# Patient Record
Sex: Female | Born: 1975 | Race: White | Hispanic: No | Marital: Single | Smoking: Current every day smoker
Health system: Southern US, Community
[De-identification: ages and names within clinical notes are randomized; demographics above are authoritative.]

## PROBLEM LIST (undated history)

## (undated) DIAGNOSIS — F419 Anxiety disorder, unspecified: Secondary | ICD-10-CM

## (undated) DIAGNOSIS — R768 Other specified abnormal immunological findings in serum: Secondary | ICD-10-CM

## (undated) DIAGNOSIS — B001 Herpesviral vesicular dermatitis: Secondary | ICD-10-CM

## (undated) DIAGNOSIS — F32A Depression, unspecified: Secondary | ICD-10-CM

## (undated) DIAGNOSIS — D649 Anemia, unspecified: Secondary | ICD-10-CM

## (undated) DIAGNOSIS — G43909 Migraine, unspecified, not intractable, without status migrainosus: Secondary | ICD-10-CM

## (undated) DIAGNOSIS — K219 Gastro-esophageal reflux disease without esophagitis: Secondary | ICD-10-CM

## (undated) DIAGNOSIS — N390 Urinary tract infection, site not specified: Secondary | ICD-10-CM

## (undated) DIAGNOSIS — F329 Major depressive disorder, single episode, unspecified: Secondary | ICD-10-CM

## (undated) HISTORY — DX: Migraine, unspecified, not intractable, without status migrainosus: G43.909

## (undated) HISTORY — DX: Urinary tract infection, site not specified: N39.0

## (undated) HISTORY — PX: COLONOSCOPY: SHX174

## (undated) HISTORY — PX: WRIST SURGERY: SHX841

## (undated) HISTORY — DX: Herpesviral vesicular dermatitis: B00.1

## (undated) HISTORY — PX: UPPER GASTROINTESTINAL ENDOSCOPY: SHX188

## (undated) HISTORY — DX: Depression, unspecified: F32.A

## (undated) HISTORY — DX: Other specified abnormal immunological findings in serum: R76.8

## (undated) HISTORY — DX: Major depressive disorder, single episode, unspecified: F32.9

---

## 2005-01-04 ENCOUNTER — Emergency Department (HOSPITAL_COMMUNITY): Admission: EM | Admit: 2005-01-04 | Discharge: 2005-01-04 | Payer: Self-pay | Admitting: Emergency Medicine

## 2013-07-15 ENCOUNTER — Ambulatory Visit: Payer: Self-pay | Admitting: Internal Medicine

## 2013-09-30 ENCOUNTER — Other Ambulatory Visit (INDEPENDENT_AMBULATORY_CARE_PROVIDER_SITE_OTHER): Payer: No Typology Code available for payment source

## 2013-09-30 ENCOUNTER — Encounter: Payer: Self-pay | Admitting: Internal Medicine

## 2013-09-30 ENCOUNTER — Ambulatory Visit (INDEPENDENT_AMBULATORY_CARE_PROVIDER_SITE_OTHER): Payer: No Typology Code available for payment source | Admitting: Internal Medicine

## 2013-09-30 VITALS — BP 120/80 | HR 75 | Temp 98.8°F | Ht 70.0 in | Wt 144.0 lb

## 2013-09-30 DIAGNOSIS — Z0000 Encounter for general adult medical examination without abnormal findings: Secondary | ICD-10-CM

## 2013-09-30 DIAGNOSIS — F329 Major depressive disorder, single episode, unspecified: Secondary | ICD-10-CM

## 2013-09-30 DIAGNOSIS — B001 Herpesviral vesicular dermatitis: Secondary | ICD-10-CM

## 2013-09-30 DIAGNOSIS — F32A Depression, unspecified: Secondary | ICD-10-CM

## 2013-09-30 DIAGNOSIS — B009 Herpesviral infection, unspecified: Secondary | ICD-10-CM

## 2013-09-30 DIAGNOSIS — F3289 Other specified depressive episodes: Secondary | ICD-10-CM

## 2013-09-30 DIAGNOSIS — G43909 Migraine, unspecified, not intractable, without status migrainosus: Secondary | ICD-10-CM | POA: Insufficient documentation

## 2013-09-30 DIAGNOSIS — R768 Other specified abnormal immunological findings in serum: Secondary | ICD-10-CM

## 2013-09-30 DIAGNOSIS — F419 Anxiety disorder, unspecified: Secondary | ICD-10-CM | POA: Insufficient documentation

## 2013-09-30 DIAGNOSIS — Z0001 Encounter for general adult medical examination with abnormal findings: Secondary | ICD-10-CM | POA: Insufficient documentation

## 2013-09-30 HISTORY — DX: Herpesviral vesicular dermatitis: B00.1

## 2013-09-30 HISTORY — DX: Other specified abnormal immunological findings in serum: R76.8

## 2013-09-30 LAB — CBC WITH DIFFERENTIAL/PLATELET
Basophils Absolute: 0.1 10*3/uL (ref 0.0–0.1)
Basophils Relative: 1.2 % (ref 0.0–3.0)
Eosinophils Absolute: 0.2 10*3/uL (ref 0.0–0.7)
Eosinophils Relative: 3.5 % (ref 0.0–5.0)
HCT: 39.5 % (ref 36.0–46.0)
Hemoglobin: 13.2 g/dL (ref 12.0–15.0)
Lymphocytes Relative: 27.5 % (ref 12.0–46.0)
Lymphs Abs: 1.5 10*3/uL (ref 0.7–4.0)
MCHC: 33.5 g/dL (ref 30.0–36.0)
MCV: 93.3 fl (ref 78.0–100.0)
Monocytes Absolute: 0.7 10*3/uL (ref 0.1–1.0)
Monocytes Relative: 12.5 % — ABNORMAL HIGH (ref 3.0–12.0)
Neutro Abs: 3 10*3/uL (ref 1.4–7.7)
Neutrophils Relative %: 55.3 % (ref 43.0–77.0)
Platelets: 205 10*3/uL (ref 150.0–400.0)
RBC: 4.23 Mil/uL (ref 3.87–5.11)
RDW: 14 % (ref 11.5–15.5)
WBC: 5.4 10*3/uL (ref 4.0–10.5)

## 2013-09-30 LAB — URINALYSIS, ROUTINE W REFLEX MICROSCOPIC
Bilirubin Urine: NEGATIVE
Hgb urine dipstick: NEGATIVE
Ketones, ur: NEGATIVE
Leukocytes, UA: NEGATIVE
Nitrite: NEGATIVE
Specific Gravity, Urine: 1.02 (ref 1.000–1.030)
Urine Glucose: NEGATIVE
Urobilinogen, UA: 1 (ref 0.0–1.0)
pH: 7 (ref 5.0–8.0)

## 2013-09-30 LAB — BASIC METABOLIC PANEL
BUN: 14 mg/dL (ref 6–23)
CO2: 28 mEq/L (ref 19–32)
Calcium: 8.9 mg/dL (ref 8.4–10.5)
Chloride: 104 mEq/L (ref 96–112)
Creatinine, Ser: 0.6 mg/dL (ref 0.4–1.2)
GFR: 117.06 mL/min (ref >60.00)
Glucose, Bld: 71 mg/dL (ref 70–99)
Potassium: 4.9 mEq/L (ref 3.5–5.1)
Sodium: 138 mEq/L (ref 135–145)

## 2013-09-30 LAB — TSH: TSH: 0.91 u[IU]/mL (ref 0.35–4.50)

## 2013-09-30 LAB — LIPID PANEL
Cholesterol: 205 mg/dL — ABNORMAL HIGH (ref 0–200)
HDL: 78.9 mg/dL (ref >39.00)
LDL Cholesterol: 101 mg/dL — ABNORMAL HIGH (ref 0–99)
Total CHOL/HDL Ratio: 3
Triglycerides: 124 mg/dL (ref 0.0–149.0)
VLDL: 24.8 mg/dL (ref 0.0–40.0)

## 2013-09-30 LAB — HEPATIC FUNCTION PANEL
ALT: 12 U/L (ref 0–35)
AST: 17 U/L (ref 0–37)
Albumin: 4.1 g/dL (ref 3.5–5.2)
Alkaline Phosphatase: 47 U/L (ref 39–117)
Bilirubin, Direct: 0.1 mg/dL (ref 0.0–0.3)
Total Bilirubin: 0.2 mg/dL (ref 0.2–1.2)
Total Protein: 6.9 g/dL (ref 6.0–8.3)

## 2013-09-30 MED ORDER — VALACYCLOVIR HCL 1 G PO TABS: 1000.0000 mg | ORAL_TABLET | Freq: Two times a day (BID) | ORAL | Status: DC

## 2013-09-30 NOTE — Patient Instructions (Addendum)
Please take all new medication as prescribed - the valtrex  Please continue all other medications as before, and refills have been done if requested.  Please have the pharmacy call with any other refills you may need.  Please continue your efforts at being more active, low cholesterol diet, and weight control.  You are otherwise up to date with prevention measures today.  You will be contacted regarding the referral for: Counseling  Please call if you feel you need medication for low mood, such as lexapro  Please go to the LAB in the Basement (turn left off the elevator) for the tests to be done today  You will be contacted by phone if any changes need to be made immediately.  Otherwise, you will receive a letter about your results with an explanation, but please check with MyChart first.  Please remember to sign up for MyChart if you have not done so, as this will be important to you in the future with finding out test results, communicating by private email, and scheduling acute appointments online when needed.  Please return in 1 year for your yearly visit, or sooner if needed, with Lab testing done 3-5 days before

## 2013-09-30 NOTE — Progress Notes (Signed)
Pre visit review using our clinic review tool, if applicable. No additional management support is needed unless otherwise documented below in the visit note. 

## 2013-09-30 NOTE — Progress Notes (Signed)
Subjective:    Patient ID: Amanda Murray, female    DOB: July 04, 1975, 38 y.o.   MRN: [id number]  HPI    Here for wellness and f/u;  Overall doing ok;  Pt denies CP, worsening SOB, DOE, wheezing, orthopnea, PND, worsening LE edema, palpitations, dizziness or syncope.  Pt denies neurological change such as new headache, facial or extremity weakness.  Pt denies polydipsia, polyuria, or low sugar symptoms. Pt states overall good compliance with treatment and medications, good tolerability, and has been trying to follow lower cholesterol diet.  Pt denies worsening depressive symptoms, suicidal ideation or panic. No fever, night sweats, wt loss, loss of appetite, or other constitutional symptoms.  Pt states good ability with ADL's, has low fall risk, home safety reviewed and adequate, no other significant changes in hearing or vision.  Has had mild worsening depressive symptoms, but no suicidal ideation, or panic; has ongoing anxiety, not increased recently. Asks for counseling tx, declines meds for now.  Was tx for gastritis H pylori 7 yrs ago Past Medical History  Diagnosis Date  . Depression   . Migraines   . UTI (lower urinary tract infection)    No past surgical history on file.  reports that she has been smoking.  She does not have any smokeless tobacco history on file. She reports that she drinks alcohol. She reports that she does not use illicit drugs. family history includes Alcohol abuse in her other; Diabetes in her other; Hypertension in her other; Mental illness in her other; Sudden death in her other. No Known Allergies No current outpatient prescriptions on file prior to visit.   No current facility-administered medications on file prior to visit.    Review of Systems Constitutional: Negative for increased diaphoresis, other activity, appetite or other siginficant weight change  HENT: Negative for worsening hearing loss, ear pain, facial swelling, mouth sores and neck stiffness.   Eyes:  Negative for other worsening pain, redness or visual disturbance.  Respiratory: Negative for shortness of breath and wheezing.   Cardiovascular: Negative for chest pain and palpitations.  Gastrointestinal: Negative for diarrhea, blood in stool, abdominal distention or other pain Genitourinary: Negative for hematuria, flank pain or change in urine volume.  Musculoskeletal: Negative for myalgias or other joint complaints.  Skin: Negative for color change and wound.  Neurological: Negative for syncope and numbness. other than noted Hematological: Negative for adenopathy. or other swelling Psychiatric/Behavioral: Negative for hallucinations, self-injury, decreased concentration or other worsening agitation.      Objective:   Physical Exam BP 120/80  Pulse 75  Temp(Src) 98.8 F (37.1 C) (Oral)  Ht 5\' 10"  (1.778 m)  Wt 144 lb (65.318 kg)  BMI 20.66 kg/m2  SpO2 97%  LMP 09/17/2013 VS noted,  Constitutional: Pt is oriented to person, place, and time. Appears well-developed and well-nourished.  Head: Normocephalic and atraumatic.  Right Ear: External ear normal.  Left Ear: External ear normal.  Nose: Nose normal.  Mouth/Throat: Oropharynx is clear and moist.  Eyes: Conjunctivae and EOM are normal. Pupils are equal, round, and reactive to light.  Neck: Normal range of motion. Neck supple. No JVD present. No tracheal deviation present.  Cardiovascular: Normal rate, regular rhythm, normal heart sounds and intact distal pulses.   Pulmonary/Chest: Effort normal and breath sounds without rales or wheezing  Abdominal: Soft. Bowel sounds are normal. NT. No HSM  Musculoskeletal: Normal range of motion. Exhibits no edema.  Lymphadenopathy:  Has no cervical adenopathy.  Neurological: Pt is alert and  oriented to person, place, and time. Pt has normal reflexes. No cranial nerve deficit. Motor grossly intact Skin: Skin is warm and dry. No rash noted.  Psychiatric:  Has mild dysphoric mood and affect.  Behavior is normal.          Assessment & Plan:

## 2013-09-30 NOTE — Assessment & Plan Note (Signed)
For valtrex prn 

## 2013-10-01 ENCOUNTER — Telehealth: Payer: Self-pay | Admitting: Internal Medicine

## 2013-10-01 NOTE — Telephone Encounter (Signed)
Relevant patient education mailed to patient.  

## 2014-02-25 ENCOUNTER — Other Ambulatory Visit: Payer: Self-pay

## 2014-03-21 ENCOUNTER — Encounter: Payer: Self-pay | Admitting: Family Medicine

## 2014-03-21 ENCOUNTER — Ambulatory Visit (INDEPENDENT_AMBULATORY_CARE_PROVIDER_SITE_OTHER): Payer: No Typology Code available for payment source | Admitting: Family Medicine

## 2014-03-21 VITALS — BP 100/58 | HR 71 | Temp 98.7°F | Ht 70.0 in | Wt 147.5 lb

## 2014-03-21 DIAGNOSIS — R300 Dysuria: Secondary | ICD-10-CM

## 2014-03-21 LAB — POCT URINALYSIS DIPSTICK
Bilirubin, UA: NEGATIVE
Blood, UA: NEGATIVE
Ketones, UA: NEGATIVE
Nitrite, UA: POSITIVE
Spec Grav, UA: 1.015
Urobilinogen, UA: 1
pH, UA: 7

## 2014-03-21 MED ORDER — CIPROFLOXACIN HCL 500 MG PO TABS: 500.0000 mg | ORAL_TABLET | Freq: Two times a day (BID) | ORAL | Status: DC

## 2014-03-21 NOTE — Progress Notes (Signed)
Pre visit review using our clinic review tool, if applicable. No additional management support is needed unless otherwise documented below in the visit note. 

## 2014-03-21 NOTE — Progress Notes (Signed)
  HPI:  Acute visit for:  1) Dysuria: -started yesterday -symptoms: urgency, frequency, dysuria -denies: flank pain, vomiting, fevers, chills, vaginal symptoms, hematuria -azo helped -also battling a cold  ROS: See pertinent positives and negatives per HPI.  Past Medical History  Diagnosis Date  . Depression   . Migraines   . UTI (lower urinary tract infection)   . Helicobacter pylori ab+ 09/30/2013    Treated approx 2008  . 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  Family History  Problem Relation Age of Onset  . Alcohol abuse Other   . Mental illness Other   . Sudden death Other   . Hypertension Other   . Diabetes Other     History   Social History  . Marital Status: Single    Spouse Name: N/A    Number of Children: N/A  . Years of Education: college   Occupational History  . Hair Stylist    Social History Main Topics  . Smoking status: Current Every Day Smoker  . Smokeless tobacco: None  . Alcohol Use: Yes  . Drug Use: No  . Sexual Activity: None   Other Topics Concern  . None   Social History Narrative    Current outpatient prescriptions: LYSINE PO, Take by mouth., Disp: , Rfl: ;  Multiple Vitamin (MULTIVITAMIN) capsule, Take 1 capsule by mouth daily., Disp: , Rfl: ;  valACYclovir (VALTREX) 1000 MG tablet, Take 1 tablet (1,000 mg total) by mouth 2 (two) times daily., Disp: 14 tablet, Rfl: 3;  ciprofloxacin (CIPRO) 500 MG tablet, Take 1 tablet (500 mg total) by mouth 2 (two) times daily., Disp: 6 tablet, Rfl: 0  EXAM:  Filed Vitals:   03/21/14 1426  BP: 100/58  Pulse: 71  Temp: 98.7 F (37.1 C)    Body mass index is 21.16 kg/(m^2).  GENERAL: vitals reviewed and listed above, alert, oriented, appears well hydrated and in no acute distress  HEENT: atraumatic, conjunttiva clear, no obvious abnormalities on inspection of external nose and ears  NECK: no obvious masses on inspection  LUNGS: clear to auscultation  bilaterally, no wheezes, rales or rhonchi, good air movement  CV: HRRR, no peripheral edema  ABD: no CVA TTP  MS: moves all extremities without noticeable abnormality  PSYCH: pleasant and cooperative, no obvious depression or anxiety  ASSESSMENT AND PLAN:  Discussed the following assessment and plan:  Dysuria - Plan: POC Urinalysis Dipstick, ciprofloxacin (CIPRO) 500 MG tablet  -udip with 3+ leuks and nit +, tx empirically with abx -follow up with PCP if persistent symptoms -Patient advised to return or notify a doctor immediately if symptoms worsen or persist or new concerns arise.  There are no Patient Instructions on file for this visit.   Colin Benton R.

## 2014-03-22 ENCOUNTER — Telehealth: Payer: Self-pay | Admitting: Family Medicine

## 2014-03-22 NOTE — Telephone Encounter (Signed)
emmi emailed °

## 2015-09-06 ENCOUNTER — Encounter: Payer: No Typology Code available for payment source | Admitting: Internal Medicine

## 2015-09-20 DIAGNOSIS — N920 Excessive and frequent menstruation with regular cycle: Secondary | ICD-10-CM | POA: Diagnosis not present

## 2015-09-26 ENCOUNTER — Encounter: Payer: No Typology Code available for payment source | Admitting: Internal Medicine

## 2015-10-10 ENCOUNTER — Other Ambulatory Visit (INDEPENDENT_AMBULATORY_CARE_PROVIDER_SITE_OTHER): Payer: BLUE CROSS/BLUE SHIELD

## 2015-10-10 ENCOUNTER — Other Ambulatory Visit: Payer: Self-pay | Admitting: Internal Medicine

## 2015-10-10 ENCOUNTER — Encounter: Payer: Self-pay | Admitting: Internal Medicine

## 2015-10-10 ENCOUNTER — Ambulatory Visit (INDEPENDENT_AMBULATORY_CARE_PROVIDER_SITE_OTHER): Payer: BLUE CROSS/BLUE SHIELD | Admitting: Internal Medicine

## 2015-10-10 VITALS — BP 122/76 | HR 75 | Temp 98.3°F | Resp 20 | Wt 144.0 lb

## 2015-10-10 DIAGNOSIS — Z720 Tobacco use: Secondary | ICD-10-CM

## 2015-10-10 DIAGNOSIS — Z0001 Encounter for general adult medical examination with abnormal findings: Secondary | ICD-10-CM | POA: Diagnosis not present

## 2015-10-10 DIAGNOSIS — F32A Depression, unspecified: Secondary | ICD-10-CM

## 2015-10-10 DIAGNOSIS — R6889 Other general symptoms and signs: Secondary | ICD-10-CM

## 2015-10-10 DIAGNOSIS — Z202 Contact with and (suspected) exposure to infections with a predominantly sexual mode of transmission: Secondary | ICD-10-CM | POA: Diagnosis not present

## 2015-10-10 DIAGNOSIS — F329 Major depressive disorder, single episode, unspecified: Secondary | ICD-10-CM

## 2015-10-10 DIAGNOSIS — B001 Herpesviral vesicular dermatitis: Secondary | ICD-10-CM | POA: Diagnosis not present

## 2015-10-10 DIAGNOSIS — L989 Disorder of the skin and subcutaneous tissue, unspecified: Secondary | ICD-10-CM

## 2015-10-10 DIAGNOSIS — F17200 Nicotine dependence, unspecified, uncomplicated: Secondary | ICD-10-CM

## 2015-10-10 LAB — CBC WITH DIFFERENTIAL/PLATELET
Basophils Absolute: 0 10*3/uL (ref 0.0–0.1)
Basophils Relative: 0.7 % (ref 0.0–3.0)
Eosinophils Absolute: 0.2 10*3/uL (ref 0.0–0.7)
Eosinophils Relative: 4.6 % (ref 0.0–5.0)
HCT: 40 % (ref 36.0–46.0)
Hemoglobin: 13.7 g/dL (ref 12.0–15.0)
Lymphocytes Relative: 31.3 % (ref 12.0–46.0)
Lymphs Abs: 1.2 10*3/uL (ref 0.7–4.0)
MCHC: 34.2 g/dL (ref 30.0–36.0)
MCV: 92.7 fl (ref 78.0–100.0)
Monocytes Absolute: 0.6 10*3/uL (ref 0.1–1.0)
Monocytes Relative: 13.8 % — ABNORMAL HIGH (ref 3.0–12.0)
Neutro Abs: 2 10*3/uL (ref 1.4–7.7)
Neutrophils Relative %: 49.6 % (ref 43.0–77.0)
Platelets: 224 10*3/uL (ref 150.0–400.0)
RBC: 4.31 Mil/uL (ref 3.87–5.11)
RDW: 14.8 % (ref 11.5–15.5)
WBC: 4 10*3/uL (ref 4.0–10.5)

## 2015-10-10 LAB — URINALYSIS, ROUTINE W REFLEX MICROSCOPIC
Bilirubin Urine: NEGATIVE
Hgb urine dipstick: NEGATIVE
Ketones, ur: 15 — AB
Leukocytes, UA: NEGATIVE
Nitrite: NEGATIVE
Specific Gravity, Urine: 1.02 (ref 1.000–1.030)
Total Protein, Urine: NEGATIVE
Urine Glucose: NEGATIVE
Urobilinogen, UA: 0.2 (ref 0.0–1.0)
pH: 6.5 (ref 5.0–8.0)

## 2015-10-10 LAB — HEPATIC FUNCTION PANEL
ALT: 17 U/L (ref 0–35)
AST: 24 U/L (ref 0–37)
Albumin: 4.5 g/dL (ref 3.5–5.2)
Alkaline Phosphatase: 42 U/L (ref 39–117)
Bilirubin, Direct: 0.1 mg/dL (ref 0.0–0.3)
Total Bilirubin: 0.4 mg/dL (ref 0.2–1.2)
Total Protein: 6.8 g/dL (ref 6.0–8.3)

## 2015-10-10 LAB — LIPID PANEL
Cholesterol: 181 mg/dL (ref 0–200)
HDL: 73.8 mg/dL (ref >39.00)
LDL Cholesterol: 88 mg/dL (ref 0–99)
NonHDL: 107.37
Total CHOL/HDL Ratio: 2
Triglycerides: 97 mg/dL (ref 0.0–149.0)
VLDL: 19.4 mg/dL (ref 0.0–40.0)

## 2015-10-10 LAB — BASIC METABOLIC PANEL
BUN: 11 mg/dL (ref 6–23)
CO2: 29 mEq/L (ref 19–32)
Calcium: 9.2 mg/dL (ref 8.4–10.5)
Chloride: 107 mEq/L (ref 96–112)
Creatinine, Ser: 0.58 mg/dL (ref 0.40–1.20)
GFR: 122.75 mL/min (ref >60.00)
Glucose, Bld: 76 mg/dL (ref 70–99)
Potassium: 4.2 mEq/L (ref 3.5–5.1)
Sodium: 141 mEq/L (ref 135–145)

## 2015-10-10 LAB — TSH: TSH: 1.18 u[IU]/mL (ref 0.35–4.50)

## 2015-10-10 LAB — HIV ANTIBODY (ROUTINE TESTING W REFLEX): HIV 1&2 Ab, 4th Generation: NONREACTIVE

## 2015-10-10 MED ORDER — VALACYCLOVIR HCL 1 G PO TABS: 1000.0000 mg | ORAL_TABLET | Freq: Two times a day (BID) | ORAL | Status: DC

## 2015-10-10 MED ORDER — BUPROPION HCL ER (XL) 150 MG PO TB24: 150.0000 mg | ORAL_TABLET | Freq: Every day | ORAL | Status: DC

## 2015-10-10 NOTE — Progress Notes (Signed)
Subjective:    Patient ID: Amanda Murray, female    DOB: 1976/04/20, 40 y.o.   MRN: [id number]  HPI    Here for wellness and f/u;  Overall doing ok;  Pt denies Chest pain, worsening SOB, DOE, wheezing, orthopnea, PND, worsening LE edema, palpitations, dizziness or syncope.  Pt denies neurological change such as new headache, facial or extremity weakness.  Pt denies polydipsia, polyuria, or low sugar symptoms. Pt states overall good compliance with treatment and medications, good tolerability, and has been trying to follow appropriate diet.  Pt denies worsening depressive symptoms, suicidal ideation or panic. No fever, night sweats, wt loss, loss of appetite, or other constitutional symptoms.  Pt states good ability with ADL's, has low fall risk, home safety reviewed and adequate, no other significant changes in hearing or vision, and only occasionally active with exercise.     Had an unusual for her sinus infection for a week a few months ago.  Has a cold sore in the past few days, asks for valtrex prn refill.  Trying to quit smoking, has tried accupuncture and hypnosis and several OTC.  Asks for STD testing as she is in a new relationship.  Also has a skin lesion of the right lower back increased size and slightly raised in the past yr.  Denies urinary symptoms such as dysuria, frequency, urgency, flank pain, hematuria or n/v, fever, chills.  Denies worsening reflux, abd pain, dysphagia, n/v, bowel change or blood.  Not pregnant, LMP start today  Denies worsening depressive symptoms, suicidal ideation, or panic Past Medical History  Diagnosis Date  . Depression   . Migraines   . UTI (lower urinary tract infection)   . Helicobacter pylori ab+ 09/30/2013    Treated approx 2008  . 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  reports that she has been smoking.  She does not have any smokeless tobacco history on file. She reports that she drinks alcohol. She reports that she does not  use illicit drugs. family history includes Alcohol abuse in her other; Diabetes in her other; Hypertension in her other; Mental illness in her other; Sudden death in her other. No Known Allergies Current Outpatient Prescriptions on File Prior to Visit  Medication Sig Dispense Refill  . LYSINE PO Take by mouth.    . Multiple Vitamin (MULTIVITAMIN) capsule Take 1 capsule by mouth daily.     No current facility-administered medications on file prior to visit.   Review of Systems Constitutional: Negative for increased diaphoresis, or other activity, appetite or siginficant weight change other than noted HENT: Negative for worsening hearing loss, ear pain, facial swelling, mouth sores and neck stiffness.   Eyes: Negative for other worsening pain, redness or visual disturbance.  Respiratory: Negative for choking or stridor Cardiovascular: Negative for other chest pain and palpitations.  Gastrointestinal: Negative for worsening diarrhea, blood in stool, or abdominal distention Genitourinary: Negative for hematuria, flank pain or change in urine volume.  Musculoskeletal: Negative for myalgias or other joint complaints.  Skin: Negative for other color change and wound or drainage.  Neurological: Negative for syncope and numbness. other than noted Hematological: Negative for adenopathy. or other swelling Psychiatric/Behavioral: Negative for hallucinations, SI, self-injury, decreased concentration or other worsening agitation. '    Objective:   Physical Exam BP 122/76 mmHg  Pulse 75  Temp(Src) 98.3 F (36.8 C) (Oral)  Resp 20  Wt 144 lb (65.318 kg)  SpO2 98% VS noted,  Constitutional: Pt  is oriented to person, place, and time. Appears well-developed and well-nourished, in no significant distress Head: Normocephalic and atraumatic  Eyes: Conjunctivae and EOM are normal. Pupils are equal, round, and reactive to light Right Ear: External ear normal.  Left Ear: External ear normal Nose: Nose  normal.  Mouth/Throat: Oropharynx is clear and moist  Neck: Normal range of motion. Neck supple. No JVD present. No tracheal deviation present or significant neck LA or mass Cardiovascular: Normal rate, regular rhythm, normal heart sounds and intact distal pulses.   Pulmonary/Chest: Effort normal and breath sounds without rales or wheezing  Abdominal: Soft. Bowel sounds are normal. NT. No HSM  Musculoskeletal: Normal range of motion. Exhibits no edema Lymphadenopathy: Has no cervical adenopathy.  Neurological: Pt is alert and oriented to person, place, and time. Pt has normal reflexes. No cranial nerve deficit. Motor grossly intact Skin: Skin is warm and dry. No rash noted or new ulcers, 10 mm slight raised right lower back lesion nonulcerated nonvesicular, small cod sore o right upper lip mild tender Psychiatric:  Has normal mood and affect. Behavior is normal. Not depressed affect    Assessment & Plan:

## 2015-10-10 NOTE — Patient Instructions (Addendum)
Please take all new medication as prescribed- the wellbutrin  Please continue all other medications as before, and refills have been done if requested - the valtrex  Please have the pharmacy call with any other refills you may need.  Please continue your efforts at being more active, low cholesterol diet, and weight control.  You are otherwise up to date with prevention measures today.  You will be contacted regarding the referral for: Dermatology  Please keep your appointments with your specialists as you may have planned  Please go to the LAB in the Basement (turn left off the elevator) for the tests to be done today  You will be contacted by phone if any changes need to be made immediately.  Otherwise, you will receive a letter about your results with an explanation, but please check with MyChart first.  Please remember to sign up for MyChart if you have not done so, as this will be important to you in the future with finding out test results, communicating by private email, and scheduling acute appointments online when needed.  Please return in 1 year for your yearly visit, or sooner if needed

## 2015-10-10 NOTE — Progress Notes (Signed)
Pre visit review using our clinic review tool, if applicable. No additional management support is needed unless otherwise documented below in the visit note. 

## 2015-10-11 LAB — HEPATITIS PANEL, ACUTE
HCV Ab: NEGATIVE
Hep A IgM: NONREACTIVE
Hep B C IgM: NONREACTIVE
Hepatitis B Surface Ag: NEGATIVE

## 2015-10-11 LAB — HSV 2 ANTIBODY, IGG: HSV 2 Glycoprotein G Ab, IgG: <0.9 Index (ref <0.90)

## 2015-10-11 LAB — GC/CHLAMYDIA PROBE AMP
CT Probe RNA: NOT DETECTED
GC Probe RNA: NOT DETECTED

## 2015-10-11 LAB — RPR

## 2015-10-13 ENCOUNTER — Encounter: Payer: No Typology Code available for payment source | Admitting: Internal Medicine

## 2015-10-15 NOTE — Assessment & Plan Note (Signed)
D/w pt options of wellbutrin and chantix, will try wellbutrin asd,  to f/u any worsening symptoms or concerns

## 2015-10-15 NOTE — Assessment & Plan Note (Addendum)
With recent enlarging, slight raised, ok for derm referral r/o malignant  In addition to the time spent performing CPE, I spent an additional 25 minutes face to face,in which greater than 50% of this time was spent in counseling and coordination of care for patient's acute illness as documented.

## 2015-10-15 NOTE — Assessment & Plan Note (Signed)
stable overall by history and exam, and pt to continue medical treatment as before,  to f/u any worsening symptoms or concerns 

## 2015-10-15 NOTE — Assessment & Plan Note (Signed)

## 2015-10-15 NOTE — Assessment & Plan Note (Signed)
Pt asympt, for testing as requested,  to f/u any worsening symptoms or concerns

## 2015-10-15 NOTE — Assessment & Plan Note (Signed)
Mild to mod, for antibx course - valtrex with refills, e to f/u any worsening symptoms or concerns

## 2015-11-16 DIAGNOSIS — D235 Other benign neoplasm of skin of trunk: Secondary | ICD-10-CM | POA: Diagnosis not present

## 2015-11-16 DIAGNOSIS — L814 Other melanin hyperpigmentation: Secondary | ICD-10-CM | POA: Diagnosis not present

## 2016-02-22 ENCOUNTER — Encounter: Payer: Self-pay | Admitting: Internal Medicine

## 2016-02-22 MED ORDER — VALACYCLOVIR HCL 1 G PO TABS: 1000.0000 mg | ORAL_TABLET | Freq: Two times a day (BID) | ORAL | 3 refills | Status: DC

## 2016-06-16 ENCOUNTER — Encounter: Payer: Self-pay | Admitting: Internal Medicine

## 2016-06-17 ENCOUNTER — Ambulatory Visit (INDEPENDENT_AMBULATORY_CARE_PROVIDER_SITE_OTHER): Payer: BLUE CROSS/BLUE SHIELD | Admitting: Sports Medicine

## 2016-06-17 ENCOUNTER — Ambulatory Visit (INDEPENDENT_AMBULATORY_CARE_PROVIDER_SITE_OTHER): Payer: BLUE CROSS/BLUE SHIELD

## 2016-06-17 ENCOUNTER — Encounter: Payer: Self-pay | Admitting: Sports Medicine

## 2016-06-17 ENCOUNTER — Ambulatory Visit: Payer: Self-pay

## 2016-06-17 VITALS — BP 124/84 | HR 88 | Ht 70.75 in | Wt 144.2 lb

## 2016-06-17 DIAGNOSIS — M25062 Hemarthrosis, left knee: Secondary | ICD-10-CM | POA: Diagnosis not present

## 2016-06-17 DIAGNOSIS — M25562 Pain in left knee: Secondary | ICD-10-CM

## 2016-06-17 DIAGNOSIS — M25462 Effusion, left knee: Secondary | ICD-10-CM | POA: Diagnosis not present

## 2016-06-17 MED ORDER — IBUPROFEN 600 MG PO TABS: 600.0000 mg | ORAL_TABLET | Freq: Three times a day (TID) | ORAL | 0 refills | Status: DC | PRN

## 2016-06-17 MED ORDER — TRAMADOL HCL 50 MG PO TABS: 50.0000 mg | ORAL_TABLET | Freq: Four times a day (QID) | ORAL | 0 refills | Status: DC | PRN

## 2016-06-17 NOTE — Patient Instructions (Signed)
Please keep your weight off of your knee until we see the MRI results
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
## 2016-06-17 NOTE — Assessment & Plan Note (Signed)
Marked pain with hemarthrosis concerning for potential nondisplaced tibial plateau fracture versus possible anterior cruciate ligament rupture.  Given the possibility of occult fracture nonweightbearing status crutches prescribed for the patient today.  MRI ordered and will be scheduled for tomorrow.  Tramadol and ibuprofen prescribed.  Frequent icing recommended.  Consider prescription for knee immobilizer but will defer until further diagnostic imaging obtained.  4 inch Ace wrap applied today.

## 2016-06-18 ENCOUNTER — Ambulatory Visit
Admission: RE | Admit: 2016-06-18 | Discharge: 2016-06-18 | Disposition: A | Discharge status: Home / Self Care | Payer: BLUE CROSS/BLUE SHIELD | Source: Ambulatory Visit | Attending: Sports Medicine | Admitting: Sports Medicine

## 2016-06-18 DIAGNOSIS — M25562 Pain in left knee: Secondary | ICD-10-CM | POA: Diagnosis not present

## 2016-06-18 DIAGNOSIS — M25062 Hemarthrosis, left knee: Secondary | ICD-10-CM

## 2016-06-21 ENCOUNTER — Ambulatory Visit: Payer: BLUE CROSS/BLUE SHIELD | Admitting: Sports Medicine

## 2016-06-24 ENCOUNTER — Encounter: Payer: Self-pay | Admitting: Sports Medicine

## 2016-06-24 ENCOUNTER — Ambulatory Visit: Payer: BLUE CROSS/BLUE SHIELD | Admitting: Sports Medicine

## 2016-06-24 ENCOUNTER — Ambulatory Visit (INDEPENDENT_AMBULATORY_CARE_PROVIDER_SITE_OTHER): Payer: BLUE CROSS/BLUE SHIELD | Admitting: Sports Medicine

## 2016-06-24 VITALS — BP 112/76 | HR 102 | Ht 70.75 in | Wt 143.2 lb

## 2016-06-24 DIAGNOSIS — S83512D Sprain of anterior cruciate ligament of left knee, subsequent encounter: Secondary | ICD-10-CM | POA: Diagnosis not present

## 2016-06-24 DIAGNOSIS — S83512A Sprain of anterior cruciate ligament of left knee, initial encounter: Secondary | ICD-10-CM | POA: Insufficient documentation

## 2016-06-24 MED ORDER — NAPROXEN 500 MG PO TABS: 500.0000 mg | ORAL_TABLET | Freq: Two times a day (BID) | ORAL | 0 refills | Status: DC

## 2016-06-24 NOTE — Assessment & Plan Note (Addendum)
We'll begin with appropriate therapeutic exercises and discussed possibility of operative versus nonoperative intervention.  Cautioned on mechanical symptoms and avoiding recurrent injury.  Patient is able to perform straight leg raise without significant difficulty.  Amanda Murray still is a small effusion and I like for her to continue wearing compression.  Body helix compression sleeve recommended.  HEP: Reviewed and a detail with athletic training staff focusing on VMO strengthening as well as hamstring strengthening.  Range of motion encouraged.  Will try to attempt to return to work in one week.  Amanda Murray may call if Amanda Murray is having any ongoing issues and would like more time away from work.

## 2016-06-24 NOTE — Patient Instructions (Signed)
Please perform the exercise program that Jeneen Rinks has prepared for you and gone over in detail on a daily basis.  In addition to the handout you were provided you can access your program through: [email or web address]   Your unique program code is: [id number]
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
## 2016-07-15 ENCOUNTER — Ambulatory Visit (INDEPENDENT_AMBULATORY_CARE_PROVIDER_SITE_OTHER): Payer: BLUE CROSS/BLUE SHIELD | Admitting: Sports Medicine

## 2016-07-15 ENCOUNTER — Encounter: Payer: Self-pay | Admitting: Sports Medicine

## 2016-07-15 VITALS — BP 128/82 | HR 90 | Ht 70.5 in | Wt 145.4 lb

## 2016-07-15 DIAGNOSIS — S83512D Sprain of anterior cruciate ligament of left knee, subsequent encounter: Secondary | ICD-10-CM | POA: Diagnosis not present

## 2016-07-15 DIAGNOSIS — M25562 Pain in left knee: Secondary | ICD-10-CM

## 2016-07-15 NOTE — Progress Notes (Signed)
Amanda Murray - 41 y.o. female MRN [id number]  Date of birth: 05-Feb-1976  Office Visit Note: Visit Date: 07/15/2016 PCP: Cathlean Cower, MD Referred by: Biagio Borg, MD  Subjective: Chief Complaint  Patient presents with  . Knee Injury    left   HPI: Patient is here for follow-up of left ACL injury.  She is essentially 4-1/2 weeks out from date of injury and reports overall good week to week improvement.  She denies any worsening mechanical symptoms but does have occasional buckling of her knee without associated falls.  She has been able to return to work but has some discomfort towards the end of the day.  Occasional pain with twisting mechanism but this is minimal and is happening less frequently.  She is performing therapeutic exercises on a regular basis.  She is using an over-the-counter Mueller brace and reports this feels as though it is giving her adequate stability. ROS: Otherwise per HPI.  Objective:  VS:  HT:5' 10.5" (179.1 cm)   WT:145 lb 6.4 oz (66 kg)  BMI:20.6    BP:128/82  HR:90bpm  TEMP: ( )  RESP:97 % Physical Exam: GENERAL:  WDWN, NAD, Non-toxic appearing PSYCH:  Alert & appropriately interactive  Not depressed or anxious appearing LOWER EXTREMITIES:  No significant rashes/lesions/ulcerations overlying the legs.  No significant pretibial edema.  No clubbing or cyanosis.  DP & PT pulses 2+/4.  Sensation intact to light touch. LEFT KNEE:  Overall joint is well aligned, no significant deformity.    No significant effusion.  Small amount of generalized synovitis.  ROM: 0 to 120.    Extensor mechanism intact  No significant medial or lateral joint line tenderness.    Stable to varus/valgus strain.  Hypermobile with Lachman's and anterior drawer testing.  Stable posterior drawer..    Negative McMurray's and Thessaly.    She has overall good improvement in her knee tracking.   Imaging & Procedures: No results found.   Assessment &  Plan: Problem List Items Addressed This Visit    Left anterior cruciate ligament tear - Primary    She is making good progress.  Her mechanical symptoms have significantly improved over the past several weeks and she continues to feel that she is making good progress.  She should continue with therapeutic exercises as well as with avoidance of dynamic motions.  We discussed the indications for operative intervention versus nonoperative and she wishes to pursue with nonoperative management.  We will plan to follow-up with her in 6 weeks to ensure that she is continuing to improve and if any lack of improvement at that time she will call to discuss.  Regarding the underlying degenerative changes that she has which are minimal we encouraged her to continue working on strengthening therapeutic exercises and remaining active.  >50% of this 25 minute visit spent in direct patient counseling and/or coordination of care.  Discussion was focused on education regarding the in discussing the pathoetiology and anticipated clinical course of the above condition.       Other Visit Diagnoses    Acute pain of left knee          Follow-up: Return in about 6 weeks (around 08/26/2016).   Past Medical/Family/Surgical/Social History: Medications & Allergies reviewed per EMR Patient Active Problem List   Diagnosis Date Noted  . Left anterior cruciate ligament tear 06/24/2016  . Hemarthrosis of left knee 06/17/2016  . Possible exposure to STD 10/10/2015  . Smoker 10/10/2015  . Skin lesion  of back 10/10/2015  . Encounter for preventative adult health care exam with abnormal findings 09/30/2013  . Helicobacter pylori ab+ 09/30/2013  . 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 Depression   . Migraines    Past Medical History:  Diagnosis Date  . Depression   . Helicobacter pylori ab+ 09/30/2013   Treated approx 2008  . Migraines   . 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 UTI (lower urinary tract infection)    Family  History  Problem Relation Age of Onset  . Alcohol abuse Other   . Mental illness Other   . Sudden death Other   . Hypertension Other   . Diabetes Other    No past surgical history on file. Social History   Occupational History  . Hair Stylist    Social History Main Topics  . Smoking status: Current Every Day Smoker  . Smokeless tobacco: Never Used  . Alcohol use Yes  . Drug use: No  . Sexual activity: Not on file

## 2016-07-16 NOTE — Assessment & Plan Note (Addendum)
She is making good progress.  Her mechanical symptoms have significantly improved over the past several weeks and she continues to feel that she is making good progress.  She should continue with therapeutic exercises as well as with avoidance of dynamic motions.  We discussed the indications for operative intervention versus nonoperative and she wishes to pursue with nonoperative management.  We will plan to follow-up with her in 6 weeks to ensure that she is continuing to improve and if any lack of improvement at that time she will call to discuss.  Regarding the underlying degenerative changes that she has which are minimal we encouraged her to continue working on strengthening therapeutic exercises and remaining active.  >50% of this 25 minute visit spent in direct patient counseling and/or coordination of care.  Discussion was focused on education regarding the in discussing the pathoetiology and anticipated clinical course of the above condition.

## 2016-08-09 ENCOUNTER — Encounter: Payer: Self-pay | Admitting: Internal Medicine

## 2016-08-12 MED ORDER — VALACYCLOVIR HCL 1 G PO TABS: 1000.0000 mg | ORAL_TABLET | Freq: Two times a day (BID) | ORAL | 0 refills | Status: DC

## 2016-08-16 ENCOUNTER — Telehealth: Payer: Self-pay | Admitting: Sports Medicine

## 2016-08-16 NOTE — Telephone Encounter (Signed)
Patient is scheduled for a 6 week f/u on the 16th, but is concerned there is something wrong with her knee that needs to be seen sooner. Advised to speak with you first and see how you wanted to handle it and go from there.

## 2016-08-16 NOTE — Telephone Encounter (Signed)
Pt has r/s to 08/22/16.

## 2016-08-16 NOTE — Telephone Encounter (Signed)
Spoke with patient. She says that this week was her first full week (4 days in a row) back to work. She is concerned because she barely made it through 4 days of work. She has been doing PT and exercises but she thinks that her type of job makes it a struggle for her knee. She says that she needs to be able to work because this is her prime season. She wants to know if she could get another injection or what her options are. She says that by the end of the day her knee is really hurting her. She ices her knee at the end of the day but she does a lot of up and down with her job so its really tough on her knees. Yesterday she felt a burning sensation from her knee up into the thigh.

## 2016-08-22 ENCOUNTER — Ambulatory Visit: Payer: BLUE CROSS/BLUE SHIELD | Admitting: Sports Medicine

## 2016-08-26 ENCOUNTER — Ambulatory Visit: Payer: BLUE CROSS/BLUE SHIELD | Admitting: Sports Medicine

## 2016-08-27 ENCOUNTER — Ambulatory Visit (INDEPENDENT_AMBULATORY_CARE_PROVIDER_SITE_OTHER): Payer: BLUE CROSS/BLUE SHIELD | Admitting: Sports Medicine

## 2016-08-27 ENCOUNTER — Encounter: Payer: Self-pay | Admitting: Sports Medicine

## 2016-08-27 VITALS — BP 120/76 | HR 76 | Ht 70.5 in | Wt 148.7 lb

## 2016-08-27 DIAGNOSIS — S83512D Sprain of anterior cruciate ligament of left knee, subsequent encounter: Secondary | ICD-10-CM

## 2016-08-27 NOTE — Patient Instructions (Signed)
We will reach out to our [employer] [facility] about getting an ACL brace for you.  Please keep doing her exercises and we will follow-up at next visit.
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
## 2016-08-28 NOTE — Assessment & Plan Note (Signed)
Subjective Report:  Frustrated with her progress but denies any giving way, locking or mechanical symptoms on a daily basis.  Pain is her major complaint  Single episode several weeks ago of searing pain into the anterior thigh after stepping in an awkward way while at work at the end of the day.  Pain is worse with fatigue     Assessment & Plan & Follow up Issues:  Chronic, improving condition  ->50% of this 25 minute visit spent in direct patient counseling and/or coordination of care.  Discussion was focused on education regarding the in discussing the pathoetiology and anticipated clinical course of the above condition.  Ultimately she does continue to show improvement as expected along the timeframe of ACL recovery.  She is not having any routine giving way but is having irritation throughout the day.   1. Therapeutic exercises emphasized to be performed on a daily basis.  Formal PT declined. 2. 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  Referral placed to [facility]

## 2016-09-04 DIAGNOSIS — S83512D Sprain of anterior cruciate ligament of left knee, subsequent encounter: Secondary | ICD-10-CM | POA: Diagnosis not present

## 2016-09-10 DIAGNOSIS — Z6820 Body mass index (BMI) 20.0-20.9, adult: Secondary | ICD-10-CM | POA: Diagnosis not present

## 2016-09-10 DIAGNOSIS — Z113 Encounter for screening for infections with a predominantly sexual mode of transmission: Secondary | ICD-10-CM | POA: Diagnosis not present

## 2016-09-10 DIAGNOSIS — Z01419 Encounter for gynecological examination (general) (routine) without abnormal findings: Secondary | ICD-10-CM | POA: Diagnosis not present

## 2016-09-10 DIAGNOSIS — Z1159 Encounter for screening for other viral diseases: Secondary | ICD-10-CM | POA: Diagnosis not present

## 2016-09-10 DIAGNOSIS — Z1231 Encounter for screening mammogram for malignant neoplasm of breast: Secondary | ICD-10-CM | POA: Diagnosis not present

## 2016-09-10 DIAGNOSIS — Z1151 Encounter for screening for human papillomavirus (HPV): Secondary | ICD-10-CM | POA: Diagnosis not present

## 2016-09-10 DIAGNOSIS — Z114 Encounter for screening for human immunodeficiency virus [HIV]: Secondary | ICD-10-CM | POA: Diagnosis not present

## 2016-09-11 ENCOUNTER — Encounter: Payer: Self-pay | Admitting: Internal Medicine

## 2016-09-11 MED ORDER — VALACYCLOVIR HCL 1 G PO TABS: 1000.0000 mg | ORAL_TABLET | Freq: Two times a day (BID) | ORAL | 1 refills | Status: DC

## 2016-09-11 NOTE — Telephone Encounter (Signed)
Ok for valtrex asd,   Due for ROV soon, thanks

## 2016-10-08 ENCOUNTER — Ambulatory Visit: Payer: BLUE CROSS/BLUE SHIELD | Admitting: Sports Medicine

## 2016-10-08 ENCOUNTER — Encounter: Payer: Self-pay | Admitting: Sports Medicine

## 2016-11-11 ENCOUNTER — Ambulatory Visit: Payer: BLUE CROSS/BLUE SHIELD | Admitting: Psychology

## 2016-12-11 ENCOUNTER — Encounter: Payer: BLUE CROSS/BLUE SHIELD | Admitting: Internal Medicine

## 2016-12-16 ENCOUNTER — Encounter: Payer: Self-pay | Admitting: Sports Medicine

## 2016-12-16 ENCOUNTER — Ambulatory Visit (INDEPENDENT_AMBULATORY_CARE_PROVIDER_SITE_OTHER): Payer: BLUE CROSS/BLUE SHIELD | Admitting: Sports Medicine

## 2016-12-16 VITALS — BP 140/80 | HR 100 | Ht 70.5 in | Wt 141.0 lb

## 2016-12-16 DIAGNOSIS — S83512D Sprain of anterior cruciate ligament of left knee, subsequent encounter: Secondary | ICD-10-CM

## 2016-12-16 MED ORDER — NAPROXEN-ESOMEPRAZOLE 500-20 MG PO TBEC: 1.0000 | DELAYED_RELEASE_TABLET | Freq: Two times a day (BID) | ORAL | 1 refills | Status: DC

## 2016-12-16 NOTE — Progress Notes (Signed)
OFFICE VISIT NOTE Amanda Murray. Amanda Murray, [facility]  Amanda Murray - 41 y.o. female MRN [id number]  Date of birth: 1976/04/18  Visit Date: 12/16/2016  PCP: Biagio Borg, MD   Referred by: Biagio Borg, MD  Jari Sportsman, cma acting as scribe for Dr. Paulla Fore.  SUBJECTIVE:   Chief Complaint  Patient presents with  . Follow-up ACL Ruputure Left Knee   HPI: As below and per problem based documentation when appropriate.   Lasheena reports improvement in her Left knee pain since last OV. The persistent pain she has is in the posterior aspect of knee. Associated sx is swelling. She is wearing Body Helix for compression, using ice and elevating with some relief. Reports a new fall on 12/14/2016, tripped over her dog where she tried to catch herself and twisted her knee. She is not using Naproxen but is using Ibuprofen. Movement triggers her sx and after sitting for long periods of time. MRI done on 06/18/2016.    ROS  Otherwise per HPI.  HISTORY & PERTINENT PRIOR DATA:  No specialty comments available. She reports that she has been smoking.  She has never used smokeless tobacco. No results for input(s): HGBA1C, LABURIC in the last 8760 hours. Medications & Allergies reviewed per EMR Patient Active Problem List   Diagnosis Date Noted  . Panic attacks 12/18/2016  . Left anterior cruciate ligament tear 06/24/2016  . Hemarthrosis of left knee 06/17/2016  . Possible exposure to STD 10/10/2015  . Smoker 10/10/2015  . Skin lesion of back 10/10/2015  . Encounter for preventative adult health care exam with abnormal findings 09/30/2013  . Helicobacter pylori ab+ 09/30/2013  . 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 Anxiety and depression   . Migraines    Past Medical History:  Diagnosis Date  . Depression   . Helicobacter pylori ab+ 09/30/2013   Treated approx 2008  . Migraines   . 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 UTI (lower urinary  tract infection)    Family History  Problem Relation Age of Onset  . Alcohol abuse Other   . Mental illness Other   . Sudden death Other   . Hypertension Other   . Diabetes Other    History reviewed. No pertinent surgical history. Social History   Occupational History  . Hair Stylist    Social History Main Topics  . Smoking status: Current Every Day Smoker  . Smokeless tobacco: Never Used  . Alcohol use Yes  . Drug use: No  . Sexual activity: Not on file    OBJECTIVE:  VS:  HT:5' 10.5" (179.1 cm)   WT:141 lb (64 kg)  BMI:20    BP:140/80  HR:100bpm  TEMP: ( )  RESP:98 % EXAM: Findings:  Left knee is overall well aligned.  She has a small effusion today.  She is stable to varus and valgus strain.  No significant pain with McMurray's.  Range of motion is from 0 degrees to 115 degrees.  Extensor mechanism intact.  Negative McMurray's.  No mechanical symptoms with with Thessaly.     No results found. ASSESSMENT & PLAN:     ICD-10-CM   1. Rupture of anterior cruciate ligament of left knee, subsequent encounter S83.512D   ================================================================= Left anterior cruciate ligament tear She reports overall fairly stable symptoms that are actually lessening in severity over time.  She is having only very intermittent episodes of mechanical type symptoms with no true  giving way or pivot shift mechanism in general.  However she did have a small misstep on some steps 2 days ago and has a small effusion today but she reports prior to this she was doing quite well.  She has been having some issues with the way that her brace fits and [employer] is evaluating this for her today.  Overall she is content with how she has continued to improve and notices that if she slacks off on her exercises her symptoms worsen.  We will plan to follow-up with her in 3 months and reevaluate how she is doing.  She does have an upcoming cross-country trip and I cautioned  her on being diligent to wear her brace.  She reports only minimal effusion type symptoms normally ================================================================= There are no Patient Instructions on file for this visit.================================================================= No future appointments.  Follow-up: Return in about 3 months (around 03/18/2017).   CMA/ATC served as Education administrator during this visit. History, Physical, and Plan performed by medical provider. Documentation and orders reviewed and attested to.      Teresa Coombs, [facility] Physician

## 2016-12-18 ENCOUNTER — Encounter: Payer: Self-pay | Admitting: Internal Medicine

## 2016-12-18 ENCOUNTER — Ambulatory Visit (INDEPENDENT_AMBULATORY_CARE_PROVIDER_SITE_OTHER): Payer: BLUE CROSS/BLUE SHIELD | Admitting: Internal Medicine

## 2016-12-18 VITALS — BP 112/78 | HR 87 | Ht 70.5 in | Wt 140.0 lb

## 2016-12-18 DIAGNOSIS — Z202 Contact with and (suspected) exposure to infections with a predominantly sexual mode of transmission: Secondary | ICD-10-CM | POA: Diagnosis not present

## 2016-12-18 DIAGNOSIS — F17200 Nicotine dependence, unspecified, uncomplicated: Secondary | ICD-10-CM | POA: Diagnosis not present

## 2016-12-18 DIAGNOSIS — F419 Anxiety disorder, unspecified: Secondary | ICD-10-CM

## 2016-12-18 DIAGNOSIS — F410 Panic disorder [episodic paroxysmal anxiety] without agoraphobia: Secondary | ICD-10-CM | POA: Diagnosis not present

## 2016-12-18 DIAGNOSIS — F32A Depression, unspecified: Secondary | ICD-10-CM

## 2016-12-18 DIAGNOSIS — F329 Major depressive disorder, single episode, unspecified: Secondary | ICD-10-CM

## 2016-12-18 DIAGNOSIS — B001 Herpesviral vesicular dermatitis: Secondary | ICD-10-CM

## 2016-12-18 DIAGNOSIS — Z0001 Encounter for general adult medical examination with abnormal findings: Secondary | ICD-10-CM | POA: Diagnosis not present

## 2016-12-18 NOTE — Assessment & Plan Note (Signed)
Fortunately only very occasional and also flying later this wk with travel anxiety - for xanax low dose limited rx, to f/u any worsening symptoms or concerns

## 2016-12-18 NOTE — Assessment & Plan Note (Signed)

## 2016-12-18 NOTE — Patient Instructions (Signed)
Please take all new medication as prescribed - the lexapro 10 mg per day, and xanax as needed  Please continue all other medications as before, and refills have been done if requested.  Please have the pharmacy call with any other refills you may need.  Please continue your efforts at being more active, low cholesterol diet, and weight control.  You are otherwise up to date with prevention measures today.  Please keep your appointments with your specialists as you may have planned  Please go to the LAB in the Basement (turn left off the elevator) for the tests to be done today  You will be contacted by phone if any changes need to be made immediately.  Otherwise, you will receive a letter about your results with an explanation, but please check with MyChart first.  Please remember to sign up for MyChart if you have not done so, as this will be important to you in the future with finding out test results, communicating by private email, and scheduling acute appointments online when needed.  Please return in 1 year for your yearly visit, or sooner if needed

## 2016-12-18 NOTE — Progress Notes (Signed)
Subjective:    Patient ID: Amanda Murray, female    DOB: [birth year], 41 y.o.   MRN: [id number]  HPI  Here for wellness and f/u;  Overall doing ok;  Pt denies Chest pain, worsening SOB, DOE, wheezing, orthopnea, PND, worsening LE edema, palpitations, dizziness or syncope.  Pt denies neurological change such as new headache, facial or extremity weakness.  Pt denies polydipsia, polyuria, or low sugar symptoms. Pt states overall good compliance with treatment and medications, good tolerability, and has been trying to follow appropriate diet.  No fever, night sweats, loss of appetite, or other constitutional symptoms.  Pt states good ability with ADL's, has low fall risk, home safety reviewed and adequate, no other significant changes in hearing or vision, and not currently active with exercise due to recent left ACL tear.  Still smoking `1 ppd, ETOH at 1/2 bottle about 3 times per wk, and no illicit drug use. Pt main complaint is persistent and mild worsening moderate depression symptoms and tearful mood swings anxiety and occasional panic.  Has lost wt about 8 lbs for unclear reasons. 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  Feels quite insecure and not sure if anything will be better in future  Was referred last yr for counseling but she deferred at the time, though she did try to reach out in may 2018 during a particularly stressful time.  She states did not get a call back and did not go.  Declines further counseling referral now.  Does need valtrex for recurring oral cold sores.  States not recently sexually active, but asks for STD labs in addition to routine labs. Denies pelvic pain, vaginal d/c or rash Past Medical History:  Diagnosis Date  . Depression   . Helicobacter pylori ab+ 09/30/2013   Treated approx 2008  . Migraines   . 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 UTI (lower urinary  tract infection)    No past surgical history on file.  reports that she has been smoking.  She has never used smokeless tobacco. She reports that she drinks alcohol. She reports that she does not use drugs. family history includes Alcohol abuse in her other; Diabetes in her other; Hypertension in her other; Mental illness in her other; Sudden death in her other. No Known Allergies Current Outpatient Prescriptions on File Prior to Visit  Medication Sig Dispense Refill  . JUNEL FE 1/20 1-20 MG-MCG tablet Take 1 tablet by mouth daily.  0  . Naproxen-Esomeprazole (VIMOVO) 500-20 MG TBEC Take 1 tablet by mouth 2 (two) times daily. 180 tablet 1  . valACYclovir (VALTREX) 1000 MG tablet Take 1 tablet (1,000 mg total) by mouth 2 (two) times daily. Yearly physical due in may must see MD for future refills 14 tablet 1   No current facility-administered medications on file prior to visit.    Review of Systems Constitutional: Negative for other unusual diaphoresis, sweats, appetite or weight changes HENT: Negative for other worsening hearing loss, ear pain, facial swelling, mouth sores or neck stiffness.   Eyes: Negative for other worsening pain, redness or other visual disturbance.  Respiratory: Negative for other stridor or swelling Cardiovascular: Negative for other palpitations or other chest pain  Gastrointestinal: Negative for worsening diarrhea or loose stools, blood in stool, distention or other pain Genitourinary: Negative for hematuria, flank pain or other change in urine volume.  Musculoskeletal: Negative for myalgias or other joint swelling.  Skin: Negative for other color change, or other wound or worsening drainage.  Neurological: Negative for other syncope or numbness. Hematological: Negative for other adenopathy or swelling Psychiatric/Behavioral: Negative for hallucinations, other worsening agitation, SI, self-injury, or new decreased concentration All other system neg per pt      Objective:   Physical Exam BP 112/78   Pulse 87   Ht 5' 10.5" (1.791 m)   Wt 140 lb (63.5 kg)   LMP 11/28/2016 (Approximate)   SpO2 99%   BMI 19.80 kg/m  VS noted,  Constitutional: Pt is oriented to person, place, and time. Appears well-developed and well-nourished, in no significant distress and comfortable Head: Normocephalic and atraumatic  Eyes: Conjunctivae and EOM are normal. Pupils are equal, round, and reactive to light Right Ear: External ear normal without discharge Left Ear: External ear normal without discharge Nose: Nose without discharge or deformity Mouth/Throat: Oropharynx is without other ulcerations and moist  Neck: Normal range of motion. Neck supple. No JVD present. No tracheal deviation present or significant neck LA or mass Cardiovascular: Normal rate, regular rhythm, normal heart sounds and intact distal pulses.   Pulmonary/Chest: WOB normal and breath sounds without rales or wheezing  Abdominal: Soft. Bowel sounds are normal. NT. No HSM  Musculoskeletal: Normal range of motion. Exhibits no edema except for left knee in soft neoprene brace Lymphadenopathy: Has no other cervical adenopathy.  Neurological: Pt is alert and oriented to person, place, and time. Pt has normal reflexes. No cranial nerve deficit. Motor grossly intact, Gait intact Skin: Skin is warm and dry. No rash noted or new ulcerations, no current cold sores Psychiatric:  Has nervous depressed mood and affect. Behavior is normal without agitation No other exam findings Lab Results  Component Value Date   WBC 4.0 10/10/2015   HGB 13.7 10/10/2015   HCT 40.0 10/10/2015   PLT 224.0 10/10/2015   GLUCOSE 76 10/10/2015   CHOL 181 10/10/2015   TRIG 97.0 10/10/2015   HDL 73.80 10/10/2015   LDLCALC 88 10/10/2015   ALT 17 10/10/2015   AST 24 10/10/2015   NA 141 10/10/2015   K 4.2 10/10/2015   CL 107 10/10/2015   CREATININE 0.58 10/10/2015   BUN 11 10/10/2015   CO2 29 10/10/2015   TSH 1.18  10/10/2015       Assessment & Plan:

## 2016-12-18 NOTE — Assessment & Plan Note (Signed)
Ok for std lab evaluation as requested, asymptomatic

## 2016-12-18 NOTE — Assessment & Plan Note (Signed)
Not ready to quit, has tried wellbutrin without success  - just made her more irritable; pt to call if would want to try chantix

## 2016-12-18 NOTE — Assessment & Plan Note (Addendum)
Ok for lexapro 10 qd, declines counseling referral for now  In addition to the time spent performing CPE, I spent an additional 25 minutes face to face,in which greater than 50% of this time was spent in counseling and coordination of care for patient's acute illness as documented, including the differential dx, further eval and tx and other management of anxiety, depression, panic, STD risks, tobacco and ETOH use, and recurrent cold sores.

## 2016-12-18 NOTE — Assessment & Plan Note (Signed)
None at this time, for valtrex prn

## 2016-12-19 ENCOUNTER — Other Ambulatory Visit (INDEPENDENT_AMBULATORY_CARE_PROVIDER_SITE_OTHER): Payer: BLUE CROSS/BLUE SHIELD

## 2016-12-19 DIAGNOSIS — Z0001 Encounter for general adult medical examination with abnormal findings: Secondary | ICD-10-CM | POA: Diagnosis not present

## 2016-12-19 LAB — URINALYSIS, ROUTINE W REFLEX MICROSCOPIC
Bilirubin Urine: NEGATIVE
Hgb urine dipstick: NEGATIVE
Ketones, ur: NEGATIVE
Leukocytes, UA: NEGATIVE
Nitrite: NEGATIVE
Specific Gravity, Urine: 1.025 (ref 1.000–1.030)
Total Protein, Urine: NEGATIVE
Urine Glucose: NEGATIVE
Urobilinogen, UA: 0.2 (ref 0.0–1.0)
pH: 6 (ref 5.0–8.0)

## 2016-12-19 LAB — LIPID PANEL
Cholesterol: 192 mg/dL (ref 0–200)
HDL: 73.7 mg/dL (ref >39.00)
LDL Cholesterol: 94 mg/dL (ref 0–99)
NonHDL: 118.6
Total CHOL/HDL Ratio: 3
Triglycerides: 125 mg/dL (ref 0.0–149.0)
VLDL: 25 mg/dL (ref 0.0–40.0)

## 2016-12-19 LAB — HEPATIC FUNCTION PANEL
ALT: 12 U/L (ref 0–35)
AST: 17 U/L (ref 0–37)
Albumin: 4.5 g/dL (ref 3.5–5.2)
Alkaline Phosphatase: 39 U/L (ref 39–117)
Bilirubin, Direct: 0 mg/dL (ref 0.0–0.3)
Total Bilirubin: 0.2 mg/dL (ref 0.2–1.2)
Total Protein: 7.1 g/dL (ref 6.0–8.3)

## 2016-12-19 LAB — CBC WITH DIFFERENTIAL/PLATELET
Basophils Absolute: 0.1 10*3/uL (ref 0.0–0.1)
Basophils Relative: 2 % (ref 0.0–3.0)
Eosinophils Absolute: 0.1 10*3/uL (ref 0.0–0.7)
Eosinophils Relative: 3 % (ref 0.0–5.0)
HCT: 39.5 % (ref 36.0–46.0)
Hemoglobin: 13 g/dL (ref 12.0–15.0)
Lymphocytes Relative: 25.5 % (ref 12.0–46.0)
Lymphs Abs: 1.3 10*3/uL (ref 0.7–4.0)
MCHC: 32.9 g/dL (ref 30.0–36.0)
MCV: 93.6 fl (ref 78.0–100.0)
Monocytes Absolute: 0.6 10*3/uL (ref 0.1–1.0)
Monocytes Relative: 12.9 % — ABNORMAL HIGH (ref 3.0–12.0)
Neutro Abs: 2.8 10*3/uL (ref 1.4–7.7)
Neutrophils Relative %: 56.6 % (ref 43.0–77.0)
Platelets: 218 10*3/uL (ref 150.0–400.0)
RBC: 4.22 Mil/uL (ref 3.87–5.11)
RDW: 14.7 % (ref 11.5–15.5)
WBC: 4.9 10*3/uL (ref 4.0–10.5)

## 2016-12-19 LAB — BASIC METABOLIC PANEL
BUN: 11 mg/dL (ref 6–23)
CO2: 30 mEq/L (ref 19–32)
Calcium: 9.3 mg/dL (ref 8.4–10.5)
Chloride: 105 mEq/L (ref 96–112)
Creatinine, Ser: 0.75 mg/dL (ref 0.40–1.20)
GFR: 90.69 mL/min (ref >60.00)
Glucose, Bld: 110 mg/dL — ABNORMAL HIGH (ref 70–99)
Potassium: 4.6 mEq/L (ref 3.5–5.1)
Sodium: 139 mEq/L (ref 135–145)

## 2016-12-19 LAB — TSH: TSH: 0.97 u[IU]/mL (ref 0.35–4.50)

## 2016-12-19 NOTE — Addendum Note (Signed)
Addended by: Juliet Rude on: 12/19/2016 02:39 PM   Modules accepted: Orders

## 2016-12-19 NOTE — Addendum Note (Signed)
Addended by: Juliet Rude on: 12/19/2016 02:47 PM   Modules accepted: Orders

## 2016-12-19 NOTE — Addendum Note (Signed)
Addended by: Juliet Rude on: 12/19/2016 02:50 PM   Modules accepted: Orders

## 2016-12-20 LAB — RPR

## 2016-12-20 LAB — HEPATITIS B CORE ANTIBODY, IGM: Hep B C IgM: NONREACTIVE

## 2016-12-20 LAB — HIV ANTIBODY (ROUTINE TESTING W REFLEX): HIV 1&2 Ab, 4th Generation: NONREACTIVE

## 2017-01-04 NOTE — Assessment & Plan Note (Signed)
She reports overall fairly stable symptoms that are actually lessening in severity over time.  She is having only very intermittent episodes of mechanical type symptoms with no true giving way or pivot shift mechanism in general.  However she did have a small misstep on some steps 2 days ago and has a small effusion today but she reports prior to this she was doing quite well.  She has been having some issues with the way that her brace fits and [employer] is evaluating this for her today.  Overall she is content with how she has continued to improve and notices that if she slacks off on her exercises her symptoms worsen.  We will plan to follow-up with her in 3 months and reevaluate how she is doing.  She does have an upcoming cross-country trip and I cautioned her on being diligent to wear her brace.  She reports only minimal effusion type symptoms normally

## 2017-03-29 DIAGNOSIS — M545 Low back pain: Secondary | ICD-10-CM | POA: Diagnosis not present

## 2017-03-29 DIAGNOSIS — Z3202 Encounter for pregnancy test, result negative: Secondary | ICD-10-CM | POA: Diagnosis not present

## 2017-04-28 ENCOUNTER — Encounter: Payer: Self-pay | Admitting: Internal Medicine

## 2017-04-29 MED ORDER — ESCITALOPRAM OXALATE 10 MG PO TABS: 10.0000 mg | ORAL_TABLET | Freq: Every day | ORAL | 3 refills | Status: DC

## 2017-04-29 MED ORDER — ALPRAZOLAM 0.25 MG PO TABS: 0.2500 mg | ORAL_TABLET | Freq: Two times a day (BID) | ORAL | 1 refills | Status: DC | PRN

## 2017-04-29 NOTE — Telephone Encounter (Signed)
I am confused as to the request for lexapro and xanax as I see where I did this at the last visit note, but no record of these meds appears with the EMR med list  I added lexapro to med list  Xanax  - Done erx

## 2017-05-12 DIAGNOSIS — T1490XA Injury, unspecified, initial encounter: Secondary | ICD-10-CM | POA: Diagnosis not present

## 2017-05-22 ENCOUNTER — Ambulatory Visit: Payer: Self-pay | Admitting: Internal Medicine

## 2017-05-31 ENCOUNTER — Emergency Department (HOSPITAL_COMMUNITY)
Admission: EM | Admit: 2017-05-31 | Discharge: 2017-06-01 | Disposition: A | Discharge status: Other Acute Inpatient Hospital | Payer: BLUE CROSS/BLUE SHIELD | Attending: Emergency Medicine | Admitting: Emergency Medicine

## 2017-05-31 ENCOUNTER — Encounter (HOSPITAL_COMMUNITY): Payer: Self-pay

## 2017-05-31 ENCOUNTER — Other Ambulatory Visit: Payer: Self-pay

## 2017-05-31 DIAGNOSIS — F29 Unspecified psychosis not due to a substance or known physiological condition: Secondary | ICD-10-CM | POA: Diagnosis not present

## 2017-05-31 DIAGNOSIS — T1491XA Suicide attempt, initial encounter: Secondary | ICD-10-CM | POA: Diagnosis not present

## 2017-05-31 DIAGNOSIS — Z79899 Other long term (current) drug therapy: Secondary | ICD-10-CM | POA: Insufficient documentation

## 2017-05-31 DIAGNOSIS — F322 Major depressive disorder, single episode, severe without psychotic features: Secondary | ICD-10-CM | POA: Diagnosis not present

## 2017-05-31 DIAGNOSIS — F332 Major depressive disorder, recurrent severe without psychotic features: Secondary | ICD-10-CM

## 2017-05-31 DIAGNOSIS — F17200 Nicotine dependence, unspecified, uncomplicated: Secondary | ICD-10-CM | POA: Insufficient documentation

## 2017-05-31 DIAGNOSIS — R45851 Suicidal ideations: Secondary | ICD-10-CM | POA: Diagnosis not present

## 2017-05-31 DIAGNOSIS — T424X2A Poisoning by benzodiazepines, intentional self-harm, initial encounter: Secondary | ICD-10-CM | POA: Diagnosis not present

## 2017-05-31 DIAGNOSIS — T50902A Poisoning by unspecified drugs, medicaments and biological substances, intentional self-harm, initial encounter: Secondary | ICD-10-CM

## 2017-05-31 LAB — CBC
HCT: 43.3 % (ref 36.0–46.0)
Hemoglobin: 14.8 g/dL (ref 12.0–15.0)
MCH: 31.9 pg (ref 26.0–34.0)
MCHC: 34.2 g/dL (ref 30.0–36.0)
MCV: 93.3 fL (ref 78.0–100.0)
Platelets: 258 10*3/uL (ref 150–400)
RBC: 4.64 MIL/uL (ref 3.87–5.11)
RDW: 13.8 % (ref 11.5–15.5)
WBC: 7.3 10*3/uL (ref 4.0–10.5)

## 2017-05-31 LAB — COMPREHENSIVE METABOLIC PANEL
ALT: 17 U/L (ref 14–54)
AST: 28 U/L (ref 15–41)
Albumin: 4.3 g/dL (ref 3.5–5.0)
Alkaline Phosphatase: 47 U/L (ref 38–126)
Anion gap: 10 (ref 5–15)
BUN: 14 mg/dL (ref 6–20)
CO2: 20 mmol/L — ABNORMAL LOW (ref 22–32)
Calcium: 8.6 mg/dL — ABNORMAL LOW (ref 8.9–10.3)
Chloride: 108 mmol/L (ref 101–111)
Creatinine, Ser: 0.61 mg/dL (ref 0.44–1.00)
GFR calc Af Amer: >60 mL/min (ref >60)
GFR calc non Af Amer: >60 mL/min (ref >60)
Glucose, Bld: 64 mg/dL — ABNORMAL LOW (ref 65–99)
Potassium: 4.4 mmol/L (ref 3.5–5.1)
Sodium: 138 mmol/L (ref 135–145)
Total Bilirubin: 0.3 mg/dL (ref 0.3–1.2)
Total Protein: 7.7 g/dL (ref 6.5–8.1)

## 2017-05-31 LAB — I-STAT BETA HCG BLOOD, ED (MC, WL, AP ONLY): I-stat hCG, quantitative: <5 m[IU]/mL (ref <5)

## 2017-05-31 LAB — CBG MONITORING, ED: Glucose-Capillary: 75 mg/dL (ref 65–99)

## 2017-05-31 NOTE — ED Triage Notes (Addendum)
Pt BIB EMS from home, third party called to report pt have made SI threats and attempts of suicidal. Pt stated she took about 30 .25 mg xanax around 7pm this evening. Hx of anxiety, depression. Cooperative for EMS. Pt states "I'm at my whits end" Pt has no support system and feels an impending doom every day. Pt states she took 4 xanax last night to "try and go to sleep" and was disappointed that she woke up today.

## 2017-05-31 NOTE — ED Notes (Signed)
Pt attempted to leave the ED. MD notified and pt was IVC. Pt pulled out IV in the process of trying to leave. Pt now in gown and placed on monitor.

## 2017-05-31 NOTE — ED Notes (Signed)
Poison control called, spoke to [facility]

## 2017-05-31 NOTE — ED Provider Notes (Signed)
Medical screening examination/treatment/procedure(s) were conducted as a shared visit with non-physician practitioner(s) and myself.  I personally evaluated the patient during the encounter.   EKG Interpretation None     Patient made suicidal threats over the phone to her significant other.  She reported taking about 30.25 mg Xanax tablets.  Patient is alert and nontoxic.  She is ambulatory without difficulty.  Respirations are calm.  Mental status is clear.  Patient did attempt to elope from the emergency department by walking out.  I did advise the patient that she would have to stay in the emergency department under IVC conditions until it was clear that she had had a full psychiatric evaluation for suicidal threats and attempt.  Patient is now advising that she was trying to get her boyfriend's attention but is denying being actively suicidal.  Patient will require IVC until cleared by psychiatry.  Patient is medically stable.   Charlesetta Shanks, MD 06/05/17 1359

## 2017-05-31 NOTE — ED Notes (Signed)
Bed: HD89 Expected date:  Expected time:  Means of arrival:  Comments: 42 yo F/SI and OD

## 2017-05-31 NOTE — ED Provider Notes (Signed)
[place] DEPT Provider Note   CSN: [id number] Arrival date & time: 05/31/17  2110     History   Chief Complaint Chief Complaint  Patient presents with  . Drug Overdose    HPI Amanda Murray is a 42 y.o. female with history of depression, migraine, and recurrent cold sores presents today for evaluation of overdose on Xanax.  She states that she is been feeling progressively worsening feelings of depression and suicidal ideation for the past year.  She states earlier today at around 3 PM she took 4 tablets of 0.25 mg Xanax and went to sleep.  She states she was disappointed when she awoke.  She then took a handful of Xanax, approximately 30 tablets of the 0.25 mg dosage at around 8 PM.  When asked if this was a suicide attempt she said "ideally I would like to die in my sleep, so yes ". She states she feels as though there is no point to living and expresses feelings of ongoing and worsening loneliness.  She does endorse feelings of homicidal ideation additionally, stating that she feels like strangling people at times out of frustration.  She denies any medical complaints at this time.  No shortness of breath.  She states "I am feeling pretty relaxed right now ".  States that she drank wine last night but none today.  Denies any other alcohol or drug use. RN Claiborne Billings spoke with poison control who recommended obtaining EKG to assess for QT prolongation.  They also recommend obtaining salicylate and Tylenol levels.  They recommend observation for 4-6 hours for stability.  The history is provided by the patient.    Past Medical History:  Diagnosis Date  . Depression   . Helicobacter pylori ab+ 09/30/2013   Treated approx 2008  . Migraines   . 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 UTI (lower urinary tract infection)     Patient Active Problem List   Diagnosis Date Noted  . Panic attacks 12/18/2016  . Left anterior cruciate ligament tear 06/24/2016  . Hemarthrosis  of left knee 06/17/2016  . Possible exposure to STD 10/10/2015  . Smoker 10/10/2015  . Skin lesion of back 10/10/2015  . Encounter for preventative adult health care exam with abnormal findings 09/30/2013  . Helicobacter pylori ab+ 09/30/2013  . 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 Anxiety and depression   . Migraines     History reviewed. No pertinent surgical history.  OB History    No data available       Home Medications    Prior to Admission medications   Medication Sig Start Date End Date Taking? Authorizing Provider  ALPRAZolam (XANAX) 0.25 MG tablet Take 1 tablet (0.25 mg total) by mouth 2 (two) times daily as needed for anxiety. 04/29/17  Yes Biagio Borg, MD  escitalopram (LEXAPRO) 10 MG tablet Take 1 tablet (10 mg total) by mouth daily. 04/29/17  Yes Biagio Borg, MD  valACYclovir (VALTREX) 1000 MG tablet Take 1 tablet (1,000 mg total) by mouth 2 (two) times daily. Yearly physical due in may must see MD for future refills Patient taking differently: Take 1,000 mg by mouth 2 (two) times daily as needed (outbreak). Yearly physical due in may must see MD for future refills 09/11/16  Yes Biagio Borg, MD  Naproxen-Esomeprazole (VIMOVO) 500-20 MG TBEC Take 1 tablet by mouth 2 (two) times daily. Patient not taking: Reported on 05/31/2017 12/16/16   Gerda Diss, DO  Family History Family History  Problem Relation Age of Onset  . Alcohol abuse Other   . Mental illness Other   . Sudden death Other   . Hypertension Other   . Diabetes Other     Social History Social History   Tobacco Use  . Smoking status: Current Every Day Smoker  . Smokeless tobacco: Never Used  Substance Use Topics  . Alcohol use: Yes  . Drug use: No     Allergies   Patient has no known allergies.   Review of Systems Review of Systems  Constitutional: Negative for chills and fever.  Respiratory: Negative for shortness of breath.   Cardiovascular: Negative for chest pain.    Gastrointestinal: Negative for abdominal pain.  Psychiatric/Behavioral: Positive for dysphoric mood, self-injury and suicidal ideas. Negative for hallucinations.  All other systems reviewed and are negative.    Physical Exam Updated Vital Signs BP 114/75   Pulse 71   Temp 98.3 F (36.8 C) (Oral)   Resp 20   Ht 5\' 10"  (1.778 m)   Wt 63.5 kg (140 lb)   LMP 04/13/2017   SpO2 96%   BMI 20.09 kg/m   Physical Exam  Constitutional: She is oriented to person, place, and time. She appears well-developed and well-nourished. No distress.  HENT:  Head: Normocephalic and atraumatic.  Eyes: Conjunctivae and EOM are normal. Pupils are equal, round, and reactive to light. Right eye exhibits no discharge. Left eye exhibits no discharge.  Pupils somewhat dilated, but equally so  Neck: Normal range of motion. Neck supple. No JVD present. No tracheal deviation present.  Cardiovascular: Regular rhythm, normal heart sounds and intact distal pulses.  Tachycardic, 2+ radial and DP/PT pulses bl  Pulmonary/Chest: Effort normal and breath sounds normal. No stridor. She has no wheezes. She has no rales. She exhibits no tenderness.  Abdominal: Soft. Bowel sounds are normal. She exhibits no distension. There is no tenderness.  Musculoskeletal: She exhibits no edema.  Neurological: She is alert and oriented to person, place, and time. No cranial nerve deficit or sensory deficit. She exhibits normal muscle tone.  Skin: Skin is warm and dry. No erythema.  Psychiatric: Her speech is normal. She is withdrawn. She is not actively hallucinating. She expresses impulsivity. She exhibits a depressed mood. She expresses homicidal and suicidal ideation. She expresses suicidal plans and homicidal plans.  Occasionally tearful, does not appear to be responding to internal stimuli.   Nursing note and vitals reviewed.    ED Treatments / Results  Labs (all labs ordered are listed, but only abnormal results are  displayed) Labs Reviewed  COMPREHENSIVE METABOLIC PANEL - Abnormal; Notable for the following components:      Result Value   CO2 20 (*)    Glucose, Bld 64 (*)    Calcium 8.6 (*)    All other components within normal limits  CBG MONITORING, ED - Abnormal; Notable for the following components:   Glucose-Capillary 119 (*)    All other components within normal limits  CBC  ETHANOL  SALICYLATE LEVEL  ACETAMINOPHEN LEVEL  RAPID URINE DRUG SCREEN, HOSP PERFORMED  CBG MONITORING, ED  I-STAT BETA HCG BLOOD, ED ([facility], [facility], [facility] ONLY)    EKG  EKG Interpretation  Date/Time:  Saturday May 31 2017 21:19:53 EST Ventricular Rate:  108 PR Interval:    QRS Duration: 96 QT Interval:  338 QTC Calculation: 453 R Axis:   89 Text Interpretation:  Sinus tachycardia Probable left atrial enlargement agree. otherwise normal.  no old comparison Confirmed by Charlesetta Shanks [phone]) on 06/01/2017 12:52:50 AM       Radiology No results found.  Procedures Procedures (including critical care time)  Medications Ordered in ED Medications  ondansetron (ZOFRAN) injection 4 mg (4 mg Intravenous Given 06/01/17 0045)  dextrose 50 % solution 25 mL (25 mLs Intravenous Given 06/01/17 0045)     Initial Impression / Assessment and Plan / ED Course  I have reviewed the triage vital signs and the nursing notes.  Pertinent labs & imaging results that were available during my care of the patient were reviewed by me and considered in my medical decision making (see chart for details).     Patient presents to the ED after intentional overdose of Xanax.  Afebrile, initially tachycardic with improvement while in the ED.  Nontoxic in appearance.  She is alert, oriented, and following commands without difficulty.  No focal neurologic deficits on examination.  Poison control recommended observation for 6 hours, obtain EKG to evaluate for QT prolongation, and obtaining acetaminophen and salicylate level. No QT  prolongation on EKG.  Patient somewhat hypoglycemic in the ED with improvement after administration of half amp of D50.  She did have one episode of emesis prior to this and was given Zofran.  CBG improved to 119 on reevaluation.  She did require IVC and has exhibited that she is a clear danger to herself at this time.  1:31 AM Signed out to [place]. Awaiting acetaminophen, salicylate, and ethanol levels.  Patient otherwise medically cleared for TTS evaluation.  Resting comfortably in no apparent distress.  Tolerating p.o. food and fluids without difficulty.  She is currently under IVC for suicidal ideation and suicide attempt. Awaiting TTS evaluation.   Final Clinical Impressions(s) / ED Diagnoses   Final diagnoses:  Intentional drug overdose, initial encounter [facility])    ED Discharge Orders    None       Renita Papa, PA-C 06/01/17 [facility]    Charlesetta Shanks, MD 06/05/17 1356

## 2017-06-01 ENCOUNTER — Inpatient Hospital Stay (HOSPITAL_COMMUNITY)
Admission: AD | Admit: 2017-06-01 | Discharge: 2017-06-02 | DRG: 885 | Disposition: A | Discharge status: Home / Self Care | Payer: BLUE CROSS/BLUE SHIELD | Source: Intra-hospital | Attending: Psychiatry | Admitting: Psychiatry

## 2017-06-01 ENCOUNTER — Encounter (HOSPITAL_COMMUNITY): Payer: Self-pay | Admitting: Psychiatry

## 2017-06-01 ENCOUNTER — Other Ambulatory Visit: Payer: Self-pay

## 2017-06-01 DIAGNOSIS — Z915 Personal history of self-harm: Secondary | ICD-10-CM

## 2017-06-01 DIAGNOSIS — Z599 Problem related to housing and economic circumstances, unspecified: Secondary | ICD-10-CM

## 2017-06-01 DIAGNOSIS — F332 Major depressive disorder, recurrent severe without psychotic features: Secondary | ICD-10-CM | POA: Diagnosis not present

## 2017-06-01 DIAGNOSIS — T424X2A Poisoning by benzodiazepines, intentional self-harm, initial encounter: Secondary | ICD-10-CM | POA: Diagnosis not present

## 2017-06-01 DIAGNOSIS — Z811 Family history of alcohol abuse and dependence: Secondary | ICD-10-CM

## 2017-06-01 DIAGNOSIS — Z818 Family history of other mental and behavioral disorders: Secondary | ICD-10-CM | POA: Diagnosis not present

## 2017-06-01 DIAGNOSIS — F4310 Post-traumatic stress disorder, unspecified: Secondary | ICD-10-CM | POA: Diagnosis not present

## 2017-06-01 DIAGNOSIS — R7989 Other specified abnormal findings of blood chemistry: Secondary | ICD-10-CM | POA: Diagnosis present

## 2017-06-01 DIAGNOSIS — T1491XA Suicide attempt, initial encounter: Secondary | ICD-10-CM | POA: Diagnosis not present

## 2017-06-01 DIAGNOSIS — Z79899 Other long term (current) drug therapy: Secondary | ICD-10-CM | POA: Diagnosis not present

## 2017-06-01 DIAGNOSIS — Z630 Problems in relationship with spouse or partner: Secondary | ICD-10-CM

## 2017-06-01 DIAGNOSIS — B001 Herpesviral vesicular dermatitis: Secondary | ICD-10-CM | POA: Diagnosis not present

## 2017-06-01 DIAGNOSIS — F17200 Nicotine dependence, unspecified, uncomplicated: Secondary | ICD-10-CM | POA: Diagnosis not present

## 2017-06-01 DIAGNOSIS — Z8249 Family history of ischemic heart disease and other diseases of the circulatory system: Secondary | ICD-10-CM

## 2017-06-01 DIAGNOSIS — R45851 Suicidal ideations: Secondary | ICD-10-CM | POA: Diagnosis not present

## 2017-06-01 DIAGNOSIS — Z833 Family history of diabetes mellitus: Secondary | ICD-10-CM

## 2017-06-01 DIAGNOSIS — Z634 Disappearance and death of family member: Secondary | ICD-10-CM

## 2017-06-01 DIAGNOSIS — F17210 Nicotine dependence, cigarettes, uncomplicated: Secondary | ICD-10-CM | POA: Diagnosis not present

## 2017-06-01 DIAGNOSIS — F410 Panic disorder [episodic paroxysmal anxiety] without agoraphobia: Secondary | ICD-10-CM | POA: Diagnosis not present

## 2017-06-01 DIAGNOSIS — F322 Major depressive disorder, single episode, severe without psychotic features: Secondary | ICD-10-CM | POA: Diagnosis not present

## 2017-06-01 DIAGNOSIS — F1290 Cannabis use, unspecified, uncomplicated: Secondary | ICD-10-CM | POA: Diagnosis not present

## 2017-06-01 HISTORY — DX: Anxiety disorder, unspecified: F41.9

## 2017-06-01 LAB — ETHANOL: Alcohol, Ethyl (B): 58 mg/dL — ABNORMAL HIGH (ref <10)

## 2017-06-01 LAB — SALICYLATE LEVEL: Salicylate Lvl: <7 mg/dL (ref 2.8–30.0)

## 2017-06-01 LAB — RAPID URINE DRUG SCREEN, HOSP PERFORMED
Amphetamines: NOT DETECTED
Barbiturates: NOT DETECTED
Benzodiazepines: POSITIVE — AB
Cocaine: NOT DETECTED
Opiates: NOT DETECTED
Tetrahydrocannabinol: POSITIVE — AB

## 2017-06-01 LAB — CBG MONITORING, ED: Glucose-Capillary: 119 mg/dL — ABNORMAL HIGH (ref 65–99)

## 2017-06-01 LAB — ACETAMINOPHEN LEVEL: Acetaminophen (Tylenol), Serum: <10 ug/mL — ABNORMAL LOW (ref 10–30)

## 2017-06-01 MED ORDER — ESCITALOPRAM OXALATE 20 MG PO TABS
20.0000 mg | ORAL_TABLET | Freq: Every day | ORAL | Status: DC
  Administered 2017-06-01 – 2017-06-02 (×2): 20 mg via ORAL
  Filled 2017-06-01 (×4): qty 1

## 2017-06-01 MED ORDER — DEXTROSE 50 % IV SOLN
0.5000 | Freq: Once | INTRAVENOUS | Status: AC
  Administered 2017-06-01: 25 mL via INTRAVENOUS
  Filled 2017-06-01: qty 50

## 2017-06-01 MED ORDER — ALUM & MAG HYDROXIDE-SIMETH 200-200-20 MG/5ML PO SUSP: 30.0000 mL | ORAL | Status: DC | PRN

## 2017-06-01 MED ORDER — CHLORDIAZEPOXIDE HCL 25 MG PO CAPS: 25.0000 mg | ORAL_CAPSULE | Freq: Three times a day (TID) | ORAL | Status: DC | PRN

## 2017-06-01 MED ORDER — NICOTINE 21 MG/24HR TD PT24
21.0000 mg | MEDICATED_PATCH | Freq: Every day | TRANSDERMAL | Status: DC
Start: 2017-06-01 — End: 2017-06-02
  Filled 2017-06-01 (×4): qty 1

## 2017-06-01 MED ORDER — ENSURE ENLIVE PO LIQD: 237.0000 mL | Freq: Two times a day (BID) | ORAL | Status: DC

## 2017-06-01 MED ORDER — ONDANSETRON HCL 4 MG/2ML IJ SOLN
4.0000 mg | Freq: Once | INTRAMUSCULAR | Status: AC
  Administered 2017-06-01: 4 mg via INTRAVENOUS
  Filled 2017-06-01: qty 2

## 2017-06-01 MED ORDER — ACETAMINOPHEN 325 MG PO TABS: 650.0000 mg | ORAL_TABLET | Freq: Four times a day (QID) | ORAL | Status: DC | PRN

## 2017-06-01 MED ORDER — HYDROXYZINE HCL 25 MG PO TABS
25.0000 mg | ORAL_TABLET | Freq: Four times a day (QID) | ORAL | Status: DC | PRN
  Administered 2017-06-01: 25 mg via ORAL
  Filled 2017-06-01: qty 1

## 2017-06-01 MED ORDER — MAGNESIUM HYDROXIDE 400 MG/5ML PO SUSP: 30.0000 mL | Freq: Every day | ORAL | Status: DC | PRN

## 2017-06-01 MED ORDER — TRAZODONE HCL 50 MG PO TABS
50.0000 mg | ORAL_TABLET | Freq: Every day | ORAL | Status: DC
  Administered 2017-06-01: 50 mg via ORAL
  Filled 2017-06-01 (×4): qty 1

## 2017-06-01 MED ORDER — NICOTINE 21 MG/24HR TD PT24: 21.0000 mg | MEDICATED_PATCH | Freq: Every day | TRANSDERMAL | Status: DC

## 2017-06-01 NOTE — Progress Notes (Signed)
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

## 2017-06-01 NOTE — ED Notes (Signed)
Hourly rounding reveals patient sleeping in room. No complaints, stable, in no acute distress. Q15 minute rounds and monitoring via Security Cameras to continue. 
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
## 2017-06-01 NOTE — Tx Team (Signed)
Initial Treatment Plan 06/01/2017 3:07 PM Tenna Lacko KZS:[id number]    PATIENT STRESSORS: Educational concerns Financial difficulties Marital or family conflict Occupational concerns Substance abuse   PATIENT STRENGTHS: Ability for insight Active sense of humor Average or above average intelligence Capable of independent living Communication skills General fund of knowledge Motivation for treatment/growth Supportive family/friends Work skills   PATIENT IDENTIFIED PROBLEMS: "anxiety"  "depression"  "coping skills"  "substance abuse"  "suicidal thoughts"             DISCHARGE CRITERIA:  Ability to meet basic life and health needs Adequate post-discharge living arrangements Improved stabilization in mood, thinking, and/or behavior Medical problems require only outpatient monitoring Motivation to continue treatment in a less acute level of care Need for constant or close observation no longer present Reduction of life-threatening or endangering symptoms to within safe limits Safe-care adequate arrangements made Verbal commitment to aftercare and medication compliance Withdrawal symptoms are absent or subacute and managed without 24-hour nursing intervention  PRELIMINARY DISCHARGE PLAN: Attend aftercare/continuing care group Attend PHP/IOP Attend 12-step recovery group Outpatient therapy Return to previous living arrangement Return to previous work or school arrangements  PATIENT/FAMILY INVOLVEMENT: This treatment plan has been presented to and reviewed with the patient, Amanda Murray..  The patient and family have been given the opportunity to ask questions and make suggestions.  Grayland Ormond [place], RN 06/01/2017, 3:07 PM
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
## 2017-06-01 NOTE — ED Provider Notes (Signed)
Care assumed from [facility], [place].  Please see her full H&P.  In short,  Amanda Murray is a 42 y.o. female presents for benzodiazepine overdose.  This was intentional.  She has cleared her 6-hour observation period.  No QT prolongation.   Physical Exam  BP 104/64   Pulse 86   Temp 98.3 F (36.8 C) (Oral)   Resp 18   Ht 5\' 10"  (1.778 m)   Wt 63.5 kg (140 lb)   LMP 04/13/2017   SpO2 95%   BMI 20.09 kg/m   Physical Exam  Constitutional: She appears well-developed and well-nourished. No distress.  HENT:  Head: Normocephalic.  Eyes: Conjunctivae are normal. No scleral icterus.  Neck: Normal range of motion.  Cardiovascular: Normal rate and intact distal pulses.  Pulmonary/Chest: Effort normal and breath sounds normal.  Musculoskeletal: Normal range of motion.  Neurological: She is alert.  Skin: Skin is warm and dry.  Nursing note and vitals reviewed.   ED Course/Procedures   Clinical Course as of Jun 01 346  Sun Jun 01, 2017  0200 Acetaminophen level and salicylate level negative.  Ethanol level 58.  Patient is medically cleared.  [HM]  [id number] Informed that patient has a bed at behavioral health.  [HM]    Clinical Course User Index [HM] Lulubelle Simcoe, Jarrett Soho, PA-C    Procedures  MDM   Presents with intentional overdose.  She has been evaluated by TTS and recommended for inpatient treatment.  She has been observed for more than 8 hours without rest or depression, bradycardia, hypotension or prolonged QT.  She is medically cleared for transfer to [facility].  Intentional drug overdose, initial encounter [facility])         Mael Delap, Jarrett Soho, [place] 06/01/17 [phone]

## 2017-06-01 NOTE — ED Notes (Signed)
Pt. Transferred to Acute Unit from ED to room after screening for contraband. Report to include Situation, Background, Assessment and Recommendations from RN. Pt. Oriented to unit including Q15 minute rounds as well as the security cameras for their protection. Patient is alert and oriented, warm and dry in no acute distress. Patient denies SI, HI, and AVH. Pt. Encouraged to let me know if needs arise.

## 2017-06-01 NOTE — ED Notes (Signed)
Pt discharged safely with [employer].  Pt was calm and cooperative.  All belongings sent with patient.

## 2017-06-01 NOTE — BHH Counselor (Signed)
Lindon Romp, NP has recommended the pt be referred to a psychiatric inpatient unit.  She is to be admitted to [facility] room 403-1.  Colletta [place], RN was made aware of the recommendations.

## 2017-06-01 NOTE — Plan of Care (Signed)
Nurse discussed depression, anxiety, coping skills with patient.  

## 2017-06-01 NOTE — Progress Notes (Signed)
  DATA ACTION RESPONSE  Objective- Pt. is visible in the dayroom, seen putting together a puzzle. Presents with a depressed affect and mood. Pt appears to be minimizing. C/o of insomnia this evening. New orders obtained. See MAR.   Subjective- Denies having any SI/HI/AVH/Pain at this time. Is cooperative and remains safe on the unit.  1:1 interaction in private to establish rapport. Encouragement, education, & support given from staff.  PRN vistaril and trazodone requested and will re-eval accordingly.   Safety maintained with Q 15 checks. Continue with POC.

## 2017-06-02 DIAGNOSIS — F17210 Nicotine dependence, cigarettes, uncomplicated: Secondary | ICD-10-CM

## 2017-06-02 MED ORDER — TRAZODONE HCL 50 MG PO TABS: 50.0000 mg | ORAL_TABLET | Freq: Every evening | ORAL | 0 refills | Status: DC | PRN

## 2017-06-02 MED ORDER — ENSURE ENLIVE PO LIQD: 237.0000 mL | Freq: Every day | ORAL | Status: DC | PRN

## 2017-06-02 MED ORDER — HYDROXYZINE HCL 25 MG PO TABS: 25.0000 mg | ORAL_TABLET | Freq: Four times a day (QID) | ORAL | 0 refills | Status: DC | PRN

## 2017-06-02 MED ORDER — ESCITALOPRAM OXALATE 20 MG PO TABS: 20.0000 mg | ORAL_TABLET | Freq: Every day | ORAL | 0 refills | Status: DC

## 2017-06-02 NOTE — Progress Notes (Signed)
NUTRITION ASSESSMENT  Pt identified as at risk on the Malnutrition Screen Tool  INTERVENTION: 1. Educated patient on the importance of nutrition and encouraged intake of food and beverages. 2. Discussed weight goals. 3. Supplements: will change Ensure Enlive order to once/day PRN, this supplement provides 350 kcal and 20 grams of protein.   NUTRITION DIAGNOSIS: Inadequate PO intakes PTA related to decreased appetite as evidenced by pt report.   Goal: Pt to meet >/= 90% of their estimated nutrition needs.  Monitor:  PO intake  Assessment:  Pt admitted for SI and has a hx of depression. Pt reports that recently, d/t stressors in her life, she has had a decreased appetite which has led to weight loss. Per chart review, weight has been stable since August. She has lost 6 lbs (4% body weight) compared to weight in April. This is not significant for time frame. Will change supplement order as outlined above. Continue to encourage PO intakes of meals and snacks.    42 y.o. female  Height: Ht Readings from Last 1 Encounters:  06/01/17 5\' 11"  (1.803 m)    Weight: Wt Readings from Last 1 Encounters:  06/01/17 142 lb 8 oz (64.6 kg)    Weight Hx: Wt Readings from Last 10 Encounters:  06/01/17 142 lb 8 oz (64.6 kg)  05/31/17 140 lb (63.5 kg)  12/18/16 140 lb (63.5 kg)  12/16/16 141 lb (64 kg)  08/27/16 148 lb 11.2 oz (67.4 kg)  07/15/16 145 lb 6.4 oz (66 kg)  06/24/16 143 lb 3.2 oz (65 kg)  06/17/16 144 lb 3.2 oz (65.4 kg)  10/10/15 144 lb (65.3 kg)  03/21/14 147 lb 8 oz (66.9 kg)    BMI:  Body mass index is 19.87 kg/m. Pt meets criteria for normal weight based on current BMI.  Estimated Nutritional Needs: Kcal: 25-30 kcal/kg Protein: > 1 gram protein/kg Fluid: 1 ml/kcal  Diet Order: Diet regular Room service appropriate? Yes; Fluid consistency: Thin Pt is also offered choice of unit snacks mid-morning and mid-afternoon.  Pt is eating as desired.   Lab results and  medications reviewed.     Jarome Matin, MS, RD, LDN, [facility] Inpatient Clinical Dietitian Pager # [phone] After hours/weekend pager # [phone]

## 2017-06-02 NOTE — Progress Notes (Signed)
  [facility] Adult Case Management Discharge Plan :  Will you be returning to the same living situation after discharge:  Yes,  with girlfriend At discharge, do you have transportation home?: Yes,  pt wants to use Uber or No. Do you have the ability to pay for your medications: Yes, BCBS  Release of information consent forms completed and in the chart;  Patient's signature needed at discharge.  Patient to Follow up at: [facility]. Go on 06/04/2017.   Specialty:  Behavioral Health Why:  Please attend your intake appointment on Wednesday, 06/04/17, at 1:00pm.  Please ask about medication management while at this appointment. Contact information: [place] [phone]           Next level of care provider has access to [place] and Suicide Prevention discussed: Yes,  with sister  Have you used any form of tobacco in the last 30 days? (Cigarettes, Smokeless Tobacco, Cigars, and/or Pipes): Yes  Has patient been referred to the Quitline?: Yes, faxed on 06/02/17  Patient has been referred for addiction treatment: Yes  Joanne Chars, [place] 06/02/2017, 11:07 AM
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
## 2017-06-02 NOTE — Progress Notes (Signed)
D:  Patient's self inventory sheet, patient sleeps good, sleep medication eventually helpful.  Good appetite, normal energy level, good concentration.  Rated depression 2, denied hopeless, anxiety 3.  Denied withdrawals.  Denied SI.  Denied physical problems.  Denied physical pain.  Goal is stay positive, have faith.  Hoping and praying to work tomorrow and remainder of week.  Plans to participate in groups and be inclusive with others, not isolate.  Outpatient therapy and be able to work.  Job is my positive thing in life.  Must keep job.  Has debt worries.  Job brings great joy and satisfaction. A:  Medications administered per MD orders.  Emotional support and encouragement given patient. R:  Denied SI and HI, contracts for safety.  Denied A/V hallucinations.  Safety maintained with 15 minute checks.

## 2017-06-02 NOTE — Tx Team (Signed)
Interdisciplinary Treatment and Diagnostic Plan Update  06/02/2017 Time of Session: 0952 Amanda Murray MRN: [id number]  Principal Diagnosis: Major depressive disorder, recurrent severe without psychotic features [facility])  Secondary Diagnoses: Principal Problem:   Major depressive disorder, recurrent severe without psychotic features ([place]) Active Problems:   Severe recurrent major depression without psychotic features ([place])   Current Medications:  Current Facility-Administered Medications  Medication Dose Route Frequency Provider Last Rate Last Dose  . acetaminophen (TYLENOL) tablet 650 mg  650 mg Oral Q6H PRN Rozetta Nunnery, NP      . alum & mag hydroxide-simeth (MAALOX/MYLANTA) 200-200-20 MG/5ML suspension 30 mL  30 mL Oral Q4H PRN Lindon Romp A, NP      . chlordiazePOXIDE (LIBRIUM) capsule 25 mg  25 mg Oral TID PRN Izediuno, Laruth Bouchard, MD      . escitalopram (LEXAPRO) tablet 20 mg  20 mg Oral Daily Izediuno, Laruth Bouchard, MD   20 mg at 06/02/17 0741  . feeding supplement (ENSURE ENLIVE) (ENSURE ENLIVE) liquid 237 mL  237 mL Oral Daily PRN Cobos, Fernando A, MD      . hydrOXYzine (ATARAX/VISTARIL) tablet 25 mg  25 mg Oral Q6H PRN Lindon Romp A, NP   25 mg at 06/01/17 2049  . magnesium hydroxide (MILK OF MAGNESIA) suspension 30 mL  30 mL Oral Daily PRN Lindon Romp A, NP      . nicotine (NICODERM CQ - dosed in mg/24 hours) patch 21 mg  21 mg Transdermal Daily Patrecia Pour, NP      . traZODone (DESYREL) tablet 50 mg  50 mg Oral QHS Lindon Romp A, NP   50 mg at 06/01/17 2049   PTA Medications: Medications Prior to Admission  Medication Sig Dispense Refill Last Dose  . escitalopram (LEXAPRO) 10 MG tablet Take 1 tablet (10 mg total) by mouth daily. 90 tablet 3 05/31/2017 at Unknown time  . Naproxen-Esomeprazole (VIMOVO) 500-20 MG TBEC Take 1 tablet by mouth 2 (two) times daily. (Patient not taking: Reported on 05/31/2017) 180 tablet 1 Not Taking at Unknown time  . valACYclovir (VALTREX) 1000  MG tablet Take 1 tablet (1,000 mg total) by mouth 2 (two) times daily. Yearly physical due in may must see MD for future refills (Patient taking differently: Take 1,000 mg by mouth 2 (two) times daily as needed (outbreak). Yearly physical due in may must see MD for future refills) 14 tablet 1 unk    Patient Stressors: Educational concerns Financial difficulties Marital or family conflict Occupational concerns Substance abuse  Patient Strengths: Ability for insight Active sense of humor Average or above average intelligence Capable of independent living Curator fund of knowledge Motivation for treatment/growth Supportive family/friends Work skills  Treatment Modalities: Medication Management, Group therapy, Case management,  1 to 1 session with clinician, Psychoeducation, Recreational therapy.   Physician Treatment Plan for Primary Diagnosis: Major depressive disorder, recurrent severe without psychotic features ([place]) Long Term Goal(s): Improvement in symptoms so as ready for discharge Improvement in symptoms so as ready for discharge   Short Term Goals: Ability to identify changes in lifestyle to reduce recurrence of condition will improve Ability to verbalize feelings will improve Ability to disclose and discuss suicidal ideas Ability to demonstrate self-control will improve Ability to identify and develop effective coping behaviors will improve Ability to maintain clinical measurements within normal limits will improve Compliance with prescribed medications will improve Ability to identify triggers associated with substance abuse/mental health issues will improve Ability to identify changes in  lifestyle to reduce recurrence of condition will improve Ability to verbalize feelings will improve Ability to disclose and discuss suicidal ideas Ability to demonstrate self-control will improve Ability to identify and develop effective coping behaviors will  improve Ability to maintain clinical measurements within normal limits will improve Compliance with prescribed medications will improve Ability to identify triggers associated with substance abuse/mental health issues will improve  Medication Management: Evaluate patient's response, side effects, and tolerance of medication regimen.  Therapeutic Interventions: 1 to 1 sessions, Unit Group sessions and Medication administration.  Evaluation of Outcomes: Progressing  Physician Treatment Plan for Secondary Diagnosis: Principal Problem:   Major depressive disorder, recurrent severe without psychotic features ([place]) Active Problems:   Severe recurrent major depression without psychotic features ([place])  Long Term Goal(s): Improvement in symptoms so as ready for discharge Improvement in symptoms so as ready for discharge   Short Term Goals: Ability to identify changes in lifestyle to reduce recurrence of condition will improve Ability to verbalize feelings will improve Ability to disclose and discuss suicidal ideas Ability to demonstrate self-control will improve Ability to identify and develop effective coping behaviors will improve Ability to maintain clinical measurements within normal limits will improve Compliance with prescribed medications will improve Ability to identify triggers associated with substance abuse/mental health issues will improve Ability to identify changes in lifestyle to reduce recurrence of condition will improve Ability to verbalize feelings will improve Ability to disclose and discuss suicidal ideas Ability to demonstrate self-control will improve Ability to identify and develop effective coping behaviors will improve Ability to maintain clinical measurements within normal limits will improve Compliance with prescribed medications will improve Ability to identify triggers associated with substance abuse/mental health issues will improve     Medication Management:  Evaluate patient's response, side effects, and tolerance of medication regimen.  Therapeutic Interventions: 1 to 1 sessions, Unit Group sessions and Medication administration.  Evaluation of Outcomes: Progressing   RN Treatment Plan for Primary Diagnosis: Major depressive disorder, recurrent severe without psychotic features ([place]) Long Term Goal(s): Knowledge of disease and therapeutic regimen to maintain health will improve  Short Term Goals: Ability to identify and develop effective coping behaviors will improve and Compliance with prescribed medications will improve  Medication Management: RN will administer medications as ordered by provider, will assess and evaluate patient's response and provide education to patient for prescribed medication. RN will report any adverse and/or side effects to prescribing provider.  Therapeutic Interventions: 1 on 1 counseling sessions, Psychoeducation, Medication administration, Evaluate responses to treatment, Monitor vital signs and CBGs as ordered, Perform/monitor CIWA, COWS, AIMS and Fall Risk screenings as ordered, Perform wound care treatments as ordered.  Evaluation of Outcomes: Progressing   LCSW Treatment Plan for Primary Diagnosis: Major depressive disorder, recurrent severe without psychotic features ([place]) Long Term Goal(s): Safe transition to appropriate next level of care at discharge, Engage patient in therapeutic group addressing interpersonal concerns.  Short Term Goals: Engage patient in aftercare planning with referrals and resources, Increase social support and Increase skills for wellness and recovery  Therapeutic Interventions: Assess for all discharge needs, 1 to 1 time with Social worker, Explore available resources and support systems, Assess for adequacy in community support network, Educate family and significant other(s) on suicide prevention, Complete Psychosocial Assessment, Interpersonal group therapy.  Evaluation of  Outcomes: Progressing   Progress in Treatment: Attending groups: No. Participating in groups: No. Taking medication as prescribed: Yes. Toleration medication: Yes. Family/Significant other contact made: Yes, individual(s) contacted:  sister Patient understands  diagnosis: Yes. Discussing patient identified problems/goals with staff: Yes. Medical problems stabilized or resolved: Yes. Denies suicidal/homicidal ideation: Yes. Issues/concerns per patient self-inventory: No. Other: none  New problem(s) identified: No, Describe:  none  New Short Term/Long Term Goal(s):  Discharge Plan or Barriers:   Reason for Continuation of Hospitalization: Other; describe none, discharge today  Estimated Length of Stay:discharge today.  Attendees: Patient:Amanda Murray 06/02/2017   Physician: Dr Sanjuana Letters, MD 06/02/2017   Nursing: Grayland Ormond, RN 06/02/2017   RN Care Manager: 06/02/2017   Social Worker: Lurline Idol, LCSW 06/02/2017   Recreational Therapist:  06/02/2017   Other:  06/02/2017   Other:  06/02/2017   Other: 06/02/2017        Scribe for Treatment Team: Joanne Chars, [place] 06/02/2017 11:58 AM

## 2017-06-02 NOTE — BHH Suicide Risk Assessment (Signed)
[place] INPATIENT:  Family/Significant Other Suicide Prevention Education  Suicide Prevention Education:  Education Completed; Laymond Purser, sister, [phone], has been identified by the patient as the family member/significant other with whom the patient will be residing, and identified as the person(s) who will aid the patient in the event of a mental health crisis (suicidal ideations/suicide attempt).  With written consent from the patient, the family member/significant other has been provided the following suicide prevention education, prior to the and/or following the discharge of the patient.  The suicide prevention education provided includes the following:  Suicide risk factors  Suicide prevention and interventions  National Suicide Hotline telephone number  [facility] assessment telephone number  [facility] and/or Residential Mobile Crisis Unit telephone number  Request made of family/significant other to:  Remove weapons (e.g., guns, rifles, knives), all items previously/currently identified as safety concern.  No guns in the home, per Opal Sidles. "She doesn't believe in guns."  Remove drugs/medications (over-the-counter, prescriptions, illicit drugs), all items previously/currently identified as a safety concern.  The family member/significant other verbalizes understanding of the suicide prevention education information provided.  The family member/significant other agrees to remove the items of safety concern listed above.  Opal Sidles feels like pt realizes that what she did "was stupid." She said she does not at all think pt "wants to die" and believes pt "learned her lesson."  She is comfortable with pt being discharged, is a psychologist herself, and will be checking in with pt moving forward.  Joanne Chars, LCSW 06/02/2017, 9:44 AM

## 2017-06-02 NOTE — Progress Notes (Signed)
Recreation Therapy Notes  Date: 06/02/17 Time: 0930 Location: 300 [address] Dayroom  Group Topic: Stress Management  Goal Area(s) Addresses:  Patient will verbalize importance of using healthy stress management.  Patient will identify positive emotions associated with healthy stress management.   Intervention: Stress Management  Activity :  Meditation.  LRT introduced the stress management technique of meditation.  LRT played a meditation from the Calm app dealing with forgiveness of self.  Patients were to follow along with the meditation as it played to engage in the activity.  Education:  Stress Management, Discharge Planning.   Education Outcome: Acknowledges edcuation/In group clarification offered/Needs additional education  Clinical Observations/Feedback: Pt did not attend group.     Victorino Sparrow, LRT/CTRS         Victorino Sparrow A 06/02/2017 12:18 PM

## 2017-06-02 NOTE — BHH Counselor (Signed)
No PSA was completed for this pt as she was discharged prior to 72 hours at [facility]. Winferd Humphrey, MSW, LCSW Clinical Social Worker 06/02/2017 10:35 AM

## 2017-06-02 NOTE — B[facility] Suicide Risk Assessment (Signed)
[facility] Discharge Suicide Risk Assessment   Principal Problem: Major depressive disorder, recurrent severe without psychotic features [facility]) Discharge Diagnoses:  Patient Active Problem List   Diagnosis Date Noted  . Major depressive disorder, recurrent severe without psychotic features ([place]) [F33.2] 06/01/2017  . Severe recurrent major depression without psychotic features ([place]) [F33.2] 06/01/2017  . Panic attacks [F41.0] 12/18/2016  . Left anterior cruciate ligament tear [S83.512A] 06/24/2016  . Hemarthrosis of left knee [M25.062] 06/17/2016  . Possible exposure to STD [Z20.2] 10/10/2015  . Smoker [F17.200] 10/10/2015  . Skin lesion of back [L98.9] 10/10/2015  . Encounter for preventative adult health care exam with abnormal findings [P10.25] 09/30/2013  . Helicobacter pylori ab+ [R76.8] 09/30/2013  . 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 Anxiety and depression [F41.9, F32.9]   . Migraines [G43.909]     Total Time spent with patient: 45 minutes  Musculoskeletal: Strength & Muscle Tone: within normal limits Gait & Station: normal Patient leans: N/A  Psychiatric Specialty Exam: Review of Systems  Constitutional: Negative.   HENT: Negative.   Eyes: Negative.   Respiratory: Negative.   Cardiovascular: Negative.   Gastrointestinal: Negative.   Genitourinary: Negative.   Musculoskeletal: Negative.   Skin: Negative.   Neurological: Negative.   Endo/Heme/Allergies: Negative.   Psychiatric/Behavioral: Negative for depression, hallucinations, memory loss, substance abuse and suicidal ideas. The patient is not nervous/anxious and does not have insomnia.     Blood pressure 132/72, pulse 87, temperature 97.6 F (36.4 C), temperature source Oral, resp. rate 16, height 5\' 11"  (1.803 m), weight 64.6 kg (142 lb 8 oz).Body mass index is 19.87 kg/m.  General Appearance: Neatly dressed, pleasant, engaging well and cooperative. Appropriate behavior. Not in any distress. Good relatedness.  Not internally stimulated.  Eye Contact::  Good  Speech:  Spontaneous, normal prosody. Normal tone and rate.   Volume:  Normal  Mood:  Euthymic  Affect:  Appropriate and Full Range  Thought Process:  Linear  Orientation:  Full (Time, Place, and Person)  Thought Content:  Future oriented. No delusional theme. No preoccupation with violent thoughts. No negative ruminations. No obsession.  No hallucination in any modality.   Suicidal Thoughts:  No  Homicidal Thoughts:  No  Memory:  Immediate;   Good Recent;   Good Remote;   Good  Judgement:  Good  Insight:  Good  Psychomotor Activity:  Normal  Concentration:  Good  Recall:  Good  Fund of Knowledge:Good  Language: Good  Akathisia:  Negative  Handed:    AIMS (if indicated):     Assets:  Communication Skills Desire for Improvement Housing Physical Health Resilience Talents/Skills Transportation Vocational/Educational  Sleep:  Number of Hours: 6.75  Cognition: WNL  ADL's:  Intact   Clinical Assessment::   42 y.o Caucasian female, single, lives alone with her dog, employed. Background history of MDD Recurrent. Presented to the ER via emergency services. Overdosed on thirty pills of Alprazolam. Had alcohol on board. She sent a text of the pills to her boyfriend just before she overdosed. Her boyfriend called for help. She was reported to have stated   "I'm at my whits end ,,,,,try and go to sleep ,,, I wish I never woke up" at the ER.  She expressed financial stressor and problems with her relationship. Routine labs is significant for hypocalcemia and thrombocytosis. QTc 453. Toxicology is negative. UDS is positive for benzodiazepine and THC. BAL56 mg/dl.  Seen today. Says she is doing fine. She has been able to resolve conflicts in  her relationship. She has decided to move on. Says her parents died when she was very young and she did not kill herself. Feels her last relationship is not work harming her for. She is eager to get back to  work.  Reports that she is in good spirits. Her mood has lifted. She is has been sleeping well. She has good appetite. Her energy is back to normal.  She is able to think clearly. She is able to focus on task. Her thoughts are not crowded or racing. No evidence of mania. No hallucination in any modality. She is not making any delusional statement. No passivity of will/thought. She is fully in touch with reality. No thoughts of suicide. No thoughts of homicide. No violent thoughts. No overwhelming anxiety. No access to weapons.   Nursing staff reports that patient has been appropriate on the unit. Patient has been interacting well with peers. No behavioral issues. Patient has not voiced any suicidal thoughts. Patient has not been observed to be internally stimulated. Patient has been adherent with treatment recommendations. Patient has been tolerating their medication well.   Patient was discussed at team. Team members feels that patient is back to her baseline level of function. Team agrees with plan to discharge patient today.  Demographic Factors:  Caucasian and Living alone  Loss Factors: Loss of significant relationship and Financial problems/change in socioeconomic status  Historical Factors: NA  Risk Reduction Factors:   Positive social support, Positive therapeutic relationship, Positive coping skills or problem solving skills and has a dog  Continued Clinical Symptoms:  As above   Cognitive Features That Contribute To Risk:  None    Suicide Risk:  Minimal: No identifiable suicidal ideation.  Patient is not having any thoughts of suicide at this time. Modifiable risk factors targeted during this admission includes depression, adjustment disorder and substance use. Demographical and historical risk factors cannot be modified. Patient is now engaging well. Patient is reliable and is future oriented. We have buffered patient's support structures. At this point, patient is at low risk of  suicide. Patient is aware of the effects of psychoactive substances on decision making process. Patient has been provided with emergency contacts. Patient acknowledges to use resources provided if unforseen circumstances changes their current risk stratification.      Plan Of Care/Follow-up recommendations:  1. Continue current psychotropic medications 2. Mental health and addiction follow up as arranged.  3. Discharge in care of her family 4. Provided limited quantity of prescriptions   [place], MD 06/02/2017, 9:55 AM

## 2017-06-02 NOTE — Progress Notes (Signed)
Discharge Note:  Patient discharged home.  Patient denied SI and HI.  Denied A/V hallucinations.  Suicide prevention information given and discussed with patient who stated she understood and had no questions.  Patient stated she received all her belongings, clothing, toiletries, misc items, prescriptions, etc.  Patient stated she appreciated all assistance from [facility] staff.  All required discharge information given to patient at discharge.

## 2017-08-05 ENCOUNTER — Encounter: Payer: Self-pay | Admitting: Internal Medicine

## 2017-08-07 ENCOUNTER — Other Ambulatory Visit: Payer: Self-pay | Admitting: Internal Medicine

## 2017-08-07 MED ORDER — ESCITALOPRAM OXALATE 20 MG PO TABS: 20.0000 mg | ORAL_TABLET | Freq: Every day | ORAL | 1 refills | Status: DC

## 2017-09-19 DIAGNOSIS — F331 Major depressive disorder, recurrent, moderate: Secondary | ICD-10-CM | POA: Diagnosis not present

## 2017-09-22 DIAGNOSIS — L814 Other melanin hyperpigmentation: Secondary | ICD-10-CM | POA: Diagnosis not present

## 2017-09-22 DIAGNOSIS — D1801 Hemangioma of skin and subcutaneous tissue: Secondary | ICD-10-CM | POA: Diagnosis not present

## 2017-09-22 DIAGNOSIS — B078 Other viral warts: Secondary | ICD-10-CM | POA: Diagnosis not present

## 2017-09-22 DIAGNOSIS — D225 Melanocytic nevi of trunk: Secondary | ICD-10-CM | POA: Diagnosis not present

## 2017-10-01 DIAGNOSIS — Z113 Encounter for screening for infections with a predominantly sexual mode of transmission: Secondary | ICD-10-CM | POA: Diagnosis not present

## 2017-10-01 DIAGNOSIS — Z1231 Encounter for screening mammogram for malignant neoplasm of breast: Secondary | ICD-10-CM | POA: Diagnosis not present

## 2017-10-01 DIAGNOSIS — Z01419 Encounter for gynecological examination (general) (routine) without abnormal findings: Secondary | ICD-10-CM | POA: Diagnosis not present

## 2017-10-01 DIAGNOSIS — Z1159 Encounter for screening for other viral diseases: Secondary | ICD-10-CM | POA: Diagnosis not present

## 2017-10-01 DIAGNOSIS — Z6820 Body mass index (BMI) 20.0-20.9, adult: Secondary | ICD-10-CM | POA: Diagnosis not present

## 2017-10-01 DIAGNOSIS — Z118 Encounter for screening for other infectious and parasitic diseases: Secondary | ICD-10-CM | POA: Diagnosis not present

## 2017-10-01 DIAGNOSIS — Z114 Encounter for screening for human immunodeficiency virus [HIV]: Secondary | ICD-10-CM | POA: Diagnosis not present

## 2017-10-03 DIAGNOSIS — F331 Major depressive disorder, recurrent, moderate: Secondary | ICD-10-CM | POA: Diagnosis not present

## 2017-10-27 ENCOUNTER — Ambulatory Visit (INDEPENDENT_AMBULATORY_CARE_PROVIDER_SITE_OTHER): Payer: BLUE CROSS/BLUE SHIELD | Admitting: Internal Medicine

## 2017-10-27 ENCOUNTER — Encounter: Payer: Self-pay | Admitting: Internal Medicine

## 2017-10-27 ENCOUNTER — Other Ambulatory Visit (INDEPENDENT_AMBULATORY_CARE_PROVIDER_SITE_OTHER): Payer: BLUE CROSS/BLUE SHIELD

## 2017-10-27 VITALS — BP 108/76 | HR 79 | Temp 98.3°F | Ht 71.0 in | Wt 143.0 lb

## 2017-10-27 DIAGNOSIS — Z0000 Encounter for general adult medical examination without abnormal findings: Secondary | ICD-10-CM | POA: Diagnosis not present

## 2017-10-27 DIAGNOSIS — F410 Panic disorder [episodic paroxysmal anxiety] without agoraphobia: Secondary | ICD-10-CM | POA: Diagnosis not present

## 2017-10-27 DIAGNOSIS — Z202 Contact with and (suspected) exposure to infections with a predominantly sexual mode of transmission: Secondary | ICD-10-CM

## 2017-10-27 DIAGNOSIS — F32A Depression, unspecified: Secondary | ICD-10-CM

## 2017-10-27 DIAGNOSIS — F329 Major depressive disorder, single episode, unspecified: Secondary | ICD-10-CM

## 2017-10-27 DIAGNOSIS — Z0001 Encounter for general adult medical examination with abnormal findings: Secondary | ICD-10-CM

## 2017-10-27 DIAGNOSIS — F1011 Alcohol abuse, in remission: Secondary | ICD-10-CM

## 2017-10-27 DIAGNOSIS — F419 Anxiety disorder, unspecified: Secondary | ICD-10-CM

## 2017-10-27 LAB — CBC WITH DIFFERENTIAL/PLATELET
Basophils Absolute: 0.1 10*3/uL (ref 0.0–0.1)
Basophils Relative: 1.2 % (ref 0.0–3.0)
Eosinophils Absolute: 0.2 10*3/uL (ref 0.0–0.7)
Eosinophils Relative: 2.6 % (ref 0.0–5.0)
HCT: 32.6 % — ABNORMAL LOW (ref 36.0–46.0)
Hemoglobin: 10.8 g/dL — ABNORMAL LOW (ref 12.0–15.0)
Lymphocytes Relative: 24.3 % (ref 12.0–46.0)
Lymphs Abs: 1.7 10*3/uL (ref 0.7–4.0)
MCHC: 33.2 g/dL (ref 30.0–36.0)
MCV: 78.7 fl (ref 78.0–100.0)
Monocytes Absolute: 0.7 10*3/uL (ref 0.1–1.0)
Monocytes Relative: 9.2 % (ref 3.0–12.0)
Neutro Abs: 4.5 10*3/uL (ref 1.4–7.7)
Neutrophils Relative %: 62.7 % (ref 43.0–77.0)
Platelets: 241 10*3/uL (ref 150.0–400.0)
RBC: 4.14 Mil/uL (ref 3.87–5.11)
RDW: 16.3 % — ABNORMAL HIGH (ref 11.5–15.5)
WBC: 7.2 10*3/uL (ref 4.0–10.5)

## 2017-10-27 LAB — BASIC METABOLIC PANEL
BUN: 8 mg/dL (ref 6–23)
CO2: 26 mEq/L (ref 19–32)
Calcium: 9.4 mg/dL (ref 8.4–10.5)
Chloride: 104 mEq/L (ref 96–112)
Creatinine, Ser: 0.66 mg/dL (ref 0.40–1.20)
GFR: 104.66 mL/min (ref >60.00)
Glucose, Bld: 116 mg/dL — ABNORMAL HIGH (ref 70–99)
Potassium: 3.3 mEq/L — ABNORMAL LOW (ref 3.5–5.1)
Sodium: 137 mEq/L (ref 135–145)

## 2017-10-27 LAB — URINALYSIS, ROUTINE W REFLEX MICROSCOPIC
Bilirubin Urine: NEGATIVE
Hgb urine dipstick: NEGATIVE
Ketones, ur: NEGATIVE
Leukocytes, UA: NEGATIVE
Nitrite: NEGATIVE
RBC / HPF: NONE SEEN (ref 0–?)
Specific Gravity, Urine: 1.01 (ref 1.000–1.030)
Total Protein, Urine: NEGATIVE
Urine Glucose: NEGATIVE
Urobilinogen, UA: 0.2 (ref 0.0–1.0)
pH: 6.5 (ref 5.0–8.0)

## 2017-10-27 LAB — HEPATIC FUNCTION PANEL
ALT: 21 U/L (ref 0–35)
AST: 18 U/L (ref 0–37)
Albumin: 4.7 g/dL (ref 3.5–5.2)
Alkaline Phosphatase: 49 U/L (ref 39–117)
Bilirubin, Direct: 0 mg/dL (ref 0.0–0.3)
Total Bilirubin: 0.2 mg/dL (ref 0.2–1.2)
Total Protein: 7.3 g/dL (ref 6.0–8.3)

## 2017-10-27 LAB — LIPID PANEL
Cholesterol: 211 mg/dL — ABNORMAL HIGH (ref 0–200)
HDL: 58.1 mg/dL (ref >39.00)
LDL Cholesterol: 122 mg/dL — ABNORMAL HIGH (ref 0–99)
NonHDL: 153.14
Total CHOL/HDL Ratio: 4
Triglycerides: 154 mg/dL — ABNORMAL HIGH (ref 0.0–149.0)
VLDL: 30.8 mg/dL (ref 0.0–40.0)

## 2017-10-27 LAB — TSH: TSH: 1.49 u[IU]/mL (ref 0.35–4.50)

## 2017-10-27 NOTE — Assessment & Plan Note (Signed)

## 2017-10-27 NOTE — Assessment & Plan Note (Signed)
To f/u psych as planned

## 2017-10-27 NOTE — Progress Notes (Signed)
Subjective:    Patient ID: Amanda Murray, female    DOB: [birth year], 42 y.o.   MRN: [id number]  HPI  Here for wellness and f/u;  Overall doing ok;  Pt denies Chest pain, worsening SOB, DOE, wheezing, orthopnea, PND, worsening LE edema, palpitations, dizziness or syncope.  Pt denies neurological change such as new headache, facial or extremity weakness.  Pt denies polydipsia, polyuria, or low sugar symptoms. Pt states overall good compliance with treatment and medications, good tolerability, and has been trying to follow appropriate diet. No fever, night sweats, wt loss, loss of appetite, or other constitutional symptoms.  Pt states good ability with ADL's, has low fall risk, home safety reviewed and adequate, no other significant changes in hearing or vision, and active with exercise a few days per wk.  Asks for STD testing though she has no symptoms or known exposure.   Quit ETOH completely - none since feb 21.  Seeing Dr Samara Snide, now on lexapo 20 increased from 72, and added abilify x 3 wks; she feels herself overall much imrpoved without SI or HI.  Going to [place] regularly with a sponsor.   Wt Readings from Last 3 Encounters:  10/27/17 143 lb (64.9 kg)  05/31/17 140 lb (63.5 kg)  12/18/16 140 lb (63.5 kg)    Past Medical History:  Diagnosis Date  . Anxiety   . Depression   . Helicobacter pylori ab+ 09/30/2013   Treated approx 2008  . Migraines   . 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 UTI (lower urinary tract infection)    No past surgical history on file.  reports that she has been smoking.  She has a 5.00 pack-year smoking history. She has never used smokeless tobacco. She reports that she drinks alcohol. She reports that she has current or past drug history. Drug: Marijuana. Frequency: 3.00 times per week. family history includes Alcohol abuse in her other; Diabetes in her other; Hypertension in her other; Mental illness in her other; Sudden death in her other. No Known Allergies Current  Outpatient Medications on File Prior to Visit  Medication Sig Dispense Refill  . ARIPiprazole (ABILIFY) 2 MG tablet Take 2 mg by mouth daily.    Marland Kitchen escitalopram (LEXAPRO) 20 MG tablet Take 1 tablet (20 mg total) by mouth daily. For mood control 90 tablet 1  . valACYclovir (VALTREX) 1000 MG tablet Take 1 tablet (1,000 mg total) by mouth 2 (two) times daily. Yearly physical due in may must see MD for future refills (Patient taking differently: Take 1,000 mg by mouth 2 (two) times daily as needed (outbreak). Yearly physical due in may must see MD for future refills) 14 tablet 1   No current facility-administered medications on file prior to visit.    Review of Systems Constitutional: Negative for other unusual diaphoresis, sweats, appetite or weight changes HENT: Negative for other worsening hearing loss, ear pain, facial swelling, mouth sores or neck stiffness.   Eyes: Negative for other worsening pain, redness or other visual disturbance.  Respiratory: Negative for other stridor or swelling Cardiovascular: Negative for other palpitations or other chest pain  Gastrointestinal: Negative for worsening diarrhea or loose stools, blood in stool, distention or other pain Genitourinary: Negative for hematuria, flank pain or other change in urine volume.  Musculoskeletal: Negative for myalgias or other joint swelling.  Skin: Negative for other color change, or other wound or worsening drainage.  Neurological: Negative for other syncope or numbness. Hematological: Negative for other adenopathy or swelling Psychiatric/Behavioral: Negative  for hallucinations, other worsening agitation, SI, self-injury, or new decreased concentration All other system neg per pt    Objective:   Physical Exam BP 108/76   Pulse 79   Temp 98.3 F (36.8 C) (Oral)   Ht 5\' 11"  (1.803 m)   Wt 143 lb (64.9 kg)   SpO2 97%   BMI 19.94 kg/m  VS noted,  Constitutional: Pt appears in NAD HENT: Head: NCAT.  Right Ear:  External ear normal.  Left Ear: External ear normal.  Eyes: . Pupils are equal, round, and reactive to light. Conjunctivae and EOM are normal Nose: without d/c or deformity Neck: Neck supple. Gross normal ROM Cardiovascular: Normal rate and regular rhythm.   Pulmonary/Chest: Effort normal and breath sounds without rales or wheezing.  Abd:  Soft, NT, ND, + BS, no organomegaly Neurological: Pt is alert. At baseline orientation, motor grossly intact Skin: Skin is warm. No rashes, other new lesions, no LE edema Psychiatric: Pt behavior is normal without agitation  No other exam findings     Assessment & Plan:

## 2017-10-27 NOTE — Assessment & Plan Note (Signed)
Ok for testing

## 2017-10-27 NOTE — Patient Instructions (Addendum)

## 2017-10-27 NOTE — Assessment & Plan Note (Signed)
Well-controlled on current meds 

## 2017-10-27 NOTE — Assessment & Plan Note (Signed)
To cont AA with sponsor

## 2017-10-28 ENCOUNTER — Telehealth: Payer: Self-pay

## 2017-10-28 LAB — HIV ANTIBODY (ROUTINE TESTING W REFLEX): HIV 1&2 Ab, 4th Generation: NONREACTIVE

## 2017-10-28 LAB — RPR: RPR Ser Ql: NONREACTIVE

## 2017-10-28 NOTE — Telephone Encounter (Signed)
Pt has viewed results on MyChart. 

## 2017-10-28 NOTE — Telephone Encounter (Signed)
-----   Message from Biagio Borg, MD sent at 10/28/2017 12:51 PM EDT ----- Left message on MyChart, pt to cont same tx except  The test results show that your current treatment is OK, except there is a new finding of anemia, likely iron deficiency often seen in young women.  Please consider seeing your GYN if having heavy or irregular menses.  Also we would ask you to start OTC iron sulfate 325 mg - 1 per day for 3 months.    Also the LDL cholesterol is mildly elevated, so please follow a lower cholesterol diet.  [place] to please inform pt

## 2017-11-03 DIAGNOSIS — F331 Major depressive disorder, recurrent, moderate: Secondary | ICD-10-CM | POA: Diagnosis not present

## 2017-12-22 DIAGNOSIS — M79629 Pain in unspecified upper arm: Secondary | ICD-10-CM | POA: Diagnosis not present

## 2017-12-22 DIAGNOSIS — M7989 Other specified soft tissue disorders: Secondary | ICD-10-CM | POA: Diagnosis not present

## 2018-01-13 DIAGNOSIS — F331 Major depressive disorder, recurrent, moderate: Secondary | ICD-10-CM | POA: Diagnosis not present

## 2018-03-13 DIAGNOSIS — Z3202 Encounter for pregnancy test, result negative: Secondary | ICD-10-CM | POA: Diagnosis not present

## 2018-04-10 DIAGNOSIS — F331 Major depressive disorder, recurrent, moderate: Secondary | ICD-10-CM | POA: Diagnosis not present

## 2018-05-08 ENCOUNTER — Encounter: Payer: Self-pay | Admitting: Internal Medicine

## 2018-05-08 MED ORDER — VALACYCLOVIR HCL 1 G PO TABS: 1000.0000 mg | ORAL_TABLET | Freq: Two times a day (BID) | ORAL | 2 refills | Status: DC

## 2018-06-29 DIAGNOSIS — F331 Major depressive disorder, recurrent, moderate: Secondary | ICD-10-CM | POA: Diagnosis not present

## 2018-07-28 DIAGNOSIS — F331 Major depressive disorder, recurrent, moderate: Secondary | ICD-10-CM | POA: Diagnosis not present

## 2018-09-22 DIAGNOSIS — F331 Major depressive disorder, recurrent, moderate: Secondary | ICD-10-CM | POA: Diagnosis not present

## 2018-11-15 DIAGNOSIS — H00011 Hordeolum externum right upper eyelid: Secondary | ICD-10-CM | POA: Diagnosis not present

## 2018-11-25 DIAGNOSIS — Z03818 Encounter for observation for suspected exposure to other biological agents ruled out: Secondary | ICD-10-CM | POA: Diagnosis not present

## 2018-11-30 DIAGNOSIS — F331 Major depressive disorder, recurrent, moderate: Secondary | ICD-10-CM | POA: Diagnosis not present

## 2019-01-21 ENCOUNTER — Other Ambulatory Visit: Payer: Self-pay

## 2019-01-21 DIAGNOSIS — R6889 Other general symptoms and signs: Secondary | ICD-10-CM | POA: Diagnosis not present

## 2019-01-21 DIAGNOSIS — Z20822 Contact with and (suspected) exposure to covid-19: Secondary | ICD-10-CM

## 2019-01-23 LAB — NOVEL CORONAVIRUS, NAA: SARS-CoV-2, NAA: NOT DETECTED

## 2019-02-02 DIAGNOSIS — F331 Major depressive disorder, recurrent, moderate: Secondary | ICD-10-CM | POA: Diagnosis not present

## 2019-02-09 ENCOUNTER — Ambulatory Visit (INDEPENDENT_AMBULATORY_CARE_PROVIDER_SITE_OTHER): Payer: BC Managed Care – PPO | Admitting: Internal Medicine

## 2019-02-09 ENCOUNTER — Other Ambulatory Visit: Payer: Self-pay

## 2019-02-09 ENCOUNTER — Other Ambulatory Visit (INDEPENDENT_AMBULATORY_CARE_PROVIDER_SITE_OTHER): Payer: BC Managed Care – PPO

## 2019-02-09 ENCOUNTER — Encounter: Payer: Self-pay | Admitting: Internal Medicine

## 2019-02-09 VITALS — BP 122/84 | HR 100 | Temp 98.4°F | Ht 71.0 in | Wt 163.0 lb

## 2019-02-09 DIAGNOSIS — E611 Iron deficiency: Secondary | ICD-10-CM | POA: Diagnosis not present

## 2019-02-09 DIAGNOSIS — Z202 Contact with and (suspected) exposure to infections with a predominantly sexual mode of transmission: Secondary | ICD-10-CM

## 2019-02-09 DIAGNOSIS — F17200 Nicotine dependence, unspecified, uncomplicated: Secondary | ICD-10-CM

## 2019-02-09 DIAGNOSIS — J309 Allergic rhinitis, unspecified: Secondary | ICD-10-CM | POA: Diagnosis not present

## 2019-02-09 DIAGNOSIS — Z0000 Encounter for general adult medical examination without abnormal findings: Secondary | ICD-10-CM | POA: Diagnosis not present

## 2019-02-09 DIAGNOSIS — R739 Hyperglycemia, unspecified: Secondary | ICD-10-CM

## 2019-02-09 DIAGNOSIS — Z7689 Persons encountering health services in other specified circumstances: Secondary | ICD-10-CM | POA: Diagnosis not present

## 2019-02-09 DIAGNOSIS — E538 Deficiency of other specified B group vitamins: Secondary | ICD-10-CM | POA: Diagnosis not present

## 2019-02-09 DIAGNOSIS — Z0001 Encounter for general adult medical examination with abnormal findings: Secondary | ICD-10-CM

## 2019-02-09 DIAGNOSIS — D649 Anemia, unspecified: Secondary | ICD-10-CM

## 2019-02-09 DIAGNOSIS — E559 Vitamin D deficiency, unspecified: Secondary | ICD-10-CM

## 2019-02-09 DIAGNOSIS — E785 Hyperlipidemia, unspecified: Secondary | ICD-10-CM

## 2019-02-09 LAB — HEPATIC FUNCTION PANEL
ALT: 14 U/L (ref 0–35)
AST: 15 U/L (ref 0–37)
Albumin: 4.4 g/dL (ref 3.5–5.2)
Alkaline Phosphatase: 67 U/L (ref 39–117)
Bilirubin, Direct: 0 mg/dL (ref 0.0–0.3)
Total Bilirubin: 0.3 mg/dL (ref 0.2–1.2)
Total Protein: 7.4 g/dL (ref 6.0–8.3)

## 2019-02-09 LAB — LIPID PANEL
Cholesterol: 193 mg/dL (ref 0–200)
HDL: 73.7 mg/dL (ref >39.00)
LDL Cholesterol: 100 mg/dL — ABNORMAL HIGH (ref 0–99)
NonHDL: 119.15
Total CHOL/HDL Ratio: 3
Triglycerides: 98 mg/dL (ref 0.0–149.0)
VLDL: 19.6 mg/dL (ref 0.0–40.0)

## 2019-02-09 LAB — URINALYSIS, ROUTINE W REFLEX MICROSCOPIC
Bilirubin Urine: NEGATIVE
Hgb urine dipstick: NEGATIVE
Ketones, ur: NEGATIVE
Leukocytes,Ua: NEGATIVE
Nitrite: POSITIVE — AB
RBC / HPF: NONE SEEN (ref 0–?)
Specific Gravity, Urine: 1.015 (ref 1.000–1.030)
Total Protein, Urine: NEGATIVE
Urine Glucose: NEGATIVE
Urobilinogen, UA: 0.2 (ref 0.0–1.0)
pH: 7 (ref 5.0–8.0)

## 2019-02-09 LAB — BASIC METABOLIC PANEL
BUN: 16 mg/dL (ref 6–23)
CO2: 25 mEq/L (ref 19–32)
Calcium: 9.6 mg/dL (ref 8.4–10.5)
Chloride: 104 mEq/L (ref 96–112)
Creatinine, Ser: 0.79 mg/dL (ref 0.40–1.20)
GFR: 79.53 mL/min (ref >60.00)
Glucose, Bld: 91 mg/dL (ref 70–99)
Potassium: 4.2 mEq/L (ref 3.5–5.1)
Sodium: 136 mEq/L (ref 135–145)

## 2019-02-09 LAB — CBC WITH DIFFERENTIAL/PLATELET
Basophils Absolute: 0.1 10*3/uL (ref 0.0–0.1)
Basophils Relative: 0.8 % (ref 0.0–3.0)
Eosinophils Absolute: 0.1 10*3/uL (ref 0.0–0.7)
Eosinophils Relative: 1.3 % (ref 0.0–5.0)
HCT: 39.8 % (ref 36.0–46.0)
Hemoglobin: 13 g/dL (ref 12.0–15.0)
Lymphocytes Relative: 17 % (ref 12.0–46.0)
Lymphs Abs: 1.3 10*3/uL (ref 0.7–4.0)
MCHC: 32.7 g/dL (ref 30.0–36.0)
MCV: 83.1 fl (ref 78.0–100.0)
Monocytes Absolute: 0.9 10*3/uL (ref 0.1–1.0)
Monocytes Relative: 11.3 % (ref 3.0–12.0)
Neutro Abs: 5.3 10*3/uL (ref 1.4–7.7)
Neutrophils Relative %: 69.6 % (ref 43.0–77.0)
Platelets: 255 10*3/uL (ref 150.0–400.0)
RBC: 4.79 Mil/uL (ref 3.87–5.11)
RDW: 16.6 % — ABNORMAL HIGH (ref 11.5–15.5)
WBC: 7.6 10*3/uL (ref 4.0–10.5)

## 2019-02-09 LAB — IBC PANEL
Iron: 39 ug/dL — ABNORMAL LOW (ref 42–145)
Saturation Ratios: 8.8 % — ABNORMAL LOW (ref 20.0–50.0)
Transferrin: 317 mg/dL (ref 212.0–360.0)

## 2019-02-09 LAB — TSH: TSH: 0.91 u[IU]/mL (ref 0.35–4.50)

## 2019-02-09 LAB — VITAMIN D 25 HYDROXY (VIT D DEFICIENCY, FRACTURES): VITD: 33.3 ng/mL (ref 30.00–100.00)

## 2019-02-09 LAB — HEMOGLOBIN A1C: Hgb A1c MFr Bld: 5.5 % (ref 4.6–6.5)

## 2019-02-09 LAB — VITAMIN B12: Vitamin B-12: 282 pg/mL (ref 211–911)

## 2019-02-09 MED ORDER — CETIRIZINE HCL 10 MG PO TABS: 10.0000 mg | ORAL_TABLET | Freq: Every day | ORAL | 11 refills | Status: DC

## 2019-02-09 MED ORDER — TRIAMCINOLONE ACETONIDE 55 MCG/ACT NA AERO: 2.0000 | INHALATION_SPRAY | Freq: Every day | NASAL | 12 refills | Status: DC

## 2019-02-09 NOTE — Assessment & Plan Note (Signed)

## 2019-02-09 NOTE — Assessment & Plan Note (Addendum)
Mild to mod, likely explains the persistent cough x 1 mo, for zyrtec and nasacort asd,  to f/u any worsening symptoms or concerns  In addition to the time spent performing CPE, I spent an additional 25 minutes face to face,in which greater than 50% of this time was spent in counseling and coordination of care for patient's acute illness as documented, including the differential dx, treatment, further evaluation and other management of allergic rhinitis, hyperglyemia, HLD, smoker, STD exposure, anemia

## 2019-02-09 NOTE — Assessment & Plan Note (Signed)
Not ready to quit, ok for chantix when ready, urged to quit

## 2019-02-09 NOTE — Assessment & Plan Note (Signed)
Asympt, ok for labs as ordered

## 2019-02-09 NOTE — Progress Notes (Signed)
Subjective:    Patient ID: Amanda Murray, female    DOB: Oct 25, 1975, 43 y.o.   MRN: [id number]  HPI  Here for wellness and f/u;  Overall doing ok;  Pt denies Chest pain, worsening SOB, DOE, wheezing, orthopnea, PND, worsening LE edema, palpitations, dizziness or syncope.  Pt denies neurological change such as new headache, facial or extremity weakness.  Pt denies polydipsia, polyuria, or low sugar symptoms. Pt states overall good compliance with treatment and medications, good tolerability, and has been trying to follow appropriate diet.  Pt denies worsening depressive symptoms, suicidal ideation or panic. No fever, night sweats, wt loss, loss of appetite, or other constitutional symptoms.  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 Also has chronic cough x 1 mo with post nasal gtt it seems and Does have several wks ongoing nasal allergy symptoms with clearish congestion, itch and sneezing, without fever, pain, ST, swelling or wheezing.  No recent overt bleeding.  Still smoking, just not ready to consider stopping.  Also asks for repaet yearly STD labs but Denies urinary symptoms such as dysuria, frequency, urgency, flank pain, hematuria or n/v, fever, chills.  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  reports that she has been smoking. She has a 5.00 pack-year smoking history. She has never used smokeless tobacco. She reports current alcohol use. She reports current drug use.  Frequency: 3.00 times per week. Drug: Marijuana. family history includes Alcohol abuse in an other family member; Diabetes in an other family member; Hypertension in an other family member; Mental illness in an other family member; Sudden death in an other family member. No Known Allergies Current Outpatient Medications on File Prior to Visit  Medication Sig Dispense Refill   ARIPiprazole (ABILIFY) 2 MG tablet Take 2 mg by mouth daily.     escitalopram (LEXAPRO) 20 MG tablet Take 1 tablet (20 mg total) by mouth daily. For mood control 90 tablet 1   valACYclovir (VALTREX) 1000 MG tablet Take 1 tablet (1,000 mg total) by mouth 2 (two) times daily. 14 tablet 2   No current facility-administered medications on file prior to visit.    Review of Systems Constitutional: Negative for other unusual diaphoresis, sweats, appetite or weight changes HENT: Negative for other worsening hearing loss, ear pain, facial swelling, mouth sores or neck stiffness.   Eyes: Negative for other worsening pain, redness or other visual disturbance.  Respiratory: Negative for other stridor or swelling Cardiovascular: Negative for other palpitations or other chest pain  Gastrointestinal: Negative for worsening diarrhea or loose stools, blood in stool, distention or other pain Genitourinary: Negative for hematuria, flank pain or other change in urine volume.  Musculoskeletal: Negative for myalgias or other joint swelling.  Skin: Negative for other color change, or other wound or worsening drainage.  Neurological: Negative for other syncope or numbness. Hematological: Negative for other adenopathy or swelling Psychiatric/Behavioral: Negative for  hallucinations, other worsening agitation, SI, self-injury, or new decreased concentration. All otherwise neg per pt    Objective:   Physical Exam BP 122/84    Pulse 100    Temp 98.4 F (36.9 C) (Oral)    Ht 5\' 11"  (1.803 m)    Wt 163 lb (73.9 kg)    SpO2 98%    BMI 22.73  kg/m  VS noted,  Constitutional: Pt is oriented to person, place, and time. Appears well-developed and well-nourished, in no significant distress and comfortable Head: Normocephalic and atraumatic  Eyes: Conjunctivae and EOM are normal. Pupils are equal, round, and reactive to light Right Ear: External ear normal without discharge Left Ear: External ear normal without discharge Nose: Nose without discharge or deformity Mouth/Throat: Oropharynx is without other ulcerations and moist  Neck: Normal range of motion. Neck supple. No JVD present. No tracheal deviation present or significant neck LA or mass Cardiovascular: Normal rate, regular rhythm, normal heart sounds and intact distal pulses.   Pulmonary/Chest: WOB normal and breath sounds without rales or wheezing  Abdominal: Soft. Bowel sounds are normal. NT. No HSM  Musculoskeletal: Normal range of motion. Exhibits no edema Lymphadenopathy: Has no other cervical adenopathy.  Neurological: Pt is alert and oriented to person, place, and time. Pt has normal reflexes. No cranial nerve deficit. Motor grossly intact, Gait intact Skin: Skin is warm and dry. No rash noted or new ulcerations Psychiatric:  Has normal mood and affect. Behavior is normal without agitation All otherwise neg per pt Lab Results  Component Value Date   WBC 7.6 02/09/2019   HGB 13.0 02/09/2019   HCT 39.8 02/09/2019   PLT 255.0 02/09/2019   GLUCOSE 91 02/09/2019   CHOL 193 02/09/2019   TRIG 98.0 02/09/2019   HDL 73.70 02/09/2019   LDLCALC 100 (H) 02/09/2019   ALT 14 02/09/2019   AST 15 02/09/2019   NA 136 02/09/2019   K 4.2 02/09/2019   CL 104 02/09/2019   CREATININE 0.79 02/09/2019   BUN 16 02/09/2019   CO2 25 02/09/2019   TSH 0.91 02/09/2019   HGBA1C 5.5 02/09/2019       Assessment & Plan:

## 2019-02-09 NOTE — Assessment & Plan Note (Signed)
Also for iron level with labs 

## 2019-02-09 NOTE — Assessment & Plan Note (Signed)
stable overall by history and exam, recent data reviewed with pt, and pt to continue medical treatment as before,  to f/u any worsening symptoms or concerns  

## 2019-02-09 NOTE — Assessment & Plan Note (Signed)
Mild, for lower chol diet and f/u lab

## 2019-02-09 NOTE — Patient Instructions (Addendum)
Please take all new medication as prescribed - the zyrtec and nasacort for allergies and hopefully making the cough better  Please continue all other medications as before, and refills have been done if requested.  Please have the pharmacy call with any other refills you may need.  Please continue your efforts at being more active, low cholesterol diet, and weight control.  You are otherwise up to date with prevention measures today.  Please keep your appointments with your specialists as you may have planned  Please go to the LAB in the Basement (turn left off the elevator) for the tests to be done today  You will be contacted by phone if any changes need to be made immediately.  Otherwise, you will receive a letter about your results with an explanation, but please check with MyChart first.  Please remember to sign up for MyChart if you have not done so, as this will be important to you in the future with finding out test results, communicating by private email, and scheduling acute appointments online when needed.  Please return in 1 year for your yearly visit, or sooner if needed

## 2019-02-10 ENCOUNTER — Telehealth: Payer: Self-pay

## 2019-02-10 ENCOUNTER — Other Ambulatory Visit: Payer: Self-pay | Admitting: Internal Medicine

## 2019-02-10 LAB — RPR: RPR Ser Ql: NONREACTIVE

## 2019-02-10 LAB — HIV ANTIBODY (ROUTINE TESTING W REFLEX): HIV 1&2 Ab, 4th Generation: NONREACTIVE

## 2019-02-10 LAB — HSV 2 ANTIBODY, IGG: HSV 2 Glycoprotein G Ab, IgG: <0.9 index

## 2019-02-10 MED ORDER — POLYSACCHARIDE IRON COMPLEX 150 MG PO CAPS: 150.0000 mg | ORAL_CAPSULE | Freq: Every day | ORAL | 1 refills | Status: DC

## 2019-02-10 NOTE — Telephone Encounter (Signed)
Pt has viewed results via MyChart  

## 2019-02-10 NOTE — Telephone Encounter (Signed)
-----   Message from Biagio Borg, MD sent at 02/10/2019 11:28 AM EDT ----- Left message on MyChart, pt to cont same tx except  The test results show that your current treatment is OK, except the iron test is low.  Please take Nu-iron 150 mg daily for 3 months. I will send the prescription, and you should hear from the office as well.  Shirron to please inform pt, I will do rx

## 2019-02-11 LAB — GC/CHLAMYDIA PROBE AMP
Chlamydia trachomatis, NAA: NEGATIVE
Neisseria Gonorrhoeae by PCR: NEGATIVE

## 2019-03-02 ENCOUNTER — Other Ambulatory Visit: Payer: Self-pay

## 2019-03-02 DIAGNOSIS — Z20822 Contact with and (suspected) exposure to covid-19: Secondary | ICD-10-CM

## 2019-03-02 DIAGNOSIS — Z20828 Contact with and (suspected) exposure to other viral communicable diseases: Secondary | ICD-10-CM | POA: Diagnosis not present

## 2019-03-03 LAB — NOVEL CORONAVIRUS, NAA: SARS-CoV-2, NAA: DETECTED — AB

## 2019-03-12 ENCOUNTER — Other Ambulatory Visit: Payer: Self-pay

## 2019-03-12 DIAGNOSIS — Z20822 Contact with and (suspected) exposure to covid-19: Secondary | ICD-10-CM

## 2019-03-13 LAB — NOVEL CORONAVIRUS, NAA: SARS-CoV-2, NAA: NOT DETECTED

## 2019-04-03 DIAGNOSIS — F331 Major depressive disorder, recurrent, moderate: Secondary | ICD-10-CM | POA: Diagnosis not present

## 2019-06-21 DIAGNOSIS — F331 Major depressive disorder, recurrent, moderate: Secondary | ICD-10-CM | POA: Diagnosis not present

## 2019-07-05 DIAGNOSIS — F331 Major depressive disorder, recurrent, moderate: Secondary | ICD-10-CM | POA: Diagnosis not present

## 2019-07-08 DIAGNOSIS — Z3689 Encounter for other specified antenatal screening: Secondary | ICD-10-CM | POA: Diagnosis not present

## 2019-07-08 DIAGNOSIS — O209 Hemorrhage in early pregnancy, unspecified: Secondary | ICD-10-CM | POA: Diagnosis not present

## 2019-07-08 DIAGNOSIS — Z3200 Encounter for pregnancy test, result unknown: Secondary | ICD-10-CM | POA: Diagnosis not present

## 2019-07-08 DIAGNOSIS — Z3A01 Less than 8 weeks gestation of pregnancy: Secondary | ICD-10-CM | POA: Diagnosis not present

## 2019-07-15 DIAGNOSIS — O360110 Maternal care for anti-D [Rh] antibodies, first trimester, not applicable or unspecified: Secondary | ICD-10-CM | POA: Diagnosis not present

## 2019-07-15 DIAGNOSIS — O209 Hemorrhage in early pregnancy, unspecified: Secondary | ICD-10-CM | POA: Diagnosis not present

## 2019-07-15 DIAGNOSIS — Z3A01 Less than 8 weeks gestation of pregnancy: Secondary | ICD-10-CM | POA: Diagnosis not present

## 2019-07-16 DIAGNOSIS — O209 Hemorrhage in early pregnancy, unspecified: Secondary | ICD-10-CM | POA: Diagnosis not present

## 2019-07-20 DIAGNOSIS — O021 Missed abortion: Secondary | ICD-10-CM | POA: Diagnosis not present

## 2019-07-29 DIAGNOSIS — O021 Missed abortion: Secondary | ICD-10-CM | POA: Diagnosis not present

## 2019-08-25 DIAGNOSIS — F331 Major depressive disorder, recurrent, moderate: Secondary | ICD-10-CM | POA: Diagnosis not present

## 2019-09-03 DIAGNOSIS — F331 Major depressive disorder, recurrent, moderate: Secondary | ICD-10-CM | POA: Diagnosis not present

## 2019-09-11 DIAGNOSIS — G8921 Chronic pain due to trauma: Secondary | ICD-10-CM | POA: Insufficient documentation

## 2019-09-12 ENCOUNTER — Encounter (HOSPITAL_COMMUNITY): Payer: Self-pay

## 2019-09-12 ENCOUNTER — Emergency Department (HOSPITAL_COMMUNITY): Payer: BC Managed Care – PPO

## 2019-09-12 ENCOUNTER — Other Ambulatory Visit: Payer: Self-pay

## 2019-09-12 ENCOUNTER — Emergency Department (HOSPITAL_COMMUNITY)
Admission: EM | Admit: 2019-09-12 | Discharge: 2019-09-12 | Disposition: A | Discharge status: Home / Self Care | Payer: BC Managed Care – PPO | Attending: Emergency Medicine | Admitting: Emergency Medicine

## 2019-09-12 DIAGNOSIS — Y999 Unspecified external cause status: Secondary | ICD-10-CM | POA: Insufficient documentation

## 2019-09-12 DIAGNOSIS — Y929 Unspecified place or not applicable: Secondary | ICD-10-CM | POA: Diagnosis not present

## 2019-09-12 DIAGNOSIS — F17210 Nicotine dependence, cigarettes, uncomplicated: Secondary | ICD-10-CM | POA: Insufficient documentation

## 2019-09-12 DIAGNOSIS — S0990XA Unspecified injury of head, initial encounter: Secondary | ICD-10-CM | POA: Diagnosis not present

## 2019-09-12 DIAGNOSIS — S0003XA Contusion of scalp, initial encounter: Secondary | ICD-10-CM | POA: Diagnosis not present

## 2019-09-12 DIAGNOSIS — Y939 Activity, unspecified: Secondary | ICD-10-CM | POA: Diagnosis not present

## 2019-09-12 DIAGNOSIS — Z79899 Other long term (current) drug therapy: Secondary | ICD-10-CM | POA: Diagnosis not present

## 2019-09-12 DIAGNOSIS — S199XXA Unspecified injury of neck, initial encounter: Secondary | ICD-10-CM | POA: Diagnosis not present

## 2019-09-12 MED ORDER — TETANUS-DIPHTH-ACELL PERTUSSIS 5-2.5-18.5 LF-MCG/0.5 IM SUSP: 0.5000 mL | Freq: Once | INTRAMUSCULAR | Status: DC
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
## 2019-09-12 NOTE — ED Notes (Signed)
Pt ambulatory with a steady gait. She is tolerating water PO.
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
## 2019-09-12 NOTE — Discharge Instructions (Signed)
Your CT is negative for injury inside your brain or fracture.  He may use Tylenol or ibuprofen as needed for aches and pain.  As we discussed headache, dizziness and nausea may persist for several weeks because you have a concussion.  Follow-up with your doctor.  Return to the ED with worsening pain, numbness, tingling, vomiting, focal weakness, behavior change or other concerns

## 2019-10-02 DIAGNOSIS — F331 Major depressive disorder, recurrent, moderate: Secondary | ICD-10-CM | POA: Diagnosis not present

## 2019-10-21 DIAGNOSIS — Z1231 Encounter for screening mammogram for malignant neoplasm of breast: Secondary | ICD-10-CM | POA: Diagnosis not present

## 2019-10-21 DIAGNOSIS — Z01419 Encounter for gynecological examination (general) (routine) without abnormal findings: Secondary | ICD-10-CM | POA: Diagnosis not present

## 2019-10-21 DIAGNOSIS — R87810 Cervical high risk human papillomavirus (HPV) DNA test positive: Secondary | ICD-10-CM | POA: Diagnosis not present

## 2019-10-21 DIAGNOSIS — Z6823 Body mass index (BMI) 23.0-23.9, adult: Secondary | ICD-10-CM | POA: Diagnosis not present

## 2019-10-21 DIAGNOSIS — Z1151 Encounter for screening for human papillomavirus (HPV): Secondary | ICD-10-CM | POA: Diagnosis not present

## 2019-10-21 DIAGNOSIS — Z118 Encounter for screening for other infectious and parasitic diseases: Secondary | ICD-10-CM | POA: Diagnosis not present

## 2019-11-03 DIAGNOSIS — F331 Major depressive disorder, recurrent, moderate: Secondary | ICD-10-CM | POA: Diagnosis not present

## 2019-12-02 DIAGNOSIS — F19120 Other psychoactive substance abuse with intoxication, uncomplicated: Secondary | ICD-10-CM | POA: Diagnosis not present

## 2019-12-02 DIAGNOSIS — F331 Major depressive disorder, recurrent, moderate: Secondary | ICD-10-CM | POA: Diagnosis not present

## 2019-12-24 DIAGNOSIS — F331 Major depressive disorder, recurrent, moderate: Secondary | ICD-10-CM | POA: Diagnosis not present

## 2019-12-24 DIAGNOSIS — F19120 Other psychoactive substance abuse with intoxication, uncomplicated: Secondary | ICD-10-CM | POA: Diagnosis not present

## 2020-01-07 DIAGNOSIS — F19120 Other psychoactive substance abuse with intoxication, uncomplicated: Secondary | ICD-10-CM | POA: Diagnosis not present

## 2020-01-07 DIAGNOSIS — F331 Major depressive disorder, recurrent, moderate: Secondary | ICD-10-CM | POA: Diagnosis not present

## 2020-02-10 ENCOUNTER — Encounter: Payer: BC Managed Care – PPO | Admitting: Internal Medicine

## 2020-03-09 ENCOUNTER — Encounter: Payer: Self-pay | Admitting: Internal Medicine

## 2020-03-09 ENCOUNTER — Ambulatory Visit (INDEPENDENT_AMBULATORY_CARE_PROVIDER_SITE_OTHER): Payer: PRIVATE HEALTH INSURANCE | Admitting: Internal Medicine

## 2020-03-09 ENCOUNTER — Other Ambulatory Visit: Payer: Self-pay

## 2020-03-09 VITALS — BP 118/72 | HR 113 | Temp 99.0°F | Ht 71.0 in | Wt 162.0 lb

## 2020-03-09 DIAGNOSIS — Z23 Encounter for immunization: Secondary | ICD-10-CM | POA: Diagnosis not present

## 2020-03-09 DIAGNOSIS — Z202 Contact with and (suspected) exposure to infections with a predominantly sexual mode of transmission: Secondary | ICD-10-CM

## 2020-03-09 DIAGNOSIS — B001 Herpesviral vesicular dermatitis: Secondary | ICD-10-CM

## 2020-03-09 DIAGNOSIS — Z0000 Encounter for general adult medical examination without abnormal findings: Secondary | ICD-10-CM | POA: Diagnosis not present

## 2020-03-09 DIAGNOSIS — F419 Anxiety disorder, unspecified: Secondary | ICD-10-CM

## 2020-03-09 DIAGNOSIS — E611 Iron deficiency: Secondary | ICD-10-CM

## 2020-03-09 DIAGNOSIS — Z0001 Encounter for general adult medical examination with abnormal findings: Secondary | ICD-10-CM | POA: Diagnosis not present

## 2020-03-09 DIAGNOSIS — F32A Depression, unspecified: Secondary | ICD-10-CM

## 2020-03-09 DIAGNOSIS — R739 Hyperglycemia, unspecified: Secondary | ICD-10-CM | POA: Diagnosis not present

## 2020-03-09 DIAGNOSIS — E785 Hyperlipidemia, unspecified: Secondary | ICD-10-CM | POA: Diagnosis not present

## 2020-03-09 LAB — CBC WITH DIFFERENTIAL/PLATELET
Basophils Absolute: 0.1 10*3/uL (ref 0.0–0.1)
Basophils Relative: 1 % (ref 0.0–3.0)
Eosinophils Absolute: 0.1 10*3/uL (ref 0.0–0.7)
Eosinophils Relative: 1.2 % (ref 0.0–5.0)
HCT: 35.9 % — ABNORMAL LOW (ref 36.0–46.0)
Hemoglobin: 11.5 g/dL — ABNORMAL LOW (ref 12.0–15.0)
Lymphocytes Relative: 18.2 % (ref 12.0–46.0)
Lymphs Abs: 1.6 10*3/uL (ref 0.7–4.0)
MCHC: 32.1 g/dL (ref 30.0–36.0)
MCV: 72.8 fl — ABNORMAL LOW (ref 78.0–100.0)
Monocytes Absolute: 0.8 10*3/uL (ref 0.1–1.0)
Monocytes Relative: 8.8 % (ref 3.0–12.0)
Neutro Abs: 6.3 10*3/uL (ref 1.4–7.7)
Neutrophils Relative %: 70.8 % (ref 43.0–77.0)
Platelets: 281 10*3/uL (ref 150.0–400.0)
RBC: 4.94 Mil/uL (ref 3.87–5.11)
RDW: 16.5 % — ABNORMAL HIGH (ref 11.5–15.5)
WBC: 9 10*3/uL (ref 4.0–10.5)

## 2020-03-09 LAB — HEPATIC FUNCTION PANEL
ALT: 25 U/L (ref 0–35)
AST: 18 U/L (ref 0–37)
Albumin: 4.5 g/dL (ref 3.5–5.2)
Alkaline Phosphatase: 66 U/L (ref 39–117)
Bilirubin, Direct: 0.1 mg/dL (ref 0.0–0.3)
Total Bilirubin: 0.2 mg/dL (ref 0.2–1.2)
Total Protein: 6.9 g/dL (ref 6.0–8.3)

## 2020-03-09 LAB — BASIC METABOLIC PANEL
BUN: 12 mg/dL (ref 6–23)
CO2: 28 mEq/L (ref 19–32)
Calcium: 8.8 mg/dL (ref 8.4–10.5)
Chloride: 105 mEq/L (ref 96–112)
Creatinine, Ser: 0.71 mg/dL (ref 0.40–1.20)
GFR: 103.85 mL/min (ref >60.00)
Glucose, Bld: 88 mg/dL (ref 70–99)
Potassium: 4 mEq/L (ref 3.5–5.1)
Sodium: 137 mEq/L (ref 135–145)

## 2020-03-09 LAB — URINALYSIS, ROUTINE W REFLEX MICROSCOPIC
Bilirubin Urine: NEGATIVE
Hgb urine dipstick: NEGATIVE
Leukocytes,Ua: NEGATIVE
Nitrite: POSITIVE — AB
RBC / HPF: NONE SEEN (ref 0–?)
Specific Gravity, Urine: 1.015 (ref 1.000–1.030)
Total Protein, Urine: NEGATIVE
Urine Glucose: NEGATIVE
Urobilinogen, UA: 0.2 (ref 0.0–1.0)
pH: 6.5 (ref 5.0–8.0)

## 2020-03-09 LAB — LDL CHOLESTEROL, DIRECT: Direct LDL: 109 mg/dL

## 2020-03-09 LAB — LIPID PANEL
Cholesterol: 184 mg/dL (ref 0–200)
HDL: 51.3 mg/dL (ref >39.00)
NonHDL: 132.54
Total CHOL/HDL Ratio: 4
Triglycerides: 202 mg/dL — ABNORMAL HIGH (ref 0.0–149.0)
VLDL: 40.4 mg/dL — ABNORMAL HIGH (ref 0.0–40.0)

## 2020-03-09 LAB — TSH: TSH: 1.47 u[IU]/mL (ref 0.35–4.50)

## 2020-03-09 LAB — HEMOGLOBIN A1C: Hgb A1c MFr Bld: 5.8 % (ref 4.6–6.5)

## 2020-03-09 MED ORDER — VALACYCLOVIR HCL 1 G PO TABS: 1000.0000 mg | ORAL_TABLET | Freq: Two times a day (BID) | ORAL | 2 refills | Status: DC

## 2020-03-09 NOTE — Assessment & Plan Note (Signed)
For std testing °
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
## 2020-03-09 NOTE — Assessment & Plan Note (Signed)

## 2020-03-09 NOTE — Assessment & Plan Note (Signed)
For f/u lab 

## 2020-03-09 NOTE — Assessment & Plan Note (Signed)
For valtrex refill

## 2020-03-09 NOTE — Patient Instructions (Signed)
You had the Tdap tetanus shot today  Please continue all other medications as before, and refills have been done if requested.  Please have the pharmacy call with any other refills you may need.  Please continue your efforts at being more active, low cholesterol diet, and weight control.  You are otherwise up to date with prevention measures today.  Please keep your appointments with your specialists as you may have planned  Please go to the LAB at the blood drawing area for the tests to be done  You will be contacted by phone if any changes need to be made immediately.  Otherwise, you will receive a letter about your results with an explanation, but please check with MyChart first.  Please remember to sign up for MyChart if you have not done so, as this will be important to you in the future with finding out test results, communicating by private email, and scheduling acute appointments online when needed.  Please make an Appointment to return for your 1 year visit, or sooner if needed

## 2020-03-09 NOTE — Assessment & Plan Note (Signed)
stable overall by history and exam, recent data reviewed with pt, and pt to continue medical treatment as before,  to f/u any worsening symptoms or concerns  

## 2020-03-10 ENCOUNTER — Encounter: Payer: Self-pay | Admitting: Internal Medicine

## 2020-03-10 LAB — RPR: RPR Ser Ql: NONREACTIVE

## 2020-03-10 LAB — HIV ANTIBODY (ROUTINE TESTING W REFLEX): HIV Screen 4th Generation wRfx: NONREACTIVE

## 2020-03-10 LAB — HSV 2 ANTIBODY, IGG: HSV 2 IgG, Type Spec: <0.91 index (ref 0.00–0.90)

## 2020-03-14 ENCOUNTER — Encounter: Payer: Self-pay | Admitting: Internal Medicine

## 2020-03-14 LAB — GC/CHLAMYDIA PROBE AMP
Chlamydia trachomatis, NAA: NEGATIVE
Neisseria Gonorrhoeae by PCR: NEGATIVE

## 2020-04-02 ENCOUNTER — Encounter: Payer: Self-pay | Admitting: Internal Medicine

## 2020-04-05 ENCOUNTER — Other Ambulatory Visit: Payer: Self-pay

## 2020-04-05 ENCOUNTER — Ambulatory Visit: Payer: PRIVATE HEALTH INSURANCE | Admitting: Internal Medicine

## 2020-04-12 ENCOUNTER — Encounter: Payer: Self-pay | Admitting: Internal Medicine

## 2020-04-12 ENCOUNTER — Ambulatory Visit (INDEPENDENT_AMBULATORY_CARE_PROVIDER_SITE_OTHER): Payer: PRIVATE HEALTH INSURANCE | Admitting: Internal Medicine

## 2020-04-12 ENCOUNTER — Other Ambulatory Visit: Payer: Self-pay

## 2020-04-12 DIAGNOSIS — F32A Depression, unspecified: Secondary | ICD-10-CM

## 2020-04-12 DIAGNOSIS — R739 Hyperglycemia, unspecified: Secondary | ICD-10-CM | POA: Diagnosis not present

## 2020-04-12 DIAGNOSIS — F419 Anxiety disorder, unspecified: Secondary | ICD-10-CM

## 2020-04-12 DIAGNOSIS — R1310 Dysphagia, unspecified: Secondary | ICD-10-CM | POA: Diagnosis not present

## 2020-04-12 DIAGNOSIS — I889 Nonspecific lymphadenitis, unspecified: Secondary | ICD-10-CM | POA: Diagnosis not present

## 2020-04-12 MED ORDER — AMOXICILLIN-POT CLAVULANATE 875-125 MG PO TABS: 1.0000 | ORAL_TABLET | Freq: Two times a day (BID) | ORAL | 0 refills | Status: DC
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
## 2020-04-12 NOTE — Patient Instructions (Signed)
Please take all new medication as prescribed - the antibiotic  Please continue all other medications as before, and refills have been done if requested.  Please have the pharmacy call with any other refills you may need.  Please continue your efforts at being more active, low cholesterol diet, and weight control.  Please keep your appointments with your specialists as you may have planned    

## 2020-04-16 ENCOUNTER — Encounter: Payer: Self-pay | Admitting: Internal Medicine

## 2020-04-16 DIAGNOSIS — R1310 Dysphagia, unspecified: Secondary | ICD-10-CM | POA: Insufficient documentation

## 2020-04-16 DIAGNOSIS — I889 Nonspecific lymphadenitis, unspecified: Secondary | ICD-10-CM | POA: Insufficient documentation

## 2020-04-16 NOTE — Assessment & Plan Note (Addendum)
To neck bilateral submandibular, likely explains her symptoms, Mild to mod, for antibx course,  to f/u any worsening symptoms or concerns  I spent 31 minutes in preparing to see the patient by review of recent labs, imaging and procedures, obtaining and reviewing separately obtained history, communicating with the patient and family or caregiver, ordering medications, tests or procedures, and documenting clinical information in the EHR including the differential Dx, treatment, and any further evaluation and other management of lymphadenitis, odynophagia, hyperglycemia, anxiety

## 2020-04-16 NOTE — Assessment & Plan Note (Signed)
stable overall by history and exam, recent data reviewed with pt, and pt to continue medical treatment as before,  to f/u any worsening symptoms or concerns  

## 2020-04-16 NOTE — Assessment & Plan Note (Signed)
Exam o/w benign, suspect related to neck lymphadenitis, consider GI if not improved
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
## 2020-05-15 DIAGNOSIS — F331 Major depressive disorder, recurrent, moderate: Secondary | ICD-10-CM | POA: Diagnosis not present

## 2020-05-15 DIAGNOSIS — F19120 Other psychoactive substance abuse with intoxication, uncomplicated: Secondary | ICD-10-CM | POA: Diagnosis not present

## 2020-05-24 DIAGNOSIS — F331 Major depressive disorder, recurrent, moderate: Secondary | ICD-10-CM | POA: Diagnosis not present

## 2020-05-24 DIAGNOSIS — F19120 Other psychoactive substance abuse with intoxication, uncomplicated: Secondary | ICD-10-CM | POA: Diagnosis not present

## 2020-06-07 DIAGNOSIS — F19120 Other psychoactive substance abuse with intoxication, uncomplicated: Secondary | ICD-10-CM | POA: Diagnosis not present

## 2020-06-07 DIAGNOSIS — F331 Major depressive disorder, recurrent, moderate: Secondary | ICD-10-CM | POA: Diagnosis not present

## 2020-06-23 DIAGNOSIS — Z20822 Contact with and (suspected) exposure to covid-19: Secondary | ICD-10-CM | POA: Diagnosis not present

## 2020-06-26 DIAGNOSIS — F319 Bipolar disorder, unspecified: Secondary | ICD-10-CM | POA: Diagnosis not present

## 2020-06-26 DIAGNOSIS — F1020 Alcohol dependence, uncomplicated: Secondary | ICD-10-CM | POA: Diagnosis not present

## 2020-07-04 DIAGNOSIS — F319 Bipolar disorder, unspecified: Secondary | ICD-10-CM | POA: Diagnosis not present

## 2020-07-04 DIAGNOSIS — F1020 Alcohol dependence, uncomplicated: Secondary | ICD-10-CM | POA: Diagnosis not present

## 2020-07-11 DIAGNOSIS — F19120 Other psychoactive substance abuse with intoxication, uncomplicated: Secondary | ICD-10-CM | POA: Diagnosis not present

## 2020-07-11 DIAGNOSIS — F1020 Alcohol dependence, uncomplicated: Secondary | ICD-10-CM | POA: Diagnosis not present

## 2020-07-11 DIAGNOSIS — F319 Bipolar disorder, unspecified: Secondary | ICD-10-CM | POA: Diagnosis not present

## 2020-07-11 DIAGNOSIS — F331 Major depressive disorder, recurrent, moderate: Secondary | ICD-10-CM | POA: Diagnosis not present

## 2020-07-18 DIAGNOSIS — F319 Bipolar disorder, unspecified: Secondary | ICD-10-CM | POA: Diagnosis not present

## 2020-07-18 DIAGNOSIS — F1020 Alcohol dependence, uncomplicated: Secondary | ICD-10-CM | POA: Diagnosis not present

## 2020-07-25 DIAGNOSIS — F319 Bipolar disorder, unspecified: Secondary | ICD-10-CM | POA: Diagnosis not present

## 2020-07-25 DIAGNOSIS — F1020 Alcohol dependence, uncomplicated: Secondary | ICD-10-CM | POA: Diagnosis not present

## 2020-07-31 DIAGNOSIS — M9905 Segmental and somatic dysfunction of pelvic region: Secondary | ICD-10-CM | POA: Diagnosis not present

## 2020-07-31 DIAGNOSIS — M9903 Segmental and somatic dysfunction of lumbar region: Secondary | ICD-10-CM | POA: Diagnosis not present

## 2020-07-31 DIAGNOSIS — M62830 Muscle spasm of back: Secondary | ICD-10-CM | POA: Diagnosis not present

## 2020-07-31 DIAGNOSIS — M9902 Segmental and somatic dysfunction of thoracic region: Secondary | ICD-10-CM | POA: Diagnosis not present

## 2020-08-01 DIAGNOSIS — F1020 Alcohol dependence, uncomplicated: Secondary | ICD-10-CM | POA: Diagnosis not present

## 2020-08-01 DIAGNOSIS — F331 Major depressive disorder, recurrent, moderate: Secondary | ICD-10-CM | POA: Diagnosis not present

## 2020-08-01 DIAGNOSIS — F319 Bipolar disorder, unspecified: Secondary | ICD-10-CM | POA: Diagnosis not present

## 2020-08-01 DIAGNOSIS — F19120 Other psychoactive substance abuse with intoxication, uncomplicated: Secondary | ICD-10-CM | POA: Diagnosis not present

## 2020-08-02 ENCOUNTER — Encounter: Payer: Self-pay | Admitting: Internal Medicine

## 2020-08-03 NOTE — Progress Notes (Signed)
Subjective:    Patient ID: Amanda Murray, female    DOB: [birth year], 45 y.o.   MRN: [id number]  HPI The patient is here for an acute visit for possible shingles, hip pain.   She has left hip mainly.  Yesterday and today she has pain in her right hip. Several years ago she injured her left hip.  The pain from her left hip feels like it did then.  It is tight, it pops and cracks and hurts constantly. She does have some pain that goes down her leg.  The pain varies depending on what she does.    Monday she saw her chiropractor.  He suggested she see Dr Paulla Fore - she has an appointment next Wednesday.     She thinks she had shingles recently.  She is under a lot of stress.   She states headaches.  She thinks it may be stress.  Those have resolved.   He takes naproxen as needed.  It does not help much.    She brings up several other chronic problems, but her reason for today's visit is pain relief from the hip pain.  Her lower back hurts as well.  The right hip is starting to hurt.   Medications and allergies reviewed with patient and updated if appropriate.  Patient Active Problem List   Diagnosis Date Noted  . Lymphadenitis 04/16/2020  . Odynophagia 04/16/2020  . Iron deficiency 03/09/2020  . Allergic rhinitis 02/09/2019  . Hyperglycemia 02/09/2019  . HLD (hyperlipidemia) 02/09/2019  . Anemia 02/09/2019  . Alcohol use disorder, mild, 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/27/2017  . Major depressive disorder, recurrent severe without psychotic features ([place]) 06/01/2017  . Severe recurrent major depression without psychotic features ([place]) 06/01/2017  . Panic attacks 12/18/2016  . Left anterior cruciate ligament tear 06/24/2016  . Hemarthrosis of left knee 06/17/2016  . Possible exposure to STD 10/10/2015  . Smoker 10/10/2015  . Skin lesion of back 10/10/2015  . Preventative health care 09/30/2013  . Helicobacter pylori ab+ 09/30/2013  . 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 Anxiety and depression    . Migraines     Current Outpatient Medications on File Prior to Visit  Medication Sig Dispense Refill  . acetaminophen (TYLENOL) 120 MG suppository Place rectally.    Marland Kitchen amoxicillin-clavulanate (AUGMENTIN) 875-125 MG tablet Take 1 tablet by mouth 2 (two) times daily. 20 tablet 0  . amphetamine-dextroamphetamine (ADDERALL XR) 15 MG 24 hr capsule Take by mouth daily.    . busPIRone (BUSPAR) 10 MG tablet Take 10 mg by mouth 3 (three) times daily.    . cyclobenzaprine (FLEXERIL) 5 MG tablet Take 5 mg by mouth 3 (three) times daily as needed.    . gabapentin (NEURONTIN) 800 MG tablet Take 800 mg by mouth as needed.    . iron polysaccharides (NU-IRON) 150 MG capsule Take 1 capsule (150 mg total) by mouth daily. 90 capsule 1  . naproxen (NAPROSYN) 500 MG tablet Take 500 mg by mouth 2 (two) times daily.    . traZODone (DESYREL) 50 MG tablet Take 50 mg by mouth at bedtime.    . valACYclovir (VALTREX) 1000 MG tablet Take 1 tablet (1,000 mg total) by mouth 2 (two) times daily. 14 tablet 2  . venlafaxine XR (EFFEXOR-XR) 150 MG 24 hr capsule Take 150 mg by mouth daily.    Marland Kitchen venlafaxine (EFFEXOR) 100 MG tablet Take 225 mg by mouth daily. (Patient not taking: Reported on 08/04/2020)     No current  facility-administered medications on file prior to visit.    Past Medical History:  Diagnosis Date  . Anxiety   . Depression   . Helicobacter pylori ab+ 09/30/2013   Treated approx 2008  . Migraines   . 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 UTI (lower urinary tract infection)     History reviewed. No pertinent surgical history.  Social History   Socioeconomic History  . Marital status: Single    Spouse name: Not on file  . Number of children: Not on file  . Years of education: college  . Highest education level: Not on file  Occupational History  . Occupation: Hair Stylist  Tobacco Use  . Smoking status: Current Every Day Smoker    Packs/day: 0.50    Years: 10.00    Pack years: 5.00  .  Smokeless tobacco: Never Used  Vaping Use  . Vaping Use: Never used  Substance and Sexual Activity  . Alcohol use: Yes    Comment: couple glasses 3x week  . Drug use: Yes    Frequency: 3.0 times per week    Types: Marijuana    Comment: small amount 3 x week  . Sexual activity: Yes    Birth control/protection: None  Other Topics Concern  . Not on file  Social History Narrative  . Not on file   Social Determinants of Health   Financial Resource Strain: Not on file  Food Insecurity: Not on file  Transportation Needs: Not on file  Physical Activity: Not on file  Stress: Not on file  Social Connections: Not on file    Family History  Problem Relation Age of Onset  . Alcohol abuse Other   . Mental illness Other   . Sudden death Other   . Hypertension Other   . Diabetes Other     Review of Systems  Constitutional: Negative for fever.  Genitourinary: Positive for difficulty urinating. Negative for dysuria.  Musculoskeletal: Positive for arthralgias and back pain.  Neurological: Positive for numbness (sometimes down both legs) and headaches.       Objective:   Vitals:   08/04/20 1321  BP: 106/74  Pulse: 80  Temp: 98.5 F (36.9 C)  SpO2: 99%   BP Readings from Last 3 Encounters:  08/04/20 106/74  04/12/20 100/70  03/09/20 118/72   Wt Readings from Last 3 Encounters:  08/04/20 167 lb (75.8 kg)  04/12/20 168 lb (76.2 kg)  03/09/20 162 lb (73.5 kg)   Body mass index is 23.29 kg/m.   Physical Exam Constitutional:      General: She is not in acute distress (uncomfortable from pain).    Appearance: Normal appearance. She is not ill-appearing.  HENT:     Head: Normocephalic and atraumatic.  Musculoskeletal:        General: Tenderness (across lower back, b/l hips with palpation) present. No deformity.     Right lower leg: No edema.     Left lower leg: No edema.     Comments: Pain in lower and hips with flexion of spine and movement in legs  Neurological:      Mental Status: She is alert.     Sensory: No sensory deficit.     Motor: No weakness.  Psychiatric:     Comments: Anxious mood, crying a little at times            Assessment & Plan:    Hip pain: Acute Mostly L hip pain, but right hip hurts as well and lower  back ? Hip issue vs lumbar spine issue Sees sports medicine - Dr Paulla Fore in a few days - wants relief of pain I do not want to start her on any narcotics - she can continue tylenol as needed or otc nsaids for now Tramadol 60 mg IM x 1, Depo-medrol 80 mg IM x 1 today   This visit occurred during the SARS-CoV-2 public health emergency.  Safety protocols were in place, including screening questions prior to the visit, additional usage of staff PPE, and extensive cleaning of exam room while observing appropriate contact time as indicated for disinfecting solutions.

## 2020-08-04 ENCOUNTER — Ambulatory Visit (INDEPENDENT_AMBULATORY_CARE_PROVIDER_SITE_OTHER): Payer: BC Managed Care – PPO | Admitting: Internal Medicine

## 2020-08-04 ENCOUNTER — Encounter: Payer: Self-pay | Admitting: Internal Medicine

## 2020-08-04 ENCOUNTER — Other Ambulatory Visit: Payer: Self-pay

## 2020-08-04 VITALS — BP 106/74 | HR 80 | Temp 98.5°F | Ht 71.0 in | Wt 167.0 lb

## 2020-08-04 DIAGNOSIS — M25559 Pain in unspecified hip: Secondary | ICD-10-CM

## 2020-08-04 MED ORDER — KETOROLAC TROMETHAMINE 60 MG/2ML IM SOLN
60.0000 mg | Freq: Once | INTRAMUSCULAR | Status: AC
  Administered 2020-08-04: 60 mg via INTRAMUSCULAR

## 2020-08-04 MED ORDER — METHYLPREDNISOLONE ACETATE 80 MG/ML IJ SUSP
80.0000 mg | Freq: Once | INTRAMUSCULAR | Status: AC
  Administered 2020-08-04: 80 mg via INTRAMUSCULAR

## 2020-08-04 NOTE — Patient Instructions (Signed)
You received two injections today for your lower back, hip pain.    See Dr Paulla Fore on Wednesday.

## 2020-08-06 DIAGNOSIS — M25559 Pain in unspecified hip: Secondary | ICD-10-CM | POA: Insufficient documentation

## 2020-08-08 DIAGNOSIS — F1020 Alcohol dependence, uncomplicated: Secondary | ICD-10-CM | POA: Diagnosis not present

## 2020-08-08 DIAGNOSIS — F319 Bipolar disorder, unspecified: Secondary | ICD-10-CM | POA: Diagnosis not present

## 2020-08-09 DIAGNOSIS — M545 Low back pain, unspecified: Secondary | ICD-10-CM | POA: Diagnosis not present

## 2020-08-09 DIAGNOSIS — M25452 Effusion, left hip: Secondary | ICD-10-CM | POA: Diagnosis not present

## 2020-08-09 DIAGNOSIS — M25552 Pain in left hip: Secondary | ICD-10-CM | POA: Diagnosis not present

## 2020-08-15 DIAGNOSIS — F1020 Alcohol dependence, uncomplicated: Secondary | ICD-10-CM | POA: Diagnosis not present

## 2020-08-15 DIAGNOSIS — F319 Bipolar disorder, unspecified: Secondary | ICD-10-CM | POA: Diagnosis not present

## 2020-08-23 DIAGNOSIS — F319 Bipolar disorder, unspecified: Secondary | ICD-10-CM | POA: Diagnosis not present

## 2020-08-23 DIAGNOSIS — F1020 Alcohol dependence, uncomplicated: Secondary | ICD-10-CM | POA: Diagnosis not present

## 2020-08-30 DIAGNOSIS — F19120 Other psychoactive substance abuse with intoxication, uncomplicated: Secondary | ICD-10-CM | POA: Diagnosis not present

## 2020-08-30 DIAGNOSIS — F331 Major depressive disorder, recurrent, moderate: Secondary | ICD-10-CM | POA: Diagnosis not present

## 2020-09-05 DIAGNOSIS — F319 Bipolar disorder, unspecified: Secondary | ICD-10-CM | POA: Diagnosis not present

## 2020-09-05 DIAGNOSIS — F1020 Alcohol dependence, uncomplicated: Secondary | ICD-10-CM | POA: Diagnosis not present

## 2020-09-05 DIAGNOSIS — F1021 Alcohol dependence, in remission: Secondary | ICD-10-CM | POA: Diagnosis not present

## 2020-09-12 DIAGNOSIS — M9902 Segmental and somatic dysfunction of thoracic region: Secondary | ICD-10-CM | POA: Diagnosis not present

## 2020-09-12 DIAGNOSIS — M9905 Segmental and somatic dysfunction of pelvic region: Secondary | ICD-10-CM | POA: Diagnosis not present

## 2020-09-12 DIAGNOSIS — F1021 Alcohol dependence, in remission: Secondary | ICD-10-CM | POA: Diagnosis not present

## 2020-09-12 DIAGNOSIS — M9903 Segmental and somatic dysfunction of lumbar region: Secondary | ICD-10-CM | POA: Diagnosis not present

## 2020-09-12 DIAGNOSIS — F319 Bipolar disorder, unspecified: Secondary | ICD-10-CM | POA: Diagnosis not present

## 2020-09-12 DIAGNOSIS — F1020 Alcohol dependence, uncomplicated: Secondary | ICD-10-CM | POA: Diagnosis not present

## 2020-09-12 DIAGNOSIS — M62830 Muscle spasm of back: Secondary | ICD-10-CM | POA: Diagnosis not present

## 2020-09-19 DIAGNOSIS — F319 Bipolar disorder, unspecified: Secondary | ICD-10-CM | POA: Diagnosis not present

## 2020-09-19 DIAGNOSIS — F1020 Alcohol dependence, uncomplicated: Secondary | ICD-10-CM | POA: Diagnosis not present

## 2020-09-26 DIAGNOSIS — F1020 Alcohol dependence, uncomplicated: Secondary | ICD-10-CM | POA: Diagnosis not present

## 2020-09-26 DIAGNOSIS — F319 Bipolar disorder, unspecified: Secondary | ICD-10-CM | POA: Diagnosis not present

## 2020-09-28 DIAGNOSIS — N9489 Other specified conditions associated with female genital organs and menstrual cycle: Secondary | ICD-10-CM | POA: Diagnosis not present

## 2020-09-28 DIAGNOSIS — L723 Sebaceous cyst: Secondary | ICD-10-CM | POA: Diagnosis not present

## 2020-10-03 DIAGNOSIS — F1020 Alcohol dependence, uncomplicated: Secondary | ICD-10-CM | POA: Diagnosis not present

## 2020-10-03 DIAGNOSIS — F319 Bipolar disorder, unspecified: Secondary | ICD-10-CM | POA: Diagnosis not present

## 2020-10-18 DIAGNOSIS — Z20828 Contact with and (suspected) exposure to other viral communicable diseases: Secondary | ICD-10-CM | POA: Diagnosis not present

## 2020-10-30 DIAGNOSIS — F319 Bipolar disorder, unspecified: Secondary | ICD-10-CM | POA: Diagnosis not present

## 2020-10-30 DIAGNOSIS — F1020 Alcohol dependence, uncomplicated: Secondary | ICD-10-CM | POA: Diagnosis not present

## 2020-10-31 DIAGNOSIS — Z20822 Contact with and (suspected) exposure to covid-19: Secondary | ICD-10-CM | POA: Diagnosis not present

## 2020-11-01 DIAGNOSIS — F331 Major depressive disorder, recurrent, moderate: Secondary | ICD-10-CM | POA: Diagnosis not present

## 2020-11-01 DIAGNOSIS — F19120 Other psychoactive substance abuse with intoxication, uncomplicated: Secondary | ICD-10-CM | POA: Diagnosis not present

## 2020-11-07 DIAGNOSIS — F1020 Alcohol dependence, uncomplicated: Secondary | ICD-10-CM | POA: Diagnosis not present

## 2020-11-07 DIAGNOSIS — F319 Bipolar disorder, unspecified: Secondary | ICD-10-CM | POA: Diagnosis not present

## 2020-11-08 ENCOUNTER — Encounter: Payer: Self-pay | Admitting: Internal Medicine

## 2020-11-08 ENCOUNTER — Telehealth: Payer: BC Managed Care – PPO | Admitting: Physician Assistant

## 2020-11-08 DIAGNOSIS — K1379 Other lesions of oral mucosa: Secondary | ICD-10-CM

## 2020-11-08 NOTE — Progress Notes (Signed)
Patient meant to cancel --- needed referral to oral surgery form her PCP who is taking care of things presently.

## 2020-11-08 NOTE — Telephone Encounter (Signed)
Referral done  Please contat pt as she mentioned maybe wanting ROcV before october

## 2020-11-08 NOTE — Telephone Encounter (Signed)
Called pt to clarify her needs & PCP response.  Pt informed that MD placed referral for oral surgeon & that she may come in office to address her concerns.  Pt appreciative; appt made for Fri July 1

## 2020-11-10 ENCOUNTER — Ambulatory Visit: Payer: BC Managed Care – PPO | Admitting: Internal Medicine

## 2020-11-10 DIAGNOSIS — Z20828 Contact with and (suspected) exposure to other viral communicable diseases: Secondary | ICD-10-CM | POA: Diagnosis not present

## 2020-11-14 DIAGNOSIS — F1020 Alcohol dependence, uncomplicated: Secondary | ICD-10-CM | POA: Diagnosis not present

## 2020-11-14 DIAGNOSIS — F319 Bipolar disorder, unspecified: Secondary | ICD-10-CM | POA: Diagnosis not present

## 2020-11-15 DIAGNOSIS — Z01419 Encounter for gynecological examination (general) (routine) without abnormal findings: Secondary | ICD-10-CM | POA: Diagnosis not present

## 2020-11-15 DIAGNOSIS — Z6823 Body mass index (BMI) 23.0-23.9, adult: Secondary | ICD-10-CM | POA: Diagnosis not present

## 2020-11-15 DIAGNOSIS — Z113 Encounter for screening for infections with a predominantly sexual mode of transmission: Secondary | ICD-10-CM | POA: Diagnosis not present

## 2020-11-15 DIAGNOSIS — R87810 Cervical high risk human papillomavirus (HPV) DNA test positive: Secondary | ICD-10-CM | POA: Diagnosis not present

## 2020-11-15 DIAGNOSIS — Z1159 Encounter for screening for other viral diseases: Secondary | ICD-10-CM | POA: Diagnosis not present

## 2020-11-15 DIAGNOSIS — Z1231 Encounter for screening mammogram for malignant neoplasm of breast: Secondary | ICD-10-CM | POA: Diagnosis not present

## 2020-11-15 DIAGNOSIS — Z114 Encounter for screening for human immunodeficiency virus [HIV]: Secondary | ICD-10-CM | POA: Diagnosis not present

## 2020-11-15 DIAGNOSIS — Z118 Encounter for screening for other infectious and parasitic diseases: Secondary | ICD-10-CM | POA: Diagnosis not present

## 2020-11-15 DIAGNOSIS — B977 Papillomavirus as the cause of diseases classified elsewhere: Secondary | ICD-10-CM | POA: Diagnosis not present

## 2020-11-17 ENCOUNTER — Telehealth: Payer: BC Managed Care – PPO | Admitting: Family Medicine

## 2020-11-17 DIAGNOSIS — R059 Cough, unspecified: Secondary | ICD-10-CM | POA: Diagnosis not present

## 2020-11-17 MED ORDER — BENZONATATE 100 MG PO CAPS: 100.0000 mg | ORAL_CAPSULE | Freq: Two times a day (BID) | ORAL | 0 refills | Status: DC | PRN

## 2020-11-17 NOTE — Progress Notes (Signed)
[name], [place] are scheduled for a virtual visit with your provider today.    Just as we do with appointments in the office, we must obtain your consent to participate.  Your consent will be active for this visit and any virtual visit you may have with one of our providers in the next 365 days.    If you have a MyChart account, I can also send a copy of this consent to you electronically.  All virtual visits are billed to your insurance company just like a traditional visit in the office.  As this is a virtual visit, video technology does not allow for your provider to perform a traditional examination.  This may limit your provider's ability to fully assess your condition.  If your provider identifies any concerns that need to be evaluated in person or the need to arrange testing such as labs, EKG, etc, we will make arrangements to do so.    Although advances in technology are sophisticated, we cannot ensure that it will always work on either your end or our end.  If the connection with a video visit is poor, we may have to switch to a telephone visit.  With either a video or telephone visit, we are not always able to ensure that we have a secure connection.   I need to obtain your verbal consent now.   Are you willing to proceed with your visit today?   Amanda Murray has provided verbal consent on 11/17/2020 for a virtual visit (video or telephone). We are sorry that you are not feeling well.  Here is how we plan to help!  Based on your presentation I believe you most likely have A cough due to a virus.  This is called viral bronchitis and is best treated by rest, plenty of fluids and control of the cough.  You may use Ibuprofen or Tylenol as directed to help your symptoms.     In addition you may use A prescription cough medication called Tessalon Perles 100mg . You may take 1-2 capsules every 8 hours as needed for your cough.  From your responses in the eVisit questionnaire you describe inflammation in  the upper respiratory tract which is causing a significant cough.  This is commonly called Bronchitis and has four common causes:   Allergies Viral Infections Acid Reflux Bacterial Infection Allergies, viruses and acid reflux are treated by controlling symptoms or eliminating the cause. An example might be a cough caused by taking certain blood pressure medications. You stop the cough by changing the medication. Another example might be a cough caused by acid reflux. Controlling the reflux helps control the cough.  USE OF BRONCHODILATOR ("RESCUE") INHALERS: There is a risk from using your bronchodilator too frequently.  The risk is that over-reliance on a medication which only relaxes the muscles surrounding the breathing tubes can reduce the effectiveness of medications prescribed to reduce swelling and congestion of the tubes themselves.  Although you feel brief relief from the bronchodilator inhaler, your asthma may actually be worsening with the tubes becoming more swollen and filled with mucus.  This can delay other crucial treatments, such as oral steroid medications. If you need to use a bronchodilator inhaler daily, several times per day, you should discuss this with your provider.  There are probably better treatments that could be used to keep your asthma under control.     HOME CARE Only take medications as instructed by your medical team. Complete the entire course of an antibiotic.  Drink plenty of fluids and get plenty of rest. Avoid close contacts especially the very young and the elderly Cover your mouth if you cough or cough into your sleeve. Always remember to wash your hands A steam or ultrasonic humidifier can help congestion.   GET HELP RIGHT AWAY IF: You develop worsening fever. You become short of breath You cough up blood. Your symptoms persist after you have completed your treatment plan MAKE SURE YOU  Understand these instructions. Will watch your condition. Will  get help right away if you are not doing well or get worse.    Thank you for choosing an e-visit.  Your e-visit answers were reviewed by a board certified advanced clinical practitioner to complete your personal care plan. Depending upon the condition, your plan could have included both over the counter or prescription medications.  Please review your pharmacy choice. Make sure the pharmacy is open so you can pick up prescription now. If there is a problem, you may contact your provider through [employer] and have the prescription routed to another pharmacy.  Your safety is important to [place]. If you have drug allergies check your prescription carefully.   For the next 24 hours you can use MyChart to ask questions about today's visit, request a non-urgent call back, or ask for a work or school excuse. You will get an email in the next two days asking about your experience. I hope that your e-visit has been valuable and will speed your recovery.  I provided 10 minutes of non face-to-face time during this encounter for chart review and documentation.     Perlie Mayo, NP 11/17/2020  2:22 PM

## 2020-11-17 NOTE — Addendum Note (Signed)
Addended by: Perlie Mayo on: 11/17/2020 02:27 PM   Modules accepted: Orders

## 2020-11-20 DIAGNOSIS — K1329 Other disturbances of oral epithelium, including tongue: Secondary | ICD-10-CM | POA: Diagnosis not present

## 2020-11-21 ENCOUNTER — Other Ambulatory Visit: Payer: Self-pay

## 2020-11-22 ENCOUNTER — Encounter: Payer: Self-pay | Admitting: Internal Medicine

## 2020-11-22 ENCOUNTER — Telehealth (INDEPENDENT_AMBULATORY_CARE_PROVIDER_SITE_OTHER): Payer: BC Managed Care – PPO | Admitting: Internal Medicine

## 2020-11-22 DIAGNOSIS — K921 Melena: Secondary | ICD-10-CM | POA: Diagnosis not present

## 2020-11-22 DIAGNOSIS — M79641 Pain in right hand: Secondary | ICD-10-CM

## 2020-11-22 DIAGNOSIS — K136 Irritative hyperplasia of oral mucosa: Secondary | ICD-10-CM | POA: Diagnosis not present

## 2020-11-22 DIAGNOSIS — K6289 Other specified diseases of anus and rectum: Secondary | ICD-10-CM | POA: Diagnosis not present

## 2020-11-22 DIAGNOSIS — M79642 Pain in left hand: Secondary | ICD-10-CM

## 2020-11-22 DIAGNOSIS — R5383 Other fatigue: Secondary | ICD-10-CM

## 2020-11-22 DIAGNOSIS — E559 Vitamin D deficiency, unspecified: Secondary | ICD-10-CM

## 2020-11-22 DIAGNOSIS — R1084 Generalized abdominal pain: Secondary | ICD-10-CM | POA: Diagnosis not present

## 2020-11-22 DIAGNOSIS — E538 Deficiency of other specified B group vitamins: Secondary | ICD-10-CM

## 2020-11-22 DIAGNOSIS — J3489 Other specified disorders of nose and nasal sinuses: Secondary | ICD-10-CM | POA: Insufficient documentation

## 2020-11-22 DIAGNOSIS — K1321 Leukoplakia of oral mucosa, including tongue: Secondary | ICD-10-CM | POA: Diagnosis not present

## 2020-11-22 MED ORDER — DOXYCYCLINE HYCLATE 100 MG PO TABS: 100.0000 mg | ORAL_TABLET | Freq: Two times a day (BID) | ORAL | 0 refills | Status: DC

## 2020-11-22 MED ORDER — MUPIROCIN CALCIUM 2 % NA OINT: 1.0000 "application " | TOPICAL_OINTMENT | Freq: Two times a day (BID) | NASAL | 0 refills | Status: DC

## 2020-11-22 NOTE — Assessment & Plan Note (Signed)
Etiology unclaer, for sport med referral and r/o RA and lupus with labs

## 2020-11-22 NOTE — Assessment & Plan Note (Signed)
Also for mupirocin ointment asd,  to f/u any worsening symptoms or concerns

## 2020-11-22 NOTE — Assessment & Plan Note (Signed)
?   IBS, Etiology unclear, for cbc with labs and GI referral

## 2020-11-22 NOTE — Assessment & Plan Note (Signed)
Minor, for cbc with labs and  to f/u any worsening symptoms or concerns, GI referral

## 2020-11-22 NOTE — Patient Instructions (Signed)
Please take all new medication as prescribed - the pill and ointment antibiotics  Please continue all other medications as before, and refills have been done if requested.  Please have the pharmacy call with any other refills you may need.  Please keep your appointments with your specialists as you may have planned  You will be contacted regarding the referral for: ENT, Gastroenterology, and Sports Medicine  Please go to the XRAY Department in the first floor for the x-ray testing  Please go to the LAB at the blood drawing area for the tests to be done  You will be contacted by phone if any changes need to be made immediately.  Otherwise, you will receive a letter about your results with an explanation, but please check with MyChart first.  Please remember to sign up for MyChart if you have not done so, as this will be important to you in the future with finding out test results, communicating by private email, and scheduling acute appointments online when needed.

## 2020-11-22 NOTE — Assessment & Plan Note (Signed)
Etiology unclear, for doxy course, sinus CT, and refer ENT
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
## 2020-11-22 NOTE — Assessment & Plan Note (Signed)
Etiology unclear, for cbc with labs and GI referral

## 2020-11-22 NOTE — Assessment & Plan Note (Signed)
Etiology unclear, may have psychiatric component but cant r/o physical as well, tomorrow for labs as ordered and cxr

## 2020-11-23 ENCOUNTER — Other Ambulatory Visit: Payer: Self-pay

## 2020-11-23 ENCOUNTER — Encounter: Payer: Self-pay | Admitting: Internal Medicine

## 2020-11-23 ENCOUNTER — Other Ambulatory Visit (INDEPENDENT_AMBULATORY_CARE_PROVIDER_SITE_OTHER): Payer: BC Managed Care – PPO

## 2020-11-23 ENCOUNTER — Ambulatory Visit (INDEPENDENT_AMBULATORY_CARE_PROVIDER_SITE_OTHER)
Admission: RE | Admit: 2020-11-23 | Discharge: 2020-11-23 | Disposition: A | Discharge status: Home / Self Care | Payer: BC Managed Care – PPO | Source: Ambulatory Visit | Attending: Internal Medicine | Admitting: Internal Medicine

## 2020-11-23 ENCOUNTER — Other Ambulatory Visit: Payer: Self-pay | Admitting: Internal Medicine

## 2020-11-23 DIAGNOSIS — R5383 Other fatigue: Secondary | ICD-10-CM | POA: Diagnosis not present

## 2020-11-23 DIAGNOSIS — E538 Deficiency of other specified B group vitamins: Secondary | ICD-10-CM

## 2020-11-23 DIAGNOSIS — R0602 Shortness of breath: Secondary | ICD-10-CM | POA: Diagnosis not present

## 2020-11-23 DIAGNOSIS — M79642 Pain in left hand: Secondary | ICD-10-CM

## 2020-11-23 DIAGNOSIS — M79641 Pain in right hand: Secondary | ICD-10-CM | POA: Diagnosis not present

## 2020-11-23 DIAGNOSIS — E559 Vitamin D deficiency, unspecified: Secondary | ICD-10-CM

## 2020-11-23 DIAGNOSIS — R059 Cough, unspecified: Secondary | ICD-10-CM | POA: Diagnosis not present

## 2020-11-23 LAB — URINALYSIS, ROUTINE W REFLEX MICROSCOPIC
Bilirubin Urine: NEGATIVE
Hgb urine dipstick: NEGATIVE
Ketones, ur: NEGATIVE
Leukocytes,Ua: NEGATIVE
Nitrite: NEGATIVE
RBC / HPF: NONE SEEN (ref 0–?)
Specific Gravity, Urine: <=1.005 — AB (ref 1.000–1.030)
Urine Glucose: NEGATIVE
Urobilinogen, UA: 0.2 (ref 0.0–1.0)
pH: 7 (ref 5.0–8.0)

## 2020-11-23 LAB — CBC WITH DIFFERENTIAL/PLATELET
Basophils Absolute: 0.1 10*3/uL (ref 0.0–0.1)
Basophils Relative: 1.5 % (ref 0.0–3.0)
Eosinophils Absolute: 0.1 10*3/uL (ref 0.0–0.7)
Eosinophils Relative: 1.5 % (ref 0.0–5.0)
HCT: 32 % — ABNORMAL LOW (ref 36.0–46.0)
Hemoglobin: 10.1 g/dL — ABNORMAL LOW (ref 12.0–15.0)
Lymphocytes Relative: 22.8 % (ref 12.0–46.0)
Lymphs Abs: 1.5 10*3/uL (ref 0.7–4.0)
MCHC: 31.5 g/dL (ref 30.0–36.0)
MCV: 65.7 fl — ABNORMAL LOW (ref 78.0–100.0)
Monocytes Absolute: 0.6 10*3/uL (ref 0.1–1.0)
Monocytes Relative: 9.6 % (ref 3.0–12.0)
Neutro Abs: 4.2 10*3/uL (ref 1.4–7.7)
Neutrophils Relative %: 64.6 % (ref 43.0–77.0)
Platelets: 286 10*3/uL (ref 150.0–400.0)
RBC: 4.86 Mil/uL (ref 3.87–5.11)
RDW: 17.9 % — ABNORMAL HIGH (ref 11.5–15.5)
WBC: 6.6 10*3/uL (ref 4.0–10.5)

## 2020-11-23 LAB — BASIC METABOLIC PANEL
BUN: 8 mg/dL (ref 6–23)
CO2: 24 mEq/L (ref 19–32)
Calcium: 8.8 mg/dL (ref 8.4–10.5)
Chloride: 107 mEq/L (ref 96–112)
Creatinine, Ser: 0.62 mg/dL (ref 0.40–1.20)
GFR: 108.23 mL/min (ref >60.00)
Glucose, Bld: 104 mg/dL — ABNORMAL HIGH (ref 70–99)
Potassium: 3.8 mEq/L (ref 3.5–5.1)
Sodium: 139 mEq/L (ref 135–145)

## 2020-11-23 LAB — VITAMIN D 25 HYDROXY (VIT D DEFICIENCY, FRACTURES): VITD: 22.96 ng/mL — ABNORMAL LOW (ref 30.00–100.00)

## 2020-11-23 LAB — HEPATIC FUNCTION PANEL
ALT: 21 U/L (ref 0–35)
AST: 18 U/L (ref 0–37)
Albumin: 4.4 g/dL (ref 3.5–5.2)
Alkaline Phosphatase: 79 U/L (ref 39–117)
Bilirubin, Direct: 0.1 mg/dL (ref 0.0–0.3)
Total Bilirubin: 0.3 mg/dL (ref 0.2–1.2)
Total Protein: 7.2 g/dL (ref 6.0–8.3)

## 2020-11-23 LAB — VITAMIN B12: Vitamin B-12: 201 pg/mL — ABNORMAL LOW (ref 211–911)

## 2020-11-23 LAB — SEDIMENTATION RATE: Sed Rate: 39 mm/hr — ABNORMAL HIGH (ref 0–20)

## 2020-11-23 LAB — C-REACTIVE PROTEIN: CRP: <1 mg/dL (ref 0.5–20.0)

## 2020-11-23 LAB — TSH: TSH: 1.21 u[IU]/mL (ref 0.35–5.50)

## 2020-11-23 MED ORDER — MUPIROCIN CALCIUM 2 % NA OINT: 1.0000 "application " | TOPICAL_OINTMENT | Freq: Two times a day (BID) | NASAL | 0 refills | Status: DC

## 2020-11-23 NOTE — Addendum Note (Signed)
Addended by: Trenda Moots on: 8/88/7579 10:49 AM   Modules accepted: Orders

## 2020-11-23 NOTE — Telephone Encounter (Signed)
    [facility] call and said that they no longer carry  mupirocin nasal ointment (BACTROBAN) 2 %. Please advise

## 2020-11-24 ENCOUNTER — Encounter: Payer: Self-pay | Admitting: Internal Medicine

## 2020-11-24 DIAGNOSIS — F331 Major depressive disorder, recurrent, moderate: Secondary | ICD-10-CM | POA: Diagnosis not present

## 2020-11-24 DIAGNOSIS — F19120 Other psychoactive substance abuse with intoxication, uncomplicated: Secondary | ICD-10-CM | POA: Diagnosis not present

## 2020-11-24 MED ORDER — HYDROCORTISONE (PERIANAL) 2.5 % EX CREA: 1.0000 "application " | TOPICAL_CREAM | Freq: Two times a day (BID) | CUTANEOUS | 0 refills | Status: DC

## 2020-11-24 NOTE — Telephone Encounter (Signed)
Ok for pt to schedule f/u VV

## 2020-11-25 ENCOUNTER — Other Ambulatory Visit: Payer: Self-pay | Admitting: Internal Medicine

## 2020-11-25 ENCOUNTER — Encounter: Payer: Self-pay | Admitting: Internal Medicine

## 2020-11-25 DIAGNOSIS — R911 Solitary pulmonary nodule: Secondary | ICD-10-CM

## 2020-11-26 LAB — CYCLIC CITRUL PEPTIDE ANTIBODY, IGG: Cyclic Citrullin Peptide Ab: <16 UNITS

## 2020-11-26 LAB — ANTI-DNA ANTIBODY, DOUBLE-STRANDED: ds DNA Ab: <1 IU/mL

## 2020-11-26 LAB — ANA: Anti Nuclear Antibody (ANA): NEGATIVE

## 2020-11-27 ENCOUNTER — Telehealth: Payer: Self-pay

## 2020-11-27 ENCOUNTER — Encounter: Payer: Self-pay | Admitting: Internal Medicine

## 2020-11-27 ENCOUNTER — Telehealth: Payer: Self-pay | Admitting: Internal Medicine

## 2020-11-27 DIAGNOSIS — F19120 Other psychoactive substance abuse with intoxication, uncomplicated: Secondary | ICD-10-CM | POA: Diagnosis not present

## 2020-11-27 DIAGNOSIS — F331 Major depressive disorder, recurrent, moderate: Secondary | ICD-10-CM | POA: Diagnosis not present

## 2020-11-27 NOTE — Telephone Encounter (Signed)
[facility] has called stating the patient needs a Pre-Authorization   Please advise

## 2020-11-28 ENCOUNTER — Encounter: Payer: Self-pay | Admitting: Internal Medicine

## 2020-11-28 DIAGNOSIS — F1020 Alcohol dependence, uncomplicated: Secondary | ICD-10-CM | POA: Diagnosis not present

## 2020-11-28 DIAGNOSIS — F319 Bipolar disorder, unspecified: Secondary | ICD-10-CM | POA: Diagnosis not present

## 2020-11-28 NOTE — Telephone Encounter (Signed)
Call never transferred.

## 2020-11-29 ENCOUNTER — Telehealth: Payer: BC Managed Care – PPO | Admitting: Internal Medicine

## 2020-11-29 ENCOUNTER — Other Ambulatory Visit: Payer: Self-pay

## 2020-11-29 ENCOUNTER — Ambulatory Visit
Admission: RE | Admit: 2020-11-29 | Discharge: 2020-11-29 | Disposition: A | Discharge status: Home / Self Care | Payer: BC Managed Care – PPO | Source: Ambulatory Visit | Attending: Internal Medicine | Admitting: Internal Medicine

## 2020-11-29 ENCOUNTER — Encounter: Payer: Self-pay | Admitting: Internal Medicine

## 2020-11-29 ENCOUNTER — Ambulatory Visit: Payer: BC Managed Care – PPO | Admitting: Family Medicine

## 2020-11-29 DIAGNOSIS — E611 Iron deficiency: Secondary | ICD-10-CM

## 2020-11-29 DIAGNOSIS — E559 Vitamin D deficiency, unspecified: Secondary | ICD-10-CM | POA: Diagnosis not present

## 2020-11-29 DIAGNOSIS — J3489 Other specified disorders of nose and nasal sinuses: Secondary | ICD-10-CM

## 2020-11-29 DIAGNOSIS — D3A00 Benign carcinoid tumor of unspecified site: Secondary | ICD-10-CM | POA: Insufficient documentation

## 2020-11-29 DIAGNOSIS — R911 Solitary pulmonary nodule: Secondary | ICD-10-CM

## 2020-11-29 DIAGNOSIS — E538 Deficiency of other specified B group vitamins: Secondary | ICD-10-CM | POA: Diagnosis not present

## 2020-11-29 DIAGNOSIS — R519 Headache, unspecified: Secondary | ICD-10-CM | POA: Diagnosis not present

## 2020-11-29 NOTE — Progress Notes (Deleted)
   Subjective:    I'm seeing this patient as a consultation for:  Dr. Jenny Reichmann. Note will be routed back to referring provider/PCP.  CC: B hand pain  I, Marqual Mi, LAT, ATC, am serving as scribe for Dr. Lynne Leader.  HPI: Pt is a 45 y/o female presenting w/ c/o B hand pain x .  She locates her pain to .  B hand swelling: No Numbness/tingling: Aggravating factors: Treatments tried:  Past medical history, Surgical history, Family history, Social history, Allergies, and medications have been entered into the medical record, reviewed. ***  Review of Systems: No new headache, visual changes, nausea, vomiting, diarrhea, constipation, dizziness, abdominal pain, skin rash, fevers, chills, night sweats, weight loss, swollen lymph nodes, body aches, joint swelling, muscle aches, chest pain, shortness of breath, mood changes, visual or auditory hallucinations.   Objective:   There were no vitals filed for this visit. General: Well Developed, well nourished, and in no acute distress.  Neuro/Psych: Alert and oriented x3, extra-ocular muscles intact, able to move all 4 extremities, sensation grossly intact. Skin: Warm and dry, no rashes noted.  Respiratory: Not using accessory muscles, speaking in full sentences, trachea midline.  Cardiovascular: Pulses palpable, no extremity edema. Abdomen: Does not appear distended. MSK: ***  Lab and Radiology Results No results found for this or any previous visit (from the past 72 hour(s)). No results found.  Impression and Recommendations:    Assessment and Plan: 45 y.o. female with ***.  PDMP not reviewed this encounter. No orders of the defined types were placed in this encounter.  No orders of the defined types were placed in this encounter.   Discussed warning signs or symptoms. Please see discharge instructions. Patient expresses understanding.   ***

## 2020-11-29 NOTE — Telephone Encounter (Signed)
Patient had CT today, 7/20. Closing encounter.

## 2020-11-29 NOTE — Progress Notes (Signed)
Patient ID: Hanne Kegg, female   DOB: January 12, 1976, 45 y.o.   MRN: [id number]  Virtual Visit via Video Note  I connected with Beryle Quant on 11/29/20 at  2:40 PM EDT by a video enabled telemedicine application and verified that I am speaking with the correct person using two identifiers.  Location of all participants today Patient: at home Provider: at office   I discussed the limitations of evaluation and management by telemedicine and the availability of in person appointments. The patient expressed understanding and agreed to proceed.  History of Present Illness: Here to f/u recent lab and CT results.  Pt denies chest pain, increased sob or doe, wheezing, orthopnea, PND, increased LE swelling, palpitations, dizziness or syncope.   Pt denies polydipsia, polyuria, or new focal neuro s/s.  No fever, chills, significant cough. Not taking Vit D or B12.   No other new complaints  CT sinus pending.  Has ENT appt for aug 23.  Also has known uterine fibroids and heavier bleeding recently.   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  reports that she has been smoking. She has a 5.00 pack-year smoking history. She has never used smokeless tobacco. She reports current alcohol use. She reports current drug use. Frequency: 3.00 times per week. Drug: Marijuana. family history includes Alcohol abuse in an other family member; Diabetes in an other family member; Hypertension in an other family member; Mental illness in an other family member; Sudden death in an other family member. No Known Allergies Current Outpatient Medications on File Prior to Visit  Medication Sig Dispense Refill   acetaminophen (TYLENOL) 120 MG suppository Place rectally.     amphetamine-dextroamphetamine (ADDERALL XR) 15 MG 24 hr  capsule Take by mouth daily.     benzonatate (TESSALON) 100 MG capsule Take 1 capsule (100 mg total) by mouth 2 (two) times daily as needed for cough. 20 capsule 0   busPIRone (BUSPAR) 10 MG tablet Take 10 mg by mouth 3 (three) times daily.     cyclobenzaprine (FLEXERIL) 5 MG tablet Take 5 mg by mouth 3 (three) times daily as needed.     doxycycline (VIBRA-TABS) 100 MG tablet Take 1 tablet (100 mg total) by mouth 2 (two) times daily. 20 tablet 0   gabapentin (NEURONTIN) 800 MG tablet Take 800 mg by mouth as needed.     hydrocortisone (ANUSOL-HC) 2.5 % rectal cream Place 1 application rectally 2 (two) times daily. 30 g 0   iron polysaccharides (NU-IRON) 150 MG capsule Take 1 capsule (150 mg total) by mouth daily. 90 capsule 1   mupirocin nasal ointment (BACTROBAN) 2 % Place 1 application into the nose 2 (two) times daily. Use one-half of tube in each nostril twice daily for five (5) days. After application, press sides of nose together and gently massage. 10 g 0   naproxen (NAPROSYN) 500 MG tablet Take 500 mg by mouth 2 (two) times daily.     traZODone (DESYREL) 50 MG tablet Take 50 mg by mouth at bedtime.     valACYclovir (VALTREX) 1000 MG tablet Take 1 tablet (1,000 mg total) by mouth 2 (two) times daily. 14 tablet 2   venlafaxine XR (EFFEXOR-XR) 150 MG 24 hr capsule Take 150 mg by mouth  daily.     No current facility-administered medications on file prior to visit.    Observations/Objective: Alert, NAD, appropriate mood and affect, resps normal, cn 2-12 intact, moves all 4s, no visible rash or swelling Lab Results  Component Value Date   WBC 6.6 11/23/2020   HGB 10.1 (L) 11/23/2020   HCT 32.0 (L) 11/23/2020   PLT 286.0 11/23/2020   GLUCOSE 104 (H) 11/23/2020   CHOL 184 03/09/2020   TRIG 202.0 (H) 03/09/2020   HDL 51.30 03/09/2020   LDLDIRECT 109.0 03/09/2020   LDLCALC 100 (H) 02/09/2019   ALT 21 11/23/2020   AST 18 11/23/2020   NA 139 11/23/2020   K 3.8 11/23/2020   CL 107  11/23/2020   CREATININE 0.62 11/23/2020   BUN 8 11/23/2020   CO2 24 11/23/2020   TSH 1.21 11/23/2020   HGBA1C 5.8 03/09/2020   Lab Results  Component Value Date   VITAMINB12 201 (L) 11/23/2020   Last vitamin D Lab Results  Component Value Date   VD25OH 22.96 (L) 11/23/2020   Lab Results  Component Value Date   WBC 6.6 11/23/2020   HGB 10.1 (L) 11/23/2020   HCT 32.0 (L) 11/23/2020   MCV 65.7 Repeated and verified X2. (L) 11/23/2020   PLT 286.0 11/23/2020   CXR 2v - 11/23/2020 IMPRESSION: Possible focal infiltrate or pulmonary nodule in the posterior lower lung field on the lateral view. Chest CT without contrast is recommended for further evaluation.  CT chest no CM - 11/29/2020 IMPRESSION: 1. 1.1 cm left upper lobe nodule, possibly endobronchial. This may be approachable for bronchoscopic evaluation.    Assessment and Plan: See notes  Follow Up Instructions: See notes  I discussed the assessment and treatment plan with the patient. The patient was provided an opportunity to ask questions and all were answered. The patient agreed with the plan and demonstrated an understanding of the instructions.   The patient was advised to call back or seek an in-person evaluation if the symptoms worsen or if the condition fails to improve as anticipated.  Cathlean Cower, MD

## 2020-11-30 ENCOUNTER — Encounter: Payer: Self-pay | Admitting: Internal Medicine

## 2020-12-03 ENCOUNTER — Encounter: Payer: Self-pay | Admitting: Internal Medicine

## 2020-12-03 NOTE — Assessment & Plan Note (Signed)
CT sinus pending, has f/u appt with ENT

## 2020-12-03 NOTE — Assessment & Plan Note (Signed)
With heavy menses, know uterine fibroid and microcytic anemia - for oral iron replacement, f/u GYN

## 2020-12-03 NOTE — Assessment & Plan Note (Signed)
Lab Results  Component Value Date   VITAMINB12 201 (L) 11/23/2020   Low, to start oral replacement - b12 1000 mcg qd

## 2020-12-03 NOTE — Assessment & Plan Note (Signed)
Last vitamin D Lab Results  Component Value Date   VD25OH 22.96 (L) 11/23/2020   Low, to start oral replacement

## 2020-12-03 NOTE — Patient Instructions (Signed)
Please take OTC Vitamin D3 at 2000 units per day  Please take all new medication as prescribed - otc B12 and iron as well  Please continue all other medications as before, and refills have been done if requested.  Please have the pharmacy call with any other refills you may need.  Please keep your appointments with your specialists as you may have planned - ENT, pulmonary and GI

## 2020-12-03 NOTE — Assessment & Plan Note (Signed)
Etiology unclear, for pulm referral for consideration fror bronchoscopic biopsy,  to f/u any worsening symptoms or concerns

## 2020-12-05 DIAGNOSIS — F1020 Alcohol dependence, uncomplicated: Secondary | ICD-10-CM | POA: Diagnosis not present

## 2020-12-05 DIAGNOSIS — F319 Bipolar disorder, unspecified: Secondary | ICD-10-CM | POA: Diagnosis not present

## 2020-12-07 ENCOUNTER — Telehealth: Payer: BC Managed Care – PPO | Admitting: Physician Assistant

## 2020-12-07 DIAGNOSIS — J029 Acute pharyngitis, unspecified: Secondary | ICD-10-CM | POA: Diagnosis not present

## 2020-12-07 NOTE — Progress Notes (Signed)
Virtual Visit Consent   Amanda Murray, you are scheduled for a virtual visit with a Amanda Murray provider today.     Just as with appointments in the office, your consent must be obtained to participate.  Your consent will be active for this visit and any virtual visit you may have with one of our providers in the next 365 days.     If you have a MyChart account, a copy of this consent can be sent to you electronically.  All virtual visits are billed to your insurance company just like a traditional visit in the office.    As this is a virtual visit, video technology does not allow for your provider to perform a traditional examination.  This may limit your provider's ability to fully assess your condition.  If your provider identifies any concerns that need to be evaluated in person or the need to arrange testing (such as labs, EKG, etc.), we will make arrangements to do so.     Although advances in technology are sophisticated, we cannot ensure that it will always work on either your end or our end.  If the connection with a video visit is poor, the visit may have to be switched to a telephone visit.  With either a video or telephone visit, we are not always able to ensure that we have a secure connection.     I need to obtain your verbal consent now.   Are you willing to proceed with your visit today?    Amanda Murray has provided verbal consent on 12/07/2020 for a virtual visit (video or telephone).   Amanda Murray, [place]   Date: 12/07/2020 3:46 PM   Virtual Visit via Video Note   I, Amanda Murray, connected with  Amanda Murray  ([id number], 01/25/1976) on 12/07/20 at  3:30 PM EDT by a video-enabled telemedicine application and verified that I am speaking with the correct person using two identifiers.  Location: Patient: Virtual Visit Location Patient: Home Provider: Virtual Visit Location Provider: Home Office   I discussed the limitations of evaluation and management by  telemedicine and the availability of in person appointments. The patient expressed understanding and agreed to proceed.    History of Present Illness: Amanda Murray is a 45 y.o. who identifies as a female who was assigned female at birth, and is being seen today for sore throat starting this morning. Notes a mild sore throat but "not scratchy" associated with mild tenderness in anterior lymph nodes with no noted lymphadenopathy. Denies fever, chills, cough or URI symptoms. No dysphagia. Denies recent travel or sick contact. Has not taken anything for symptoms.    HPI: HPI  Problems:  Patient Active Problem List   Diagnosis Date Noted   Vitamin D deficiency 11/29/2020   Vitamin B12 deficiency 11/29/2020   Pulmonary nodule 1 cm or greater in diameter 11/29/2020   Sinus pain 11/22/2020   Anal pain 11/22/2020   Hematochezia 11/22/2020   Generalized abdominal pain 11/22/2020   Bilateral hand pain 11/22/2020   Sore in nose 11/22/2020   Fatigue 11/22/2020   Hip pain 08/06/2020   Lymphadenitis 04/16/2020   Odynophagia 04/16/2020   Iron deficiency 03/09/2020   Allergic rhinitis 02/09/2019   Hyperglycemia 02/09/2019   HLD (hyperlipidemia) 02/09/2019   Anemia 02/09/2019   Alcohol use disorder, mild, 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/27/2017   Major depressive disorder, 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   Severe recurrent major depression without psychotic features ([place]) 06/01/2017  Panic attacks 12/18/2016   Left anterior cruciate ligament tear 06/24/2016   Hemarthrosis of left knee 06/17/2016   Possible exposure to STD 10/10/2015   Smoker 10/10/2015   Skin lesion of back 10/10/2015   Preventative health care [id number]   Helicobacter pylori ab+ 09/30/2013   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   Anxiety and depression    Migraines     Allergies: No Known Allergies Medications:  Current Outpatient Medications:    amphetamine-dextroamphetamine (ADDERALL XR) 15 MG 24 hr  capsule, Take by mouth daily., Disp: , Rfl:    benzonatate (TESSALON) 100 MG capsule, Take 1 capsule (100 mg total) by mouth 2 (two) times daily as needed for cough., Disp: 20 capsule, Rfl: 0   busPIRone (BUSPAR) 10 MG tablet, Take 10 mg by mouth 3 (three) times daily., Disp: , Rfl:    clonazePAM (KLONOPIN) 0.5 MG tablet, Take 0.5 mg by mouth daily as needed., Disp: , Rfl:    doxycycline (VIBRA-TABS) 100 MG tablet, Take 1 tablet (100 mg total) by mouth 2 (two) times daily., Disp: 20 tablet, Rfl: 0   gabapentin (NEURONTIN) 800 MG tablet, Take 800 mg by mouth as needed., Disp: , Rfl:    hydrocortisone (ANUSOL-HC) 2.5 % rectal cream, Place 1 application rectally 2 (two) times daily., Disp: 30 g, Rfl: 0   lamoTRIgine (LAMICTAL) 25 MG tablet, Take 25 mg by mouth at bedtime., Disp: , Rfl:    mupirocin nasal ointment (BACTROBAN) 2 %, Place 1 application into the nose 2 (two) times daily. Use one-half of tube in each nostril twice daily for five (5) days. After application, press sides of nose together and gently massage., Disp: 10 g, Rfl: 0   traZODone (DESYREL) 50 MG tablet, Take 50 mg by mouth at bedtime., Disp: , Rfl:    valACYclovir (VALTREX) 1000 MG tablet, Take 1 tablet (1,000 mg total) by mouth 2 (two) times daily., Disp: 14 tablet, Rfl: 2   venlafaxine XR (EFFEXOR-XR) 150 MG 24 hr capsule, Take 150 mg by mouth daily., Disp: , Rfl:   Observations/Objective: Patient is well-developed, well-nourished in no acute distress.  Resting comfortably at home.  Head is normocephalic, atraumatic.  No labored breathing. Speech is clear and coherent with logical content.  Patient is alert and oriented at baseline.  Oropharynx examined via video with use of flashlight to provide proper lighting. Posterior oropharynx is pink and without tonsillar enlargement. Uvula midline. No cobblestoning noted.   Assessment and Plan: 1. Viral pharyngitis Starting today. Mild. No associated symptoms. Recommend COVID test  to be cautious. Supportive measures and OTC medications reviewed. She is to notify us or her PCP if she has COVID + result and/or if symptoms are not easing up with recommendations given  Follow Up Instructions: I discussed the assessment and treatment plan with the patient. The patient was provided an opportunity to ask questions and all were answered. The patient agreed with the plan and demonstrated an understanding of the instructions.  A copy of instructions were sent to the patient via MyChart.  The patient was advised to call back or seek an in-person evaluation if the symptoms worsen or if the condition fails to improve as anticipated.  Time:  I spent 10 minutes with the patient via telehealth technology discussing the above problems/concerns.    Amanda Rio, PA-C

## 2020-12-07 NOTE — Patient Instructions (Addendum)
Beryle Quant, thank you for joining Leeanne Rio, PA-C for today's virtual visit.  While this provider is not your primary care provider (PCP), if your PCP is located in our provider database this encounter information will be shared with them immediately following your visit.  Consent: (Patient) Amanda Murray provided verbal consent for this virtual visit at the beginning of the encounter.  Current Medications:  Current Outpatient Medications:    amphetamine-dextroamphetamine (ADDERALL XR) 15 MG 24 hr capsule, Take by mouth daily., Disp: , Rfl:    benzonatate (TESSALON) 100 MG capsule, Take 1 capsule (100 mg total) by mouth 2 (two) times daily as needed for cough., Disp: 20 capsule, Rfl: 0   busPIRone (BUSPAR) 10 MG tablet, Take 10 mg by mouth 3 (three) times daily., Disp: , Rfl:    doxycycline (VIBRA-TABS) 100 MG tablet, Take 1 tablet (100 mg total) by mouth 2 (two) times daily., Disp: 20 tablet, Rfl: 0   gabapentin (NEURONTIN) 800 MG tablet, Take 800 mg by mouth as needed., Disp: , Rfl:    hydrocortisone (ANUSOL-HC) 2.5 % rectal cream, Place 1 application rectally 2 (two) times daily., Disp: 30 g, Rfl: 0   mupirocin nasal ointment (BACTROBAN) 2 %, Place 1 application into the nose 2 (two) times daily. Use one-half of tube in each nostril twice daily for five (5) days. After application, press sides of nose together and gently massage., Disp: 10 g, Rfl: 0   traZODone (DESYREL) 50 MG tablet, Take 50 mg by mouth at bedtime., Disp: , Rfl:    valACYclovir (VALTREX) 1000 MG tablet, Take 1 tablet (1,000 mg total) by mouth 2 (two) times daily., Disp: 14 tablet, Rfl: 2   venlafaxine XR (EFFEXOR-XR) 150 MG 24 hr capsule, Take 150 mg by mouth daily., Disp: , Rfl:    Medications ordered in this encounter:  No orders of the defined types were placed in this encounter.    *If you need refills on other medications prior to your next appointment, please contact your pharmacy*  Follow-Up: Call back  or seek an in-person evaluation if the symptoms worsen or if the condition fails to improve as anticipated.  Other Instructions  Make sure to take a COVID test as directed to be cautious.   Your symptoms indicate a likely viral infection (Pharyngitis).   Pharyngitis is inflammation in the back of the throat which can cause a sore throat, scratchiness and sometimes difficulty swallowing.   Pharyngitis is typically caused by a respiratory virus and will just run its course.  Please keep in mind that your symptoms could last up to 10 days.  For throat pain, we recommend over the counter oral pain relief medications such as acetaminophen or aspirin, or anti-inflammatory medications such as ibuprofen or naproxen sodium.  Topical treatments such as oral throat lozenges or sprays may be used as needed.  Avoid close contact with loved ones, especially the very young and elderly.  Remember to wash your hands thoroughly throughout the day as this is the number one way to prevent the spread of infection and wipe down door knobs and counters with disinfectant.  After careful review of your answers, I would not recommend and antibiotic for your condition.  Antibiotics should not be used to treat conditions that we suspect are caused by viruses like the virus that causes the common cold or flu. However, some people can have Strep with atypical symptoms. You may need formal testing in clinic or office to confirm if your symptoms  continue or worsen.  Providers prescribe antibiotics to treat infections caused by bacteria. Antibiotics are very powerful in treating bacterial infections when they are used properly.  To maintain their effectiveness, they should be used only when necessary.  Overuse of antibiotics has resulted in the development of super bugs that are resistant to treatment!    Home Care: Only take medications as instructed by your medical team. Do not drink alcohol while taking these medications. A steam  or ultrasonic humidifier can help congestion.  You can place a towel over your head and breathe in the steam from hot water coming from a faucet. Avoid close contacts especially the very young and the elderly. Cover your mouth when you cough or sneeze. Always remember to wash your hands.  Get Help Right Away If: You develop worsening fever or throat pain. You develop a severe head ache or visual changes. Your symptoms persist after you have completed your treatment plan.  Make sure you Understand these instructions. Will watch your condition. Will get help right away if you are not doing well or get worse.     If you have been instructed to have an in-person evaluation today at a local Urgent Care facility, please use the link below. It will take you to a list of all of our available [facility], including address, phone number and hours of operation. Please do not delay care.  [facility]  If you or a family member do not have a primary care provider, use the link below to schedule a visit and establish care. When you choose a [facility] primary care physician or advanced practice provider, you gain a long-term partner in health. Find a Primary Care Provider  Learn more about [facility] in-office and virtual care options: [facility]

## 2020-12-08 DIAGNOSIS — F332 Major depressive disorder, recurrent severe without psychotic features: Secondary | ICD-10-CM | POA: Diagnosis not present

## 2020-12-11 ENCOUNTER — Ambulatory Visit: Payer: BC Managed Care – PPO

## 2020-12-12 ENCOUNTER — Telehealth: Payer: Self-pay | Admitting: Emergency Medicine

## 2020-12-12 ENCOUNTER — Other Ambulatory Visit: Payer: Self-pay

## 2020-12-12 ENCOUNTER — Ambulatory Visit: Payer: BC Managed Care – PPO | Admitting: Emergency Medicine

## 2020-12-12 ENCOUNTER — Encounter: Payer: Self-pay | Admitting: Emergency Medicine

## 2020-12-12 ENCOUNTER — Encounter: Payer: Self-pay | Admitting: Internal Medicine

## 2020-12-12 VITALS — BP 114/72 | HR 93 | Temp 98.0°F | Ht 70.0 in | Wt 162.8 lb

## 2020-12-12 DIAGNOSIS — R911 Solitary pulmonary nodule: Secondary | ICD-10-CM

## 2020-12-12 DIAGNOSIS — F17200 Nicotine dependence, unspecified, uncomplicated: Secondary | ICD-10-CM | POA: Diagnosis not present

## 2020-12-12 DIAGNOSIS — R0602 Shortness of breath: Secondary | ICD-10-CM | POA: Diagnosis not present

## 2020-12-12 DIAGNOSIS — F332 Major depressive disorder, recurrent severe without psychotic features: Secondary | ICD-10-CM | POA: Diagnosis not present

## 2020-12-12 DIAGNOSIS — R0600 Dyspnea, unspecified: Secondary | ICD-10-CM | POA: Insufficient documentation

## 2020-12-12 DIAGNOSIS — F319 Bipolar disorder, unspecified: Secondary | ICD-10-CM | POA: Diagnosis not present

## 2020-12-12 DIAGNOSIS — F1020 Alcohol dependence, uncomplicated: Secondary | ICD-10-CM | POA: Diagnosis not present

## 2020-12-12 NOTE — Patient Instructions (Signed)
We will work on setting up robotic assisted bronchoscopy to evaluate your small left upper lobe pulmonary nodule.  This will be done as an outpatient under general anesthesia at [facility] will need a designated driver. We will arrange for pulmonary function testing to evaluate shortness of breath Agree with continuing to work on decreasing your smoking. Follow with Dr. Lamonte Sakai next available with full pulmonary function testing on the same day.

## 2020-12-12 NOTE — Assessment & Plan Note (Signed)
No history of asthma in the past but she does have a 30-pack-year tobacco history which does put her at risk for obstructive lung disease.  We need pulmonary function testing to evaluate further.  We will arrange

## 2020-12-12 NOTE — Progress Notes (Addendum)
Subjective:    Patient ID: Amanda Murray, female    DOB: Jul 16, 1975, 45 y.o.   MRN: [id number]  HPI 45 year old woman with history of tobacco use (~30 pack years), remote H pylori, anxiety/depression, migraines.  She is referred today for evaluation of CT scan of the chest.  She reports that she is having progressive exertional SOB - happens with walking the dog, carrying groceries, has been progressive over a few months. She has to stop to rest. She has cough in the am when she gets up, can also happen through the day, not always associated with exertion. Has been present for over a year. She often feels the need to take a sigh or deep breath. She has heard some wheeze intermittently. She has chronic sore throat.    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   UTI (lower urinary tract infection)      Family History  Problem Relation Age of Onset   Alcohol abuse Other    Mental illness Other    Sudden death Other    Hypertension Other    Diabetes Other      Social History   Socioeconomic History   Marital status: Single    Spouse name: Not on file   Number of children: Not on file   Years of education: college   Highest education level: Not on file  Occupational History   Occupation: Hair Stylist  Tobacco Use   Smoking status: Every Day    Packs/day: 0.50    Years: 10.00    Pack years: 5.00    Types: Cigarettes   Smokeless tobacco: Never   Tobacco comments:    1 pack smoked daily 12/12/20 ARJ  Vaping Use   Vaping Use: Never used  Substance and Sexual Activity   Alcohol use: Yes    Comment: couple glasses 3x week   Drug use: Yes    Frequency: 3.0 times per week    Types: Marijuana     Comment: small amount 3 x week   Sexual activity: Yes    Birth control/protection: None  Other Topics Concern   Not on file  Social History Narrative   Not on file   Social Determinants of Health   Financial Resource Strain: Not on file  Food Insecurity: Not on file  Transportation Needs: Not on file  Physical Activity: Not on file  Stress: Not on file  Social Connections: Not on file  Intimate Partner Violence: Not on file    She does hair / makeup, works at [employer], no other real inhaled exposures.  No other inhaled exposures [place], [place], [place], owned chickens; has been exposed to birds before No [employer]   No Known Allergies   Outpatient Medications Prior to Visit  Medication Sig Dispense Refill   amphetamine-dextroamphetamine (ADDERALL XR) 15 MG 24 hr capsule Take by mouth daily.     busPIRone (BUSPAR) 10 MG tablet Take 10 mg by mouth 3 (three) times daily.     clonazePAM (KLONOPIN) 0.5 MG tablet Take 0.5 mg by mouth daily as needed.     gabapentin (NEURONTIN) 800 MG tablet Take 800  mg by mouth as needed.     hydrocortisone (ANUSOL-HC) 2.5 % rectal cream Place 1 application rectally 2 (two) times daily. 30 g 0   lamoTRIgine (LAMICTAL) 25 MG tablet Take 25 mg by mouth at bedtime.     mupirocin nasal ointment (BACTROBAN) 2 % Place 1 application into the nose 2 (two) times daily. Use one-half of tube in each nostril twice daily for five (5) days. After application, press sides of nose together and gently massage. 10 g 0   traZODone (DESYREL) 50 MG tablet Take 50 mg by mouth at bedtime.     valACYclovir (VALTREX) 1000 MG tablet Take 1 tablet (1,000 mg total) by mouth 2 (two) times daily. 14 tablet 2   venlafaxine XR (EFFEXOR-XR) 150 MG 24 hr capsule Take 150 mg by mouth daily.     benzonatate (TESSALON) 100 MG capsule Take 1 capsule (100 mg total) by mouth 2 (two) times daily as needed for cough. (Patient not taking: Reported on 12/12/2020) 20 capsule 0    doxycycline (VIBRA-TABS) 100 MG tablet Take 1 tablet (100 mg total) by mouth 2 (two) times daily. (Patient not taking: Reported on 12/12/2020) 20 tablet 0   No facility-administered medications prior to visit.          Objective:   Physical Exam Vitals:   12/12/20 1401  BP: 114/72  Pulse: 93  Temp: 98 F (36.7 C)  TempSrc: Oral  SpO2: 98%  Weight: 162 lb 12.8 oz (73.8 kg)  Height: '5\' 10"'$  (1.778 m)   Gen: Pleasant, well-nourished, in no distress,  normal affect  ENT: No lesions,  mouth clear,  oropharynx clear, no postnasal drip  Neck: No JVD, no stridor  Lungs: No use of accessory muscles, no crackles or wheezing on normal respiration, no wheeze on forced expiration  Cardiovascular: RRR, heart sounds normal, no murmur or gallops, no peripheral edema  Musculoskeletal: No deformities, no cyanosis or clubbing  Neuro: alert, awake, non focal  Skin: Warm, no lesions or rash      Assessment & Plan:  Smoker Discussed cessation briefly.  We will need to talk about this more going forward.  Pulmonary nodule 1 cm or greater in diameter We will work on setting up robotic assisted bronchoscopy to evaluate your small left upper lobe pulmonary nodule.  This will be done as an outpatient under general anesthesia at [place] will need a designated driver.  Dyspnea No history of asthma in the past but she does have a 30-pack-year tobacco history which does put her at risk for obstructive lung disease.  We need pulmonary function testing to evaluate further.  We will arrange   Baltazar Apo, MD, PhD 12/12/2020, 5:11 PM [facility] [phone] or if no answer before 7:00PM call [phone] For any issues after 7:00PM please call eLink [phone]

## 2020-12-12 NOTE — H&P (View-Only) (Signed)
Subjective:    Patient ID: Amanda Murray, female    DOB: Jul 26, 1975, 45 y.o.   MRN: [id number]  HPI 45 year old woman with history of tobacco use (~30 pack years), THC use, remote H pylori, anxiety/depression, migraines.  She is referred today for evaluation of CT scan of the chest.  She reports that she is having progressive exertional SOB - happens with walking the dog, carrying groceries, has been progressive over a few months. She has to stop to rest. She has cough in the am when she gets up, can also happen through the day, not always associated with exertion. Has been present for over a year. She often feels the need to take a sigh or deep breath. She has heard some wheeze intermittently. She has chronic sore throat.    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   UTI (lower urinary tract infection)      Family History  Problem Relation Age of Onset   Alcohol abuse Other    Mental illness Other    Sudden death Other    Hypertension Other    Diabetes Other      Social History   Socioeconomic History   Marital status: Single    Spouse name: Not on file   Number of children: Not on file   Years of education: college   Highest education level: Not on file  Occupational History   Occupation: Hair Stylist  Tobacco Use   Smoking status: Every Day    Packs/day: 0.50    Years: 10.00    Pack years: 5.00    Types: Cigarettes   Smokeless tobacco: Never   Tobacco comments:    1 pack smoked daily 12/12/20 ARJ  Vaping Use   Vaping Use: Never used  Substance and Sexual Activity   Alcohol use: Yes    Comment: couple glasses 3x week   Drug use: Yes    Frequency: 3.0 times per week    Types: Marijuana     Comment: small amount 3 x week   Sexual activity: Yes    Birth control/protection: None  Other Topics Concern   Not on file  Social History Narrative   Not on file   Social Determinants of Health   Financial Resource Strain: Not on file  Food Insecurity: Not on file  Transportation Needs: Not on file  Physical Activity: Not on file  Stress: Not on file  Social Connections: Not on file  Intimate Partner Violence: Not on file    She does hair / makeup, works at [employer], no other real inhaled exposures.  No other inhaled exposures [place], [place], [place] G[place] up [place] farm, owned chickens; has been exposed to birds before No [employer]   No Known Allergies   Outpatient Medications Prior to Visit  Medication Sig Dispense Refill   amphetamine-dextroamphetamine (ADDERALL XR) 15 MG 24 hr capsule Take by mouth daily.     busPIRone (BUSPAR) 10 MG tablet Take 10 mg by mouth 3 (three) times daily.     clonazePAM (KLONOPIN) 0.5 MG tablet Take 0.5 mg by mouth daily as needed.     gabapentin (NEURONTIN) 800 MG tablet  Take 800 mg by mouth as needed.     hydrocortisone (ANUSOL-HC) 2.5 % rectal cream Place 1 application rectally 2 (two) times daily. 30 g 0   lamoTRIgine (LAMICTAL) 25 MG tablet Take 25 mg by mouth at bedtime.     mupirocin nasal ointment (BACTROBAN) 2 % Place 1 application into the nose 2 (two) times daily. Use one-half of tube in each nostril twice daily for five (5) days. After application, press sides of nose together and gently massage. 10 g 0   traZODone (DESYREL) 50 MG tablet Take 50 mg by mouth at bedtime.     valACYclovir (VALTREX) 1000 MG tablet Take 1 tablet (1,000 mg total) by mouth 2 (two) times daily. 14 tablet 2   venlafaxine XR (EFFEXOR-XR) 150 MG 24 hr capsule Take 150 mg by mouth daily.     benzonatate (TESSALON) 100 MG capsule Take 1 capsule (100 mg total) by mouth 2 (two) times daily as needed for cough. (Patient not taking: Reported on 12/12/2020) 20 capsule 0    doxycycline (VIBRA-TABS) 100 MG tablet Take 1 tablet (100 mg total) by mouth 2 (two) times daily. (Patient not taking: Reported on 12/12/2020) 20 tablet 0   No facility-administered medications prior to visit.          Objective:   Physical Exam Vitals:   12/12/20 1401  BP: 114/72  Pulse: 93  Temp: 98 F (36.7 C)  TempSrc: Oral  SpO2: 98%  Weight: 162 lb 12.8 oz (73.8 kg)  Height: '5\' 10"'$  (1.778 m)   Gen: Pleasant, well-nourished, in no distress,  normal affect  ENT: No lesions,  mouth clear,  oropharynx clear, no postnasal drip  Neck: No JVD, no stridor  Lungs: No use of accessory muscles, no crackles or wheezing on normal respiration, no wheeze on forced expiration  Cardiovascular: RRR, heart sounds normal, no murmur or gallops, no peripheral edema  Musculoskeletal: No deformities, no cyanosis or clubbing  Neuro: alert, awake, non focal  Skin: Warm, no lesions or rash      Assessment & Plan:  Smoker Discussed cessation briefly.  We will need to talk about this more going forward.  Pulmonary nodule 1 cm or greater in diameter We will work on setting up robotic assisted bronchoscopy to evaluate your small left upper lobe pulmonary nodule.  This will be done as an outpatient under general anesthesia at [place] [facility] will need a designated driver.  Dyspnea No history of asthma in the past but she does have a 30-pack-year tobacco history which does put her at risk for obstructive lung disease.  We need pulmonary function testing to evaluate further.  We will arrange   Baltazar Apo, MD, PhD 12/12/2020, 5:11 PM [facility] [phone] or if no answer before 7:00PM call [phone] For any issues after 7:00PM please call eLink [phone]

## 2020-12-12 NOTE — Assessment & Plan Note (Signed)
Discussed cessation briefly.  We will need to talk about this more going forward.

## 2020-12-12 NOTE — Telephone Encounter (Signed)
Pt informed of the following:  Super D Disk from CT on 7/20 being sent from [place] to [place]. Addressed to [place]. Once rec'd, will interoffice to [facility] END[facility].  Covid test 8/12 between 8am-3pm.  [place] 8/15 at 9:30am; 7:00am arrival time @ [facility].  Case# [id number]

## 2020-12-12 NOTE — Telephone Encounter (Signed)
I dont have anything else to offer since she had doxy course and mupirocin  The next step is to see ENT and I think she has already been referred

## 2020-12-12 NOTE — Assessment & Plan Note (Signed)
We will work on setting up robotic assisted bronchoscopy to evaluate your small left upper lobe pulmonary nodule.  This will be done as an outpatient under general anesthesia at [place] will need a designated driver.

## 2020-12-13 NOTE — Telephone Encounter (Signed)
Hello Dr. Lamonte Sakai, please see mychart message, thanks!  Good Morning Dr. Lamonte Sakai, Thank you for meeting with me yesterday. Last night as my mind was rolling through the day I remembered some pretty significant exposure to pretty bad harsh chemicals before adult hood If there is anyway I can speak  to about this you by phone for 3-5 minutes . I can make it quick but I think you should know . [phone]

## 2020-12-14 DIAGNOSIS — F332 Major depressive disorder, recurrent severe without psychotic features: Secondary | ICD-10-CM | POA: Diagnosis not present

## 2020-12-14 NOTE — Telephone Encounter (Signed)
Super D Disk rec'd and interofficed to [facility] ENDO.

## 2020-12-15 DIAGNOSIS — F332 Major depressive disorder, recurrent severe without psychotic features: Secondary | ICD-10-CM | POA: Diagnosis not present

## 2020-12-16 ENCOUNTER — Ambulatory Visit (HOSPITAL_COMMUNITY)
Admission: RE | Admit: 2020-12-16 | Discharge: 2020-12-16 | Disposition: A | Discharge status: Home / Self Care | Payer: BC Managed Care – PPO | Source: Ambulatory Visit | Attending: Internal Medicine | Admitting: Internal Medicine

## 2020-12-16 ENCOUNTER — Other Ambulatory Visit: Payer: Self-pay

## 2020-12-16 ENCOUNTER — Encounter (HOSPITAL_COMMUNITY): Payer: Self-pay

## 2020-12-16 VITALS — BP 99/62 | HR 103 | Temp 99.6°F | Resp 18

## 2020-12-16 DIAGNOSIS — K120 Recurrent oral aphthae: Secondary | ICD-10-CM | POA: Diagnosis not present

## 2020-12-16 DIAGNOSIS — J029 Acute pharyngitis, unspecified: Secondary | ICD-10-CM | POA: Diagnosis not present

## 2020-12-16 NOTE — Discharge Instructions (Signed)
You can use the nasal spray and ointment previously provided.    Follow up with ENT as scheduled.

## 2020-12-16 NOTE — ED Triage Notes (Signed)
Pt presents today with c/o of continued nasal/throat discomfort for several months. She reports being seen by PCP for same and has upcoming ENT appt.

## 2020-12-16 NOTE — ED Provider Notes (Signed)
[facility]    CSN: [id number] Arrival date & time: 12/16/20  1057      History   Chief Complaint Chief Complaint  Patient presents with   Sore Throat   Facial Pain    otalgia    HPI Amanda Murray is a 45 y.o. female.   Patient here for evaluation of nasal sores and throat discomfort that has been ongoing since June.  Reports being given ointment to use with minimal relief.  Reports using vaseline and coconut oil initially with no relief.  Also reports using nasal saline sprays.  Has been evaluated at PCP and was referred to ENT but appointment is not scheduled for another few weeks.  Reports intermittently painful to swallow but managing secretions.  Denies any trauma, injury, or other precipitating event.  Denies any specific alleviating or aggravating factors.  Denies any fevers, chest pain, shortness of breath, N/V/D, numbness, tingling, weakness, abdominal pain, or headaches.    The history is provided by the patient.  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   UTI (lower urinary tract infection)     Patient Active Problem List   Diagnosis Date Noted   Dyspnea 12/12/2020   Vitamin D deficiency 11/29/2020   Vitamin B12 deficiency 11/29/2020   Pulmonary nodule 1 cm or greater in diameter 11/29/2020   Sinus pain 11/22/2020   Anal pain 11/22/2020   Hematochezia 11/22/2020   Generalized abdominal pain 11/22/2020   Bilateral hand pain 11/22/2020   Sore in nose 11/22/2020   Fatigue 11/22/2020   Hip pain 08/06/2020   Lymphadenitis 04/16/2020   Odynophagia 04/16/2020   Iron deficiency 03/09/2020   Allergic rhinitis 02/09/2019   Hyperglycemia 02/09/2019   HLD (hyperlipidemia) 02/09/2019   Anemia 02/09/2019   Alcohol use disorder, mild, 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/27/2017   Major depressive disorder, 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 No pertinent surgical history.  OB History   No obstetric history on file.      Home Medications    Prior to Admission medications   Medication Sig Start Date End Date Taking? Authorizing Provider  amphetamine-dextroamphetamine (ADDERALL XR) 15 MG 24 hr capsule Take by mouth daily. 08/02/20   [provider]  benzonatate (TESSALON) 100 MG capsule Take 1 capsule (100 mg total) by mouth 2 (two) times daily as needed for cough. Patient not taking: Reported on 12/12/2020 11/17/20   Perlie Mayo, NP  busPIRone (BUSPAR) 10 MG tablet Take 10 mg by mouth 3 (three) times daily. 08/04/20   [provider]  clonazePAM (KLONOPIN) 0.5 MG tablet Take 0.5 mg by mouth daily as needed. 11/27/20   [provider]  doxycycline (VIBRA-TABS) 100 MG tablet Take 1 tablet (100 mg total) by mouth 2 (two) times daily. Patient not taking: No sig reported 11/22/20   Cathlean Cower  W, MD  gabapentin (NEURONTIN) 800 MG tablet Take 800 mg by mouth as needed.    [provider]  hydrocortisone (ANUSOL-HC) 2.5 % rectal cream Place 1 application rectally 2 (two) times daily. 11/24/20   Biagio Borg, MD  lamoTRIgine (LAMICTAL) 25 MG tablet Take 25 mg by mouth at bedtime. 11/27/20   [provider]  mupirocin nasal ointment (BACTROBAN) 2 % Place 1 application into the nose 2 (two) times daily. Use one-half of tube in each nostril twice daily for five (5) days. After application, press  sides of nose together and gently massage. 11/23/20   Biagio Borg, MD  traZODone (DESYREL) 50 MG tablet Take 50 mg by mouth at bedtime.    [provider]  valACYclovir (VALTREX) 1000 MG tablet Take 1 tablet (1,000 mg total) by mouth 2 (two) times daily. 03/09/20   Biagio Borg, MD  venlafaxine XR (EFFEXOR-XR) 150 MG 24 hr capsule Take 150 mg by mouth daily. 06/08/20   [provider]    Family History Family History  Problem Relation Age of Onset   Alcohol abuse Other    Mental illness Other    Sudden death Other    Hypertension Other    Diabetes Other     Social History Social History   Tobacco Use   Smoking status: Every Day    Packs/day: 0.50    Years: 10.00    Pack years: 5.00    Types: Cigarettes   Smokeless tobacco: Never   Tobacco comments:    1 pack smoked daily 12/12/20 ARJ  Vaping Use   Vaping Use: Never used  Substance Use Topics   Alcohol use: Yes    Comment: couple glasses 3x week   Drug use: Yes    Frequency: 3.0 times per week    Types: Marijuana    Comment: small amount 3 x week     Allergies   Patient has no known allergies.   Review of Systems Review of Systems  Constitutional:  Negative for fever.  HENT:  Positive for mouth sores, sore throat and trouble swallowing.   All other systems reviewed and are negative.   Physical Exam Triage Vital Signs ED Triage Vitals  Enc Vitals Group     BP 12/16/20 1128 99/62     Pulse Rate 12/16/20 1128 (!) 103     Resp 12/16/20 1128 18     Temp 12/16/20 1128 99.6 F (37.6 C)     Temp Source 12/16/20 1128 Oral     SpO2 12/16/20 1128 98 %     Weight --      Height --      Head Circumference --      Peak Flow --      Pain Score 12/16/20 1123 5     Pain Loc --      Pain Edu? --      Excl. in [place]? --    No data found.  Updated Vital Signs BP 99/62 (BP Location: Right Arm)   Pulse (!) 103   Temp 99.6 F (37.6 C) (Oral)   Resp 18   LMP 11/26/2020   SpO2 98%   Visual  Acuity Right Eye Distance:   Left Eye Distance:   Bilateral Distance:    Right Eye Near:   Left Eye Near:    Bilateral Near:     Physical Exam Vitals and nursing note reviewed.  Constitutional:      General: She is not  in acute distress.    Appearance: Normal appearance. She is not ill-appearing, toxic-appearing or diaphoretic.  HENT:     Head: Normocephalic and atraumatic.     Right Ear: Tympanic membrane and ear canal normal.     Left Ear: Tympanic membrane and ear canal normal.     Nose: No nasal deformity, signs of injury, nasal tenderness, congestion or rhinorrhea.     Right Turbinates: Enlarged.     Left Turbinates: Enlarged and pale.     Right Sinus: No maxillary sinus tenderness or frontal sinus tenderness.     Left Sinus: No maxillary sinus tenderness or frontal sinus tenderness.     Mouth/Throat:     Mouth: Mucous membranes are moist.     Tongue: Lesions (36m canker sore to right side of tongue) present.     Palate: No lesions.     Pharynx: Oropharynx is clear. Uvula midline. No pharyngeal swelling, oropharyngeal exudate, posterior oropharyngeal erythema or uvula swelling.     Tonsils: No tonsillar exudate or tonsillar abscesses. 0 on the right. 0 on the left.  Eyes:     Conjunctiva/sclera: Conjunctivae normal.  Cardiovascular:     Rate and Rhythm: Normal rate.     Pulses: Normal pulses.  Pulmonary:     Effort: Pulmonary effort is normal.  Chest:  Breasts:    Right: No supraclavicular adenopathy.     Left: No supraclavicular adenopathy.  Abdominal:     General: Abdomen is flat.  Musculoskeletal:        General: Normal range of motion.     Cervical back: Normal range of motion.  Lymphadenopathy:     Head:     Right side of head: No submental, submandibular, tonsillar, preauricular, posterior auricular or occipital adenopathy.     Left side of head: No submental, submandibular, tonsillar, preauricular, posterior auricular or occipital adenopathy.     Cervical:  No cervical adenopathy.     Right cervical: No superficial, deep or posterior cervical adenopathy.    Left cervical: No superficial, deep or posterior cervical adenopathy.     Upper Body:     Right upper body: No supraclavicular adenopathy.     Left upper body: No supraclavicular adenopathy.  Skin:    General: Skin is warm and dry.  Neurological:     General: No focal deficit present.     Mental Status: She is alert and oriented to person, place, and time.  Psychiatric:        Mood and Affect: Mood normal.     UC Treatments / Results  Labs (all labs ordered are listed, but only abnormal results are displayed) Labs Reviewed - No data to display  EKG   Radiology No results found.  Procedures Procedures (including critical care time)  Medications Ordered in UC Medications - No data to display  Initial Impression / Assessment and Plan / UC Course  I have reviewed the triage vital signs and the nursing notes.  Pertinent labs & imaging results that were available during my care of the patient were reviewed by me and considered in my medical decision making (see chart for details).     Assessment negative for red flags or concerns.  Discussed conservative symptom management of oral canker sore.  Recommend continuing to use ointment and nasal spray as prescribed by doctor.  Patient became very agitated that we were not able to perform any specific testing to determine cause of nasal and throat sores despite multiple attempts to calm and  reassure her.  Explained that we are limited in our testing ability and that she needs to follow up with ENT as scheduled.   Final Clinical Impressions(s) / UC Diagnoses   Final diagnoses:  Sore throat  Canker sores oral     Discharge Instructions      You can use the nasal spray and ointment previously provided.    Follow up with ENT as scheduled.      ED Prescriptions   None    PDMP not reviewed this encounter.   Pearson Forster, NP 12/16/20 1228

## 2020-12-18 DIAGNOSIS — F332 Major depressive disorder, recurrent severe without psychotic features: Secondary | ICD-10-CM | POA: Diagnosis not present

## 2020-12-19 ENCOUNTER — Encounter: Payer: Self-pay | Admitting: Internal Medicine

## 2020-12-19 DIAGNOSIS — F332 Major depressive disorder, recurrent severe without psychotic features: Secondary | ICD-10-CM | POA: Diagnosis not present

## 2020-12-19 DIAGNOSIS — F1020 Alcohol dependence, uncomplicated: Secondary | ICD-10-CM | POA: Diagnosis not present

## 2020-12-19 DIAGNOSIS — F319 Bipolar disorder, unspecified: Secondary | ICD-10-CM | POA: Diagnosis not present

## 2020-12-21 DIAGNOSIS — F19120 Other psychoactive substance abuse with intoxication, uncomplicated: Secondary | ICD-10-CM | POA: Diagnosis not present

## 2020-12-21 DIAGNOSIS — F332 Major depressive disorder, recurrent severe without psychotic features: Secondary | ICD-10-CM | POA: Diagnosis not present

## 2020-12-21 DIAGNOSIS — F331 Major depressive disorder, recurrent, moderate: Secondary | ICD-10-CM | POA: Diagnosis not present

## 2020-12-22 ENCOUNTER — Telehealth: Payer: Self-pay | Admitting: Internal Medicine

## 2020-12-22 ENCOUNTER — Other Ambulatory Visit: Payer: Self-pay | Admitting: Emergency Medicine

## 2020-12-22 ENCOUNTER — Telehealth: Payer: Self-pay | Admitting: Emergency Medicine

## 2020-12-22 ENCOUNTER — Encounter (HOSPITAL_COMMUNITY): Payer: Self-pay | Admitting: Emergency Medicine

## 2020-12-22 ENCOUNTER — Other Ambulatory Visit: Payer: Self-pay

## 2020-12-22 LAB — SARS CORONAVIRUS 2 (TAT 6-24 HRS): SARS Coronavirus 2: NEGATIVE

## 2020-12-22 NOTE — Telephone Encounter (Signed)
Discussed details of the procedure, intubation, anesthesia with the patient.  She has some sores in her nose, is concerned about etiology, about whether our procedure will impact these.   She also wanted to update me about inhaled exposures.

## 2020-12-22 NOTE — Telephone Encounter (Signed)
   Amanda Murray DOB: Jul 22, 1975 MRN: [id number]   RIDER WAIVER AND RELEASE OF LIABILITY  For purposes of improving physical access to our facilities, [facility] is pleased to partner with third parties to provide [facility] patients or other authorized individuals the option of convenient, on-demand ground transportation services (the [place]") through use of the technology service that enables users to request on-demand ground transportation from independent third-party providers.  By opting to use and accept these [employer], I, the undersigned, hereby agree on behalf of myself, and on behalf of any minor child using the Government social research officer for whom I am the parent or legal guardian, as follows:  Government social research officer provided to me are provided by independent third-party transportation providers who are not [employer] or employees and who are unaffiliated with [employer]. [facility] is neither a transportation carrier nor a common or public carrier. [facility] has no control over the quality or safety of the transportation that occurs as a result of the [employer]. [facility] cannot guarantee that any third-party transportation provider will complete any arranged transportation service. [facility] makes no representation, warranty, or guarantee regarding the reliability, timeliness, quality, safety, suitability, or availability of any of the Transport Services or that they will be error free. I fully understand that traveling by vehicle involves risks and dangers of serious bodily injury, including permanent disability, paralysis, and death. I agree, on behalf of myself and on behalf of any minor child using the Transport Services for whom I am the parent or legal guardian, that the entire risk arising out of my use of the [employer] remains solely with me, to the maximum extent permitted under applicable law. The [employer] are provided "as is"  and "as available." [facility] disclaims all representations and warranties, express, implied or statutory, not expressly set out in these terms, including the implied warranties of merchantability and fitness for a particular purpose. I hereby waive and release [facility], its agents, employees, officers, directors, representatives, insurers, attorneys, assigns, successors, subsidiaries, and affiliates from any and all past, present, or future claims, demands, liabilities, actions, causes of action, or suits of any kind directly or indirectly arising from acceptance and use of the [employer]. I further waive and release [facility] and its affiliates from all present and future liability and responsibility for any injury or death to persons or damages to property caused by or related to the use of the [employer]. I have read this Waiver and Release of Liability, and I understand the terms used in it and their legal significance. This Waiver is freely and voluntarily given with the understanding that my right (as well as the right of any minor child for whom I am the parent or legal guardian using the [employer]) to legal recourse against [facility] in connection with the [employer] is knowingly surrendered in return for use of these services.   I attest that I read the consent document to Beryle Quant, gave Ms. Heldreth the opportunity to ask questions and answered the questions asked (if any). I affirm that Holiday Nurnberger then provided consent for she's participation in this program.     Darrick Meigs [place]aint

## 2020-12-22 NOTE — Telephone Encounter (Signed)
Patient has questions about Bronch Procedure. Patient phone number is [phone].

## 2020-12-22 NOTE — Progress Notes (Signed)
Spoke with pt for pre-op call. Pt denies cardiac history, HTN or Diabetes. Pt concerned about nasal sores that she has had for a few months. Has been to the ED and was instructed to follow up with ENT.  Covid test done today.

## 2020-12-22 NOTE — Telephone Encounter (Signed)
Sent message to [place] making him aware that pt would like him to call her and per RB, he will call pt tonight.  Called pt to update her on this and she verbalized understanding. Will keep encounter open.

## 2020-12-22 NOTE — Telephone Encounter (Signed)
Called and spoke with pt who has questions about her upcoming bronch procedure which is scheduled 12/25/20.  Pt said that she has sores in her nose and is concerned about where the tube is going to be inserted to do the bronch.  Dr. Lamonte Sakai please advise and if you can call pt to further discuss with her. Thanks!

## 2020-12-24 NOTE — Telephone Encounter (Signed)
I was able to speak with the pt by phone.

## 2020-12-25 ENCOUNTER — Encounter: Payer: Self-pay | Admitting: Internal Medicine

## 2020-12-25 ENCOUNTER — Encounter (HOSPITAL_COMMUNITY)
Admission: RE | Disposition: A | Discharge status: Home / Self Care | Payer: Self-pay | Source: Home / Self Care | Attending: Emergency Medicine

## 2020-12-25 ENCOUNTER — Ambulatory Visit (HOSPITAL_COMMUNITY)
Admission: RE | Admit: 2020-12-25 | Discharge: 2020-12-25 | Disposition: A | Discharge status: Home / Self Care | Payer: BC Managed Care – PPO | Attending: Emergency Medicine | Admitting: Emergency Medicine

## 2020-12-25 ENCOUNTER — Ambulatory Visit (HOSPITAL_COMMUNITY): Payer: BC Managed Care – PPO | Admitting: Vascular Surgery

## 2020-12-25 ENCOUNTER — Encounter (HOSPITAL_COMMUNITY): Payer: Self-pay | Admitting: Emergency Medicine

## 2020-12-25 ENCOUNTER — Other Ambulatory Visit: Payer: Self-pay

## 2020-12-25 ENCOUNTER — Ambulatory Visit (HOSPITAL_COMMUNITY): Payer: BC Managed Care – PPO

## 2020-12-25 DIAGNOSIS — Z9889 Other specified postprocedural states: Secondary | ICD-10-CM

## 2020-12-25 DIAGNOSIS — D3A8 Other benign neuroendocrine tumors: Secondary | ICD-10-CM | POA: Diagnosis not present

## 2020-12-25 DIAGNOSIS — Z122 Encounter for screening for malignant neoplasm of respiratory organs: Secondary | ICD-10-CM | POA: Diagnosis not present

## 2020-12-25 DIAGNOSIS — F17210 Nicotine dependence, cigarettes, uncomplicated: Secondary | ICD-10-CM | POA: Diagnosis not present

## 2020-12-25 DIAGNOSIS — F418 Other specified anxiety disorders: Secondary | ICD-10-CM | POA: Insufficient documentation

## 2020-12-25 DIAGNOSIS — E785 Hyperlipidemia, unspecified: Secondary | ICD-10-CM | POA: Diagnosis not present

## 2020-12-25 DIAGNOSIS — Z79899 Other long term (current) drug therapy: Secondary | ICD-10-CM | POA: Insufficient documentation

## 2020-12-25 DIAGNOSIS — G43909 Migraine, unspecified, not intractable, without status migrainosus: Secondary | ICD-10-CM | POA: Diagnosis not present

## 2020-12-25 DIAGNOSIS — R911 Solitary pulmonary nodule: Secondary | ICD-10-CM | POA: Diagnosis not present

## 2020-12-25 DIAGNOSIS — D3A00 Benign carcinoid tumor of unspecified site: Secondary | ICD-10-CM | POA: Diagnosis present

## 2020-12-25 DIAGNOSIS — K219 Gastro-esophageal reflux disease without esophagitis: Secondary | ICD-10-CM | POA: Diagnosis not present

## 2020-12-25 DIAGNOSIS — Z419 Encounter for procedure for purposes other than remedying health state, unspecified: Secondary | ICD-10-CM

## 2020-12-25 DIAGNOSIS — D3A090 Benign carcinoid tumor of the bronchus and lung: Secondary | ICD-10-CM | POA: Insufficient documentation

## 2020-12-25 HISTORY — DX: Anemia, unspecified: D64.9

## 2020-12-25 HISTORY — PX: VIDEO BRONCHOSCOPY WITH ENDOBRONCHIAL NAVIGATION: SHX6175

## 2020-12-25 HISTORY — DX: Gastro-esophageal reflux disease without esophagitis: K21.9

## 2020-12-25 HISTORY — PX: BRONCHIAL BIOPSY: SHX5109

## 2020-12-25 HISTORY — PX: VIDEO BRONCHOSCOPY WITH RADIAL ENDOBRONCHIAL ULTRASOUND: SHX6849

## 2020-12-25 HISTORY — PX: BRONCHIAL NEEDLE ASPIRATION BIOPSY: SHX5106

## 2020-12-25 LAB — CBC
HCT: 32.1 % — ABNORMAL LOW (ref 36.0–46.0)
Hemoglobin: 9.3 g/dL — ABNORMAL LOW (ref 12.0–15.0)
MCH: 19.7 pg — ABNORMAL LOW (ref 26.0–34.0)
MCHC: 29 g/dL — ABNORMAL LOW (ref 30.0–36.0)
MCV: 67.9 fL — ABNORMAL LOW (ref 80.0–100.0)
Platelets: 311 10*3/uL (ref 150–400)
RBC: 4.73 MIL/uL (ref 3.87–5.11)
RDW: 18.4 % — ABNORMAL HIGH (ref 11.5–15.5)
WBC: 5.6 10*3/uL (ref 4.0–10.5)
nRBC: 0 % (ref 0.0–0.2)

## 2020-12-25 LAB — POCT PREGNANCY, URINE: Preg Test, Ur: NEGATIVE

## 2020-12-25 SURGERY — VIDEO BRONCHOSCOPY WITH ENDOBRONCHIAL NAVIGATION
Anesthesia: General

## 2020-12-25 MED ORDER — MEPERIDINE HCL 100 MG/ML IJ SOLN: 6.2500 mg | INTRAMUSCULAR | Status: DC | PRN

## 2020-12-25 MED ORDER — ONDANSETRON HCL 4 MG/2ML IJ SOLN
INTRAMUSCULAR | Status: DC | PRN
  Administered 2020-12-25: 4 mg via INTRAVENOUS

## 2020-12-25 MED ORDER — PHENYLEPHRINE 40 MCG/ML (10ML) SYRINGE FOR IV PUSH (FOR BLOOD PRESSURE SUPPORT)
PREFILLED_SYRINGE | INTRAVENOUS | Status: DC | PRN
  Administered 2020-12-25 (×3): 40 ug via INTRAVENOUS

## 2020-12-25 MED ORDER — ROCURONIUM BROMIDE 10 MG/ML (PF) SYRINGE
PREFILLED_SYRINGE | INTRAVENOUS | Status: DC | PRN
  Administered 2020-12-25: 50 mg via INTRAVENOUS

## 2020-12-25 MED ORDER — OXYCODONE HCL 5 MG/5ML PO SOLN: 5.0000 mg | Freq: Once | ORAL | Status: DC | PRN

## 2020-12-25 MED ORDER — MIDAZOLAM HCL 5 MG/5ML IJ SOLN
INTRAMUSCULAR | Status: DC | PRN
  Administered 2020-12-25 (×2): 1 mg via INTRAVENOUS

## 2020-12-25 MED ORDER — CHLORHEXIDINE GLUCONATE 0.12 % MT SOLN
OROMUCOSAL | Status: AC
  Filled 2020-12-25: qty 15

## 2020-12-25 MED ORDER — VALACYCLOVIR HCL 1 G PO TABS: 1000.0000 mg | ORAL_TABLET | Freq: Every day | ORAL | Status: DC | PRN

## 2020-12-25 MED ORDER — SUCCINYLCHOLINE CHLORIDE 200 MG/10ML IV SOSY
PREFILLED_SYRINGE | INTRAVENOUS | Status: DC | PRN
  Administered 2020-12-25: 120 mg via INTRAVENOUS

## 2020-12-25 MED ORDER — HYDROMORPHONE HCL 1 MG/ML IJ SOLN: 0.2500 mg | INTRAMUSCULAR | Status: DC | PRN

## 2020-12-25 MED ORDER — FENTANYL CITRATE (PF) 100 MCG/2ML IJ SOLN
INTRAMUSCULAR | Status: DC | PRN
  Administered 2020-12-25 (×2): 50 ug via INTRAVENOUS

## 2020-12-25 MED ORDER — CHLORHEXIDINE GLUCONATE 0.12 % MT SOLN
15.0000 mL | Freq: Once | OROMUCOSAL | Status: AC
  Administered 2020-12-25: 15 mL via OROMUCOSAL

## 2020-12-25 MED ORDER — SUGAMMADEX SODIUM 200 MG/2ML IV SOLN
INTRAVENOUS | Status: DC | PRN
  Administered 2020-12-25 (×2): 100 mg via INTRAVENOUS

## 2020-12-25 MED ORDER — LACTATED RINGERS IV SOLN: INTRAVENOUS | Status: DC

## 2020-12-25 MED ORDER — OXYCODONE HCL 5 MG PO TABS: 5.0000 mg | ORAL_TABLET | Freq: Once | ORAL | Status: DC | PRN

## 2020-12-25 MED ORDER — PROPOFOL 10 MG/ML IV BOLUS
INTRAVENOUS | Status: DC | PRN
  Administered 2020-12-25: 140 mg via INTRAVENOUS

## 2020-12-25 MED ORDER — LIDOCAINE 2% (20 MG/ML) 5 ML SYRINGE
INTRAMUSCULAR | Status: DC | PRN
  Administered 2020-12-25: 80 mg via INTRAVENOUS

## 2020-12-25 MED ORDER — AMISULPRIDE (ANTIEMETIC) 5 MG/2ML IV SOLN: 10.0000 mg | Freq: Once | INTRAVENOUS | Status: DC | PRN

## 2020-12-25 MED ORDER — DEXAMETHASONE SODIUM PHOSPHATE 10 MG/ML IJ SOLN
INTRAMUSCULAR | Status: DC | PRN
  Administered 2020-12-25: 10 mg via INTRAVENOUS

## 2020-12-25 MED ORDER — LACTATED RINGERS IV SOLN: INTRAVENOUS | Status: DC | PRN

## 2020-12-25 MED ORDER — PHENYLEPHRINE HCL-NACL 20-0.9 MG/250ML-% IV SOLN
INTRAVENOUS | Status: DC | PRN
  Administered 2020-12-25: 20 ug/min via INTRAVENOUS

## 2020-12-25 MED ORDER — PROMETHAZINE HCL 25 MG/ML IJ SOLN: 6.2500 mg | INTRAMUSCULAR | Status: DC | PRN

## 2020-12-25 MED ORDER — EPHEDRINE SULFATE-NACL 50-0.9 MG/10ML-% IV SOSY
PREFILLED_SYRINGE | INTRAVENOUS | Status: DC | PRN
  Administered 2020-12-25: 5 mg via INTRAVENOUS
  Administered 2020-12-25: 10 mg via INTRAVENOUS
  Administered 2020-12-25: 5 mg via INTRAVENOUS

## 2020-12-25 NOTE — Anesthesia Procedure Notes (Signed)
Procedure Name: Intubation Date/Time: 12/25/2020 9:30 AM Performed by: Janene Harvey, CRNA Pre-anesthesia Checklist: Patient identified, Emergency Drugs available, Suction available and Patient being monitored Patient Re-evaluated:Patient Re-evaluated prior to induction Oxygen Delivery Method: Circle system utilized Preoxygenation: Pre-oxygenation with 100% oxygen Induction Type: IV induction Ventilation: Mask ventilation without difficulty Laryngoscope Size: Mac and 4 Grade View: Grade II Tube type: Oral Tube size: 8.5 mm Number of attempts: 1 Airway Equipment and Method: Stylet and Oral airway Placement Confirmation: ETT inserted through vocal cords under direct vision, positive ETCO2 and breath sounds checked- equal and bilateral Secured at: 23 cm Tube secured with: Tape Dental Injury: Teeth and Oropharynx as per pre-operative assessment

## 2020-12-25 NOTE — Discharge Instructions (Signed)
Flexible Bronchoscopy, Care After This sheet gives you information about how to care for yourself after your test. Your doctor may also give you more specific instructions. If you have problems or questions, contact your doctor. Follow these instructions at home: Eating and drinking Do not eat or drink anything (not even water) for 2 hours after your test, or until your numbing medicine (local anesthetic) wears off. When your numbness is gone and your cough and gag reflexes have come back, you may: Eat only soft foods. Slowly drink liquids. The day after the test, go back to your normal diet. Driving Do not drive for 24 hours if you were given a medicine to help you relax (sedative). Do not drive or use heavy machinery while taking prescription pain medicine. General instructions  Take over-the-counter and prescription medicines only as told by your doctor. Return to your normal activities as told. Ask what activities are safe for you. Do not use any products that have nicotine or tobacco in them. This includes cigarettes and e-cigarettes. If you need help quitting, ask your doctor. Keep all follow-up visits as told by your doctor. This is important. It is very important if you had a tissue sample (biopsy) taken. Get help right away if: You have shortness of breath that gets worse. You get light-headed. You feel like you are going to pass out (faint). You have chest pain. You cough up: More than a little blood. More blood than before. Summary Do not eat or drink anything (not even water) for 2 hours after your test, or until your numbing medicine wears off. Do not use cigarettes. Do not use e-cigarettes. Get help right away if you have chest pain.  Please call our office for any questions or concerns.  [phone].  This information is not intended to replace advice given to you by your health care provider. Make sure you discuss any questions you have with your health care  provider. Document Released: 02/24/2009 Document Revised: 04/11/2017 Document Reviewed: 05/17/2016 Elsevier Patient Education  2020 [employer].

## 2020-12-25 NOTE — Anesthesia Preprocedure Evaluation (Signed)
Anesthesia Evaluation  Patient identified by MRN, date of birth, ID band Patient awake    Reviewed: Allergy & Precautions, NPO status , Patient's Chart, lab work & pertinent test results  Airway Mallampati: II  TM Distance: >3 FB Neck ROM: Full    Dental no notable dental hx.    Pulmonary shortness of breath, Current Smoker and Patient abstained from smoking.,    Pulmonary exam normal breath sounds clear to auscultation       Cardiovascular negative cardio ROS Normal cardiovascular exam Rhythm:Regular Rate:Normal     Neuro/Psych  Headaches, Anxiety Depression negative psych ROS   GI/Hepatic Neg liver ROS, GERD  ,  Endo/Other  negative endocrine ROS  Renal/GU negative Renal ROS  negative genitourinary   Musculoskeletal negative musculoskeletal ROS (+)   Abdominal   Peds negative pediatric ROS (+)  Hematology negative hematology ROS (+)   Anesthesia Other Findings Pulmonary Nodule  Reproductive/Obstetrics negative OB ROS                             Anesthesia Physical Anesthesia Plan  ASA: 3  Anesthesia Plan: General   Post-op Pain Management:    Induction: Intravenous  PONV Risk Score and Plan: 2 and Ondansetron, Midazolam and Treatment may vary due to age or medical condition  Airway Management Planned: Oral ETT  Additional Equipment:   Intra-op Plan:   Post-operative Plan: Extubation in OR  Informed Consent: I have reviewed the patients History and Physical, chart, labs and discussed the procedure including the risks, benefits and alternatives for the proposed anesthesia with the patient or authorized representative who has indicated his/her understanding and acceptance.     Dental advisory given  Plan Discussed with: CRNA  Anesthesia Plan Comments:         Anesthesia Quick Evaluation

## 2020-12-25 NOTE — Interval H&P Note (Signed)
History and Physical Interval Note:  12/25/2020 9:12 AM  Amanda Murray  has presented today for surgery, with the diagnosis of PULMONARY NODULE.  The various methods of treatment have been discussed with the patient and family. After consideration of risks, benefits and other options for treatment, the patient has consented to  Procedure(s): ROBOTIC [name] (N/A) as a surgical intervention.  The patient's history has been reviewed, patient examined, no change in status, stable for surgery.  I have reviewed the patient's chart and labs.  Questions were answered to the patient's satisfaction.     Collene Gobble

## 2020-12-25 NOTE — Op Note (Addendum)
Video Bronchoscopy with Robotic Assisted Bronchoscopic Navigation   Date of Operation: 12/25/2020  Pre-op Diagnosis: Left upper lobe pulmonary nodule  Post-op Diagnosis: Same  Surgeon: Baltazar Apo  Assistants: None  Anesthesia: General endotracheal anesthesia  Operation: Flexible video fiberoptic bronchoscopy with robotic assistance and biopsies.  Estimated Blood Loss: Minimal  Complications: None  Indications and History: Amanda Murray is a 45 y.o. female with history of tobacco use.  She was found to have a left upper lobe pulmonary nodule on CT scan of the chest was performed for evaluation of fatigue and cough.  Recommendation was made to perform navigational bronchoscopy to achieve a tissue diagnosis.  The risks, benefits, complications, treatment options and expected outcomes were discussed with the patient.  The possibilities of pneumothorax, pneumonia, reaction to medication, pulmonary aspiration, perforation of a viscus, bleeding, failure to diagnose a condition and creating a complication requiring transfusion or operation were discussed with the patient who freely signed the consent.    Description of Procedure: The patient was seen in the Preoperative Area, was examined and was deemed appropriate to proceed.  The patient was taken to [facility] endoscopy room 3, identified as Beryle Quant and the procedure verified as Flexible Video Fiberoptic Bronchoscopy.  A Time Out was held and the above information confirmed.   Prior to the date of the procedure a high-resolution CT scan of the chest was performed. Utilizing ION software program a virtual tracheobronchial tree was generated to allow the creation of distinct navigation pathways to the patient's parenchymal abnormalities. After being taken to the operating room general anesthesia was initiated and the patient  was orally intubated. The video fiberoptic bronchoscope was introduced via the endotracheal tube and a general inspection was  performed which showed normal right and left lung anatomy, aspiration of the bilateral mainstems was completed to remove any remaining secretions. Robotic catheter inserted into patient's endotracheal tube.   Target #1 left upper lobe pulmonary nodule: The distinct navigation pathways prepared prior to this procedure were then utilized to navigate to patient's lesion identified on CT scan.  A smooth round somewhat vascular endobronchial lesion was seen in the airway.  The robotic catheter was secured into place and the vision probe was withdrawn.  Lesion location was approximated using fluoroscopy and radial endobronchial ultrasound for peripheral targeting. Under fluoroscopic guidance transbronchial needle biopsies, and transbronchial forceps biopsies were performed to be sent for cytology and pathology.   At the end of the procedure a general airway inspection was performed and there was no evidence of active bleeding. The bronchoscope was removed.  The patient tolerated the procedure well. There was no significant blood loss and there were no obvious complications. A post-procedural chest x-ray is pending.  Samples Target #1: Wang needle biopsies from LUL nodule 2. Transbronchial forceps biopsies from left upper lobe nodule   Plans:  The patient will be discharged from the PACU to home when recovered from anesthesia and after chest x-ray is reviewed. We will review the cytology, pathology results with the patient when they become available. Outpatient followup will be with Dr. Lamonte Sakai.    Baltazar Apo, MD, PhD 12/25/2020, 10:29 AM [facility] [phone] or if no answer before 7:00PM call [phone] For any issues after 7:00PM please call eLink [phone]

## 2020-12-25 NOTE — Transfer of Care (Signed)
Immediate Anesthesia Transfer of Care Note  Patient: Amanda Murray  Procedure(s) Performed: ROBOTIC BRONCHOSCOPY WITH ENDOBRONCHIAL NAVIGATION RADIAL ENDOBRONCHIAL ULTRASOUND BRONCHIAL BIOPSIES BRONCHIAL NEEDLE ASPIRATION BIOPSIES  Patient Location: Endoscopy Unit  Anesthesia Type:General  Level of Consciousness: awake, alert  and patient cooperative  Airway & Oxygen Therapy: Patient Spontanous Breathing and Patient connected to face mask oxygen  Post-op Assessment: Report given to RN and Post -op Vital signs reviewed and stable  Post vital signs: Reviewed  Last Vitals:  Vitals Value Taken Time  BP 127/49 12/25/20 1037  Temp 36.9 C 12/25/20 1037  Pulse 102 12/25/20 1038  Resp 26 12/25/20 1038  SpO2 100 % 12/25/20 1038  Vitals shown include unvalidated device data.  Last Pain:  Vitals:   12/25/20 1037  TempSrc: Oral  PainSc:          Complications: No notable events documented.

## 2020-12-26 ENCOUNTER — Encounter (HOSPITAL_COMMUNITY): Payer: Self-pay | Admitting: Emergency Medicine

## 2020-12-26 NOTE — Anesthesia Postprocedure Evaluation (Signed)
Anesthesia Post Note  Patient: Press photographer  Procedure(s) Performed: ROBOTIC BRONCHOSCOPY WITH ENDOBRONCHIAL NAVIGATION RADIAL ENDOBRONCHIAL ULTRASOUND BRONCHIAL BIOPSIES BRONCHIAL NEEDLE ASPIRATION BIOPSIES     Patient location during evaluation: PACU Anesthesia Type: General Level of consciousness: awake and alert Pain management: pain level controlled Vital Signs Assessment: post-procedure vital signs reviewed and stable Respiratory status: spontaneous breathing, nonlabored ventilation and respiratory function stable Cardiovascular status: blood pressure returned to baseline and stable Postop Assessment: no apparent nausea or vomiting Anesthetic complications: no   No notable events documented.  Last Vitals:  Vitals:   12/25/20 1105 12/25/20 1115  BP: 118/68   Pulse: 74 82  Resp: 13 15  Temp:    SpO2: 96% 97%    Last Pain:  Vitals:   12/25/20 1105  TempSrc:   PainSc: 0-No pain                 Lynda Rainwater

## 2020-12-28 ENCOUNTER — Telehealth: Payer: Self-pay | Admitting: Emergency Medicine

## 2020-12-28 LAB — CYTOLOGY - NON PAP

## 2020-12-28 NOTE — Telephone Encounter (Signed)
Per CY please  have RB address results.   Call made to patient, made aware she will have to wait to hear from [place]. Patient was still upset and tearful and stated I guess I don't have a choice.   Nothing further needed at this time.

## 2020-12-28 NOTE — Telephone Encounter (Signed)
I have called the pt and she is aware that Amanda Murray will call once the pathology reports are back.  Nothing further is needed.

## 2020-12-28 NOTE — Telephone Encounter (Signed)
Please call the pt and ley her know that I am still waiting for her pathology result. I will call her when it if available. Thanks.

## 2020-12-28 NOTE — Telephone Encounter (Signed)
Received call from very upset tearful patient stating her path results have posted to [facility] and she has googled everything and she has determined that she has cancer and wants to know why we have not contacted her to let her know. I apologized on everyone's behalf letting her know until the provider has reviewed the results per his message given to her earlier we are just going to have to wait. I also encouraged her not to utilize google to interpret any results and to wait to hear from the physician and or nurse. Patient states this unacceptable and I want someone to tell me what all this means now.   CY please advise if willing to review cytology report as RB is not in clinic today.

## 2020-12-29 NOTE — Telephone Encounter (Signed)
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 Reassured her about the diagnosis. We have an OV next month for PFT. Will plan next imaging at that time.

## 2021-01-02 ENCOUNTER — Ambulatory Visit (INDEPENDENT_AMBULATORY_CARE_PROVIDER_SITE_OTHER): Payer: BC Managed Care – PPO | Admitting: Otolaryngology

## 2021-01-02 ENCOUNTER — Encounter: Payer: Self-pay | Admitting: Internal Medicine

## 2021-01-02 ENCOUNTER — Ambulatory Visit: Payer: BC Managed Care – PPO | Admitting: Internal Medicine

## 2021-01-02 ENCOUNTER — Other Ambulatory Visit: Payer: Self-pay

## 2021-01-02 VITALS — BP 90/60 | HR 97 | Temp 98.0°F | Ht 70.0 in | Wt 164.0 lb

## 2021-01-02 DIAGNOSIS — F32A Depression, unspecified: Secondary | ICD-10-CM

## 2021-01-02 DIAGNOSIS — J310 Chronic rhinitis: Secondary | ICD-10-CM

## 2021-01-02 DIAGNOSIS — W540XXA Bitten by dog, initial encounter: Secondary | ICD-10-CM

## 2021-01-02 DIAGNOSIS — K219 Gastro-esophageal reflux disease without esophagitis: Secondary | ICD-10-CM | POA: Diagnosis not present

## 2021-01-02 DIAGNOSIS — S61451A Open bite of right hand, initial encounter: Secondary | ICD-10-CM | POA: Diagnosis not present

## 2021-01-02 DIAGNOSIS — Z87898 Personal history of other specified conditions: Secondary | ICD-10-CM

## 2021-01-02 DIAGNOSIS — F419 Anxiety disorder, unspecified: Secondary | ICD-10-CM

## 2021-01-02 DIAGNOSIS — R739 Hyperglycemia, unspecified: Secondary | ICD-10-CM | POA: Diagnosis not present

## 2021-01-02 DIAGNOSIS — J3489 Other specified disorders of nose and nasal sinuses: Secondary | ICD-10-CM

## 2021-01-02 MED ORDER — TRIAMCINOLONE ACETONIDE 55 MCG/ACT NA AERO: 2.0000 | INHALATION_SPRAY | Freq: Every day | NASAL | 12 refills | Status: DC

## 2021-01-02 MED ORDER — PANTOPRAZOLE SODIUM 40 MG PO TBEC: 40.0000 mg | DELAYED_RELEASE_TABLET | Freq: Every day | ORAL | 2 refills | Status: DC

## 2021-01-02 MED ORDER — AMOXICILLIN-POT CLAVULANATE 875-125 MG PO TABS: 1.0000 | ORAL_TABLET | Freq: Two times a day (BID) | ORAL | 0 refills | Status: DC

## 2021-01-02 MED ORDER — KETOROLAC TROMETHAMINE 30 MG/ML IJ SOLN
30.0000 mg | Freq: Once | INTRAMUSCULAR | Status: AC
  Administered 2021-01-02: 30 mg via INTRAMUSCULAR

## 2021-01-02 MED ORDER — CEFTRIAXONE SODIUM 1 G IJ SOLR
1.0000 g | Freq: Once | INTRAMUSCULAR | Status: AC
  Administered 2021-01-02: 1 g via INTRAMUSCULAR

## 2021-01-02 NOTE — Patient Instructions (Signed)
You had the pain shot today (toradol)  You had the antibiotic shot today (rocephin)  Please take all new medication as prescribed - the augmentin, and keep hand elevated as you can  You can continue to the ICE three times per day (20 min) for total 2-3 days only (after that, it is not much help)   Please continue all other medications as before, and refills have been done if requested.  Please have the pharmacy call with any other refills you may need.  Please keep your appointments with your specialists as you may have planned

## 2021-01-02 NOTE — Progress Notes (Signed)
HPI: Amanda Murray is a 45 y.o. female who presents is referred by her PCP Dr. Jenny Reichmann for evaluation of multiple complaints including chronic scabbing crusting in the nose as well as occasional bleeding and chronic soreness in the nostrils.  She is frequently picking out scabs from her nostrils.  She also complains of postnasal drainage, sore throat and intermittent hoarseness.  She has a fair amount of anxiety associated with all this.  She apparently has had recent bronchoscopy and bronchial biopsy for abnormality noted on x-ray. She does smoke 1 to 1 1/2 packs a day. She has previously been treated with doxycycline for sinus infection. She has no significant hoarseness on conversation in the office today.  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   UTI (lower urinary tract infection)    Past Surgical History:  Procedure Laterality Date   BRONCHIAL BIOPSY  12/25/2020   Procedure: BRONCHIAL BIOPSIES;  Surgeon: Collene Gobble, MD;  Location: [facility];  Service: Pulmonary;;   BRONCHIAL NEEDLE ASPIRATION BIOPSY  12/25/2020   Procedure: BRONCHIAL NEEDLE ASPIRATION BIOPSIES;  Surgeon: Collene Gobble, MD;  Location: [facility];  Service: Pulmonary;;   VIDEO BRONCHOSCOPY WITH ENDOBRONCHIAL NAVIGATION N/A 12/25/2020   Procedure: ROBOTIC BRONCHOSCOPY WITH ENDOBRONCHIAL NAVIGATION;  Surgeon: Collene Gobble, MD;  Location: [facility];  Service: Pulmonary;  Laterality: N/A;   VIDEO BRONCHOSCOPY WITH RADIAL ENDOBRONCHIAL ULTRASOUND  12/25/2020   Procedure: RADIAL ENDOBRONCHIAL ULTRASOUND;  Surgeon: Collene Gobble, MD;  Location: [facility];  Service: Pulmonary;;   WRIST SURGERY Right    Social History   Socioeconomic History   Marital status: Single    Spouse name: Not on file   Number of children: Not on file    Years of education: college   Highest education level: Not on file  Occupational History   Occupation: Hair Stylist  Tobacco Use   Smoking status: Every Day    Packs/day: 1.00    Years: 10.00    Pack years: 10.00    Types: Cigarettes   Smokeless tobacco: Never   Tobacco comments:    1 pack smoked daily 12/12/20 [facility]  Vaping Use   Vaping Use: Former  Substance and Sexual Activity   Alcohol use: Not Currently    Comment: couple glasses 3x week   Drug use: Not Currently    Frequency: 3.0 times per week    Types: Marijuana    Comment: small amount 3 x week   Sexual activity: Yes    Birth control/protection: None  Other Topics Concern   Not on file  Social History Narrative   Not on file   Social Determinants of Health   Financial Resource Strain: Not on file  Food Insecurity: Not on file  Transportation Needs: Not on file  Physical Activity: Not on file  Stress: Not on file  Social Connections: Not on file   Family History  Problem Relation Age of Onset   Alcohol abuse Other    Mental illness Other    Sudden death Other    Hypertension Other    Diabetes Other    Allergies  Allergen Reactions   Morphine And Related Nausea And Vomiting   Prior to Admission medications   Medication Sig Start Date End Date Taking? Authorizing Provider  amoxicillin-clavulanate (AUGMENTIN) 875-125 MG tablet Take 1 tablet by mouth 2 (two) times daily. 01/02/21   Biagio Borg, MD  amphetamine-dextroamphetamine (ADDERALL XR) 20 MG 24 hr capsule Take 20 mg by mouth daily. 08/02/20   [provider]  benzonatate (TESSALON) 100 MG capsule Take 1 capsule (100 mg total) by mouth 2 (two) times daily as needed for cough. 11/17/20   Perlie Mayo, NP  busPIRone (BUSPAR) 10 MG tablet Take 10 mg by mouth 3 (three) times daily. 08/04/20   [provider]  Cholecalciferol (VITAMIN D-3) 25 MCG (1000 UT) CAPS Take 1,000 Units by mouth daily. Soft gel    [provider]   Cyanocobalamin (VITAMIN B-12 PO) Take 5 mLs by mouth daily. 5000 mcg/ 5 ml    [provider]  folic acid (FOLVITE) 1 MG tablet Take 1 mg by mouth daily.    [provider]  gabapentin (NEURONTIN) 800 MG tablet Take 800 mg by mouth 3 (three) times daily.    [provider]  hydrocortisone (ANUSOL-HC) 2.5 % rectal cream Place 1 application rectally 2 (two) times daily. 11/24/20   Biagio Borg, MD  lamoTRIgine (LAMICTAL) 25 MG tablet Take 25 mg by mouth at bedtime. 11/27/20   [provider]  mupirocin nasal ointment (BACTROBAN) 2 % Place 1 application into the nose 2 (two) times daily. Use one-half of tube in each nostril twice daily for five (5) days. After application, press sides of nose together and gently massage. 11/23/20   Biagio Borg, MD  traZODone (DESYREL) 50 MG tablet Take 50 mg by mouth at bedtime as needed for sleep.    [provider]  valACYclovir (VALTREX) 1000 MG tablet Take 1 tablet (1,000 mg total) by mouth daily as needed (Cold Sore). 12/25/20   Collene Gobble, MD  venlafaxine XR (EFFEXOR-XR) 150 MG 24 hr capsule Take 150 mg by mouth See admin instructions. Take with 75 mg for at total of 225 mg in the morning 06/08/20   [provider]  venlafaxine XR (EFFEXOR-XR) 75 MG 24 hr capsule Take 75 mg by mouth See admin instructions. Take with 150 mg for a total of 225 mg daily    [provider]     Positive ROS: Otherwise negative  All other systems have been reviewed and were otherwise negative with the exception of those mentioned in the HPI and as above.  Physical Exam: Constitutional: Alert, well-appearing, no acute distress.  No significant hoarseness today in the office. Ears: External ears without lesions or tenderness. Ear canals are clear bilaterally.  Both TMs are clear today with no middle ear effusion noted or middle ear infection noted. Nasal: External nose without lesions. Septum is slightly deviated to  the right with moderate rhinitis.  After decongesting the nose with Afrin both middle meatus regions were clear with no evidence of mucopurulent discharge no polyps noted.  Of note she did have some raw area along the floor of the nostril more on the left side than the right but no evidence of neoplasm..  On nasal endoscopy both middle meatus regions were relatively clear with clear mucus discharge and no obvious mucopurulent discharge.  The nasopharynx was clear.  Could not identify any bleeding site within the nose on exam in the office today. Oral: Lips and gums without lesions. Tongue and palate mucosa without lesions. Posterior oropharynx clear.  Tonsils are benign in appearance bilaterally and average size.  No exudate noted. Fiberoptic laryngoscopy was  performed to the left nostril.  The nasopharynx was clear.  The base of tongue vallecula epiglottis were normal.  Vocal cords were clear bilaterally with normal vocal mobility.  Mild arytenoid edema consistent with possible reflux type symptoms but no mucosal abnormalities noted. Neck: No palpable adenopathy or masses.  No palpable adenopathy on either side and neck. Respiratory: Breathing comfortably  Skin: No facial/neck lesions or rash noted.  Laryngoscopy  Date/Time: 01/02/2021 5:05 PM Performed by: Rozetta Nunnery, MD Authorized by: Rozetta Nunnery, MD   Consent:    Consent obtained:  Verbal   Consent given by:  Patient Procedure details:    Indications: direct visualization of the upper aerodigestive tract and hoarseness, dysphagia, or aspiration     Instrument: flexible fiberoptic laryngoscope     Scope location: bilateral nare   Sinus:    Right middle meatus: normal     Left middle meatus: normal     Right nasopharynx: normal     Left nasopharynx: normal   Mouth:    Oropharynx: normal     Vallecula: normal     Base of tongue: normal     Epiglottis: normal   Throat:    True vocal cords: normal   Comments:      On fiberoptic laryngoscopy nasal passages revealed edema and clear mucus discharge but no signs of infection.  Hypopharynx and larynx was clear with no evidence of neoplasm and no evidence of active infection.  She does have mild edema of the arytenoid mucosa which may be indicative of reflux type symptoms.   Assessment: Chronic rhinitis with inflammatory changes of the nostrils left side worse than right. Sore throat with normal upper airway examination otherwise probably related to GE reflux disease. History of hoarseness with clear vocal cords on fiberoptic laryngoscopy and probably also related to GE reflux disease  Plan: Discussed with patient concerning not picking at her nose and to use Nasacort 2 sprays each nostril at night as this will help improve her nasal airway and reduce mucus discharge.  Also reviewed with her concerning use of saline irrigations for the nose to help reduce mucus discharge. Prescribed pantoprazole 40 mg daily before dinner for the next 2 months.  Apparently she is getting scheduled to see GI. She already has mupirocin 2% ointment to use in the nose when it is sore and recommended using this twice daily for a week if the nose is sore or bleeding. Also gave her information on nasal gel nasal spray that she can apply to the nose if the nose is crusting and dry. I do not see any evidence of active infection presently requiring further antibiotic therapy. Also reviewed with her concerning trying to stop smoking as this will also help her overall health which she is aware of. She will follow-up as needed   Radene Journey, MD   CC:

## 2021-01-03 DIAGNOSIS — F319 Bipolar disorder, unspecified: Secondary | ICD-10-CM | POA: Diagnosis not present

## 2021-01-03 DIAGNOSIS — F1020 Alcohol dependence, uncomplicated: Secondary | ICD-10-CM | POA: Diagnosis not present

## 2021-01-05 ENCOUNTER — Other Ambulatory Visit (INDEPENDENT_AMBULATORY_CARE_PROVIDER_SITE_OTHER): Payer: Self-pay | Admitting: Otolaryngology

## 2021-01-05 MED ORDER — PANTOPRAZOLE SODIUM 40 MG PO TBEC: 40.0000 mg | DELAYED_RELEASE_TABLET | Freq: Every day | ORAL | 2 refills | Status: DC

## 2021-01-06 ENCOUNTER — Encounter: Payer: Self-pay | Admitting: Internal Medicine

## 2021-01-06 NOTE — Assessment & Plan Note (Signed)
Lab Results  Component Value Date   HGBA1C 5.8 03/09/2020   Stable, pt to continue current medical treatment  - diet

## 2021-01-06 NOTE — Assessment & Plan Note (Signed)
Chronic moderate to severe, declines change in tx at this time

## 2021-01-06 NOTE — Assessment & Plan Note (Addendum)
With moderate to severe rapidly worsening sweling and pain, though non toxic and no s/s of compartment syndrome or abscess or even significant cellulitis; pt to continue home care, for rocephin 1 gm IM, toradol 30 mg IM now,  augmentin course and to report to ED for any worsening even in the next 1-2 days

## 2021-01-06 NOTE — Progress Notes (Signed)
Patient ID: Amanda Murray, female   DOB: 1976-02-04, 45 y.o.   MRN: [id number]        Chief Complaint: follow up right hand dog bite       HPI:  Amanda Murray is a 45 y.o. female here with c/o unfortunate dog bite to mid palm right hand last evening while trying to intervene in a neighbor dog fighting with hers; today, has rapidly worsening pain and sweling and at lest mild diffuse erythema related to right mid palm single but rather large puncture wound.  Denies high fever, chills, red streaks, drainage but pain now 8/10 though ot wanting pain med.  Has been using perosixde, ice and elevation but still worsening in last few hours today.  Pt denies chest pain, increased sob or doe, wheezing, orthopnea, PND, increased LE swelling, palpitations, dizziness or syncope.   Pt denies polydipsia, polyuria, or new focal neuro s/s.     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;  Service: Pulmonary;;   BRONCHIAL NEEDLE ASPIRATION BIOPSY  12/25/2020   Procedure: BRONCHIAL NEEDLE ASPIRATION BIOPSIES;  Surgeon: Collene Gobble, MD;  Location: [facility];  Service: Pulmonary;;   VIDEO BRONCHOSCOPY WITH ENDOBRONCHIAL NAVIGATION N/A 12/25/2020   Procedure: ROBOTIC BRONCHOSCOPY WITH ENDOBRONCHIAL NAVIGATION;  Surgeon:  Collene Gobble, MD;  Location: [facility];  Service: Pulmonary;  Laterality: N/A;   VIDEO BRONCHOSCOPY WITH RADIAL ENDOBRONCHIAL ULTRASOUND  12/25/2020   Procedure: RADIAL ENDOBRONCHIAL ULTRASOUND;  Surgeon: Collene Gobble, MD;  Location: [facility];  Service: Pulmonary;;   WRIST SURGERY Right     reports that she has been smoking cigarettes. She has a 10.00 pack-year smoking history. She has never used smokeless tobacco. She reports that she does not currently use alcohol. She reports that she does not currently use drugs after having used the following drugs: Marijuana. Frequency: 3.00 times per week. family history includes Alcohol abuse in an other family member; Diabetes in an other family member; Hypertension in an other family member; Mental illness in an other family member; Sudden death in an other family member. Allergies  Allergen Reactions   Morphine And Related Nausea And Vomiting   Current Outpatient Medications on File Prior to Visit  Medication Sig Dispense Refill   amphetamine-dextroamphetamine (ADDERALL XR) 20 MG 24 hr capsule Take 20 mg by mouth daily.     benzonatate (TESSALON) 100 MG capsule Take 1 capsule (100 mg total) by mouth 2 (two) times daily as needed for cough. 20 capsule 0   busPIRone (BUSPAR) 10 MG tablet Take 10 mg by mouth 3 (three) times daily.     Cholecalciferol (VITAMIN D-3) 25 MCG (1000 UT) CAPS Take 1,000 Units by mouth  daily. Soft gel     Cyanocobalamin (VITAMIN B-12 PO) Take 5 mLs by mouth daily. 5000 mcg/ 5 ml     folic acid (FOLVITE) 1 MG tablet Take 1 mg by mouth daily.     gabapentin (NEURONTIN) 800 MG tablet Take 800 mg by mouth 3 (three) times daily.     hydrocortisone (ANUSOL-HC) 2.5 % rectal cream Place 1 application rectally 2 (two) times daily. 30 g 0   lamoTRIgine (LAMICTAL) 25 MG tablet Take 25 mg by mouth at bedtime.     mupirocin nasal ointment (BACTROBAN) 2 % Place 1 application into the nose 2 (two) times daily. Use one-half of tube  in each nostril twice daily for five (5) days. After application, press sides of nose together and gently massage. 10 g 0   traZODone (DESYREL) 50 MG tablet Take 50 mg by mouth at bedtime as needed for sleep.     valACYclovir (VALTREX) 1000 MG tablet Take 1 tablet (1,000 mg total) by mouth daily as needed (Cold Sore).     venlafaxine XR (EFFEXOR-XR) 150 MG 24 hr capsule Take 150 mg by mouth See admin instructions. Take with 75 mg for at total of 225 mg in the morning     venlafaxine XR (EFFEXOR-XR) 75 MG 24 hr capsule Take 75 mg by mouth See admin instructions. Take with 150 mg for a total of 225 mg daily     triamcinolone (NASACORT) 55 MCG/ACT AERO nasal inhaler Place 2 sprays into the nose daily. 2 sprays each nostril at night when you go to bed 1 each 12   No current facility-administered medications on file prior to visit.        ROS:  All others reviewed and negative.  Objective        PE:  BP 90/60 (BP Location: Left Arm, Patient Position: Sitting, Cuff Size: Normal)   Pulse 97   Temp 98 F (36.7 C) (Oral)   Ht '5\' 10"'$  (1.778 m)   Wt 164 lb (74.4 kg)   LMP 12/23/2020 Comment: neg preg test 12/25/20  SpO2 99%   BMI 23.53 kg/m                 Constitutional: Pt appears in NAD               HENT: Head: NCAT.                Right Ear: External ear normal.                 Left Ear: External ear normal.                Eyes: . Pupils are equal, round, and reactive to light. Conjunctivae and EOM are normal               Nose: without d/c or deformity               Neck: Neck supple. Gross normal ROM               Cardiovascular: Normal rate and regular rhythm.                 Pulmonary/Chest: Effort normal and breath sounds without rales or wheezing.                Right hand with 6 mm puncture wound mid palm with 2+ diffuse swelling and tenderof palm and somewhat less of all fingers, diffuse tender worst at the  mid palp and faint diffuse erythema, but no red streaks, fluctuance or  drainage               Neurological: Pt is alert. At baseline orientation, motor grossly intact               Skin: Skin is warm. No rashes, no other new lesions, LE edema - none               Psychiatric: Pt behavior is normal without agitation , 2+ nervous  Micro: none  Cardiac tracings I have personally interpreted today:  none  Pertinent Radiological findings (summarize): none   Lab Results  Component Value Date   WBC 5.6 12/25/2020   HGB 9.3 (L) 12/25/2020   HCT 32.1 (L) 12/25/2020   PLT 311 12/25/2020   GLUCOSE 104 (H) 11/23/2020   CHOL 184 03/09/2020   TRIG 202.0 (H) 03/09/2020   HDL 51.30 03/09/2020   LDLDIRECT 109.0 03/09/2020   LDLCALC 100 (H) 02/09/2019   ALT 21 11/23/2020   AST 18 11/23/2020   NA 139 11/23/2020   K 3.8 11/23/2020   CL 107 11/23/2020   CREATININE 0.62 11/23/2020   BUN 8 11/23/2020   CO2 24 11/23/2020   TSH 1.21 11/23/2020   HGBA1C 5.8 03/09/2020   Assessment/Plan:  Ayza Delich is a 45 y.o. White or Caucasian [1] female with  has a past medical history of Anemia, Anxiety, Depression, GERD (gastroesophageal reflux disease), Helicobacter pylori ab+ (09/30/2013), Migraines, Recurrent cold sores (09/30/2013), and UTI (lower urinary tract infection).  Hyperglycemia Lab Results  Component Value Date   HGBA1C 5.8 03/09/2020   Stable, pt to continue current medical treatment  - diet   Dog bite of right hand With moderate to severe rapidly worsening sweling and pain, though non toxic and no s/s of compartment syndrome or abscess or even significant cellulitis; pt to continue home care, for rocephin 1 gm IM, toradol 30 mg IM now,  augmentin course and to report to ED for any worsening even in the next 1-2 days  Anxiety and depression Chronic moderate to severe, declines change in tx at this time  Sore in nose Has seen recent ENT evaluation with recommendation for increased allergy tx, and stop picking at nose  Followup: Return if symptoms worsen  or fail to improve.  Amanda Cower, MD 01/06/2021 4:21 PM [facility]

## 2021-01-06 NOTE — Assessment & Plan Note (Signed)
Has seen recent ENT evaluation with recommendation for increased allergy tx, and stop picking at nose

## 2021-01-09 ENCOUNTER — Ambulatory Visit: Payer: BC Managed Care – PPO | Admitting: Family Medicine

## 2021-01-09 ENCOUNTER — Encounter: Payer: Self-pay | Admitting: Family Medicine

## 2021-01-09 ENCOUNTER — Ambulatory Visit (INDEPENDENT_AMBULATORY_CARE_PROVIDER_SITE_OTHER): Payer: BC Managed Care – PPO

## 2021-01-09 ENCOUNTER — Other Ambulatory Visit: Payer: Self-pay

## 2021-01-09 ENCOUNTER — Encounter: Payer: Self-pay | Admitting: Internal Medicine

## 2021-01-09 ENCOUNTER — Ambulatory Visit: Payer: Self-pay

## 2021-01-09 VITALS — BP 110/76 | HR 98 | Ht 70.0 in | Wt 165.0 lb

## 2021-01-09 DIAGNOSIS — M791 Myalgia, unspecified site: Secondary | ICD-10-CM

## 2021-01-09 DIAGNOSIS — M79641 Pain in right hand: Secondary | ICD-10-CM

## 2021-01-09 DIAGNOSIS — F319 Bipolar disorder, unspecified: Secondary | ICD-10-CM | POA: Diagnosis not present

## 2021-01-09 DIAGNOSIS — R718 Other abnormality of red blood cells: Secondary | ICD-10-CM | POA: Diagnosis not present

## 2021-01-09 DIAGNOSIS — M79642 Pain in left hand: Secondary | ICD-10-CM

## 2021-01-09 DIAGNOSIS — G894 Chronic pain syndrome: Secondary | ICD-10-CM

## 2021-01-09 DIAGNOSIS — F1020 Alcohol dependence, uncomplicated: Secondary | ICD-10-CM | POA: Diagnosis not present

## 2021-01-09 DIAGNOSIS — D509 Iron deficiency anemia, unspecified: Secondary | ICD-10-CM

## 2021-01-09 NOTE — Progress Notes (Signed)
I, Wendy Poet, LAT, ATC, am serving as scribe for Dr. Lynne Leader.  Subjective:    CC: Bilat hand pain  HPI: Pt is a 45 y/o RHD female c/o bilat hand pain, R>L, x . Pt locates pain to her R forearm, wrist and hand .  She has a hx of a significant hand injury from 20 years ago that resulted in both vascular and nerve issues.  More recently, a dog bit her hand.  She also c/o all over body pain, including her hips and knees.  Numbness/tingling: yes but this is left over from her injury 20 years ago Aggravates: typing/computer work; repetitively closing her car trunk  Treatments tried: copper fit fingerless gloves; R wrist and hand popping/manipulation  She notes general overall body pain aching soreness occurring off and on for years now.  She has had some rheumatologic work-up that so far has been negative.  She does have a history of anemia thought to be iron deficiency.  Pertinent review of Systems: No fevers or chills  Relevant historical information: History of anxiety and depression.  History of former alcohol abuse currently in remission.   Objective:    Vitals:   01/09/21 1426  BP: 110/76  Pulse: 98  SpO2: 98%   General: Well Developed, well nourished, and in no acute distress.   MSK: Right hand and wrist no severe effusion or swelling. Small abrasion right palm but no surrounding erythema. Normal hand and wrist motion. Intact strength. Pulses cap refill sensation intact distally.  Left hand and wrist normal-appearing no severe swelling.  Normal motion and strength. Pulses capillary refill and sensation are intact distally  Point tender across multiple areas of upper and lower extremities.  Lab and Radiology Results  X-ray images bilateral hands obtained today personally and independently interpreted.  Right hand: No acute fractures.  No severe arthritis.  No erosions.  Left hand: No significant arthritis or erosions.  No acute fractures.  Await formal  radiology review  Recent Results (from the past 2160 hour(s))  Anti-DNA antibody, double-stranded     Status: None   Collection Time: 11/23/20 10:49 AM  Result Value Ref Range   ds DNA Ab <1 IU/mL    Comment:                            IU/mL       Interpretation                            < or = 4    Negative                            5-9         Indeterminate                            > or = 10   Positive .   ANA     Status: None   Collection Time: 11/23/20 10:49 AM  Result Value Ref Range   Anti Nuclear Antibody (ANA) NEGATIVE NEGATIVE    Comment: ANA IFA is a first line screen for detecting the presence of up to approximately 150 autoantibodies in various autoimmune diseases. A negative ANA IFA result suggests an ANA-associated autoimmune disease is not present at this time, but is not definitive. If there  is high clinical suspicion for Sjogren's syndrome, testing for anti-SS-A/Ro antibody should be considered. Anti-Jo-1 antibody should be considered for clinically suspected inflammatory myopathies. . AC-0: Negative . International Consensus on ANA Patterns (https://www.hernandez-brewer.com/) . For additional information, please refer to http://education.[facility].com/faq/FAQ177 (This link is being provided for informational/ educational purposes only.) .   Cyclic Citrul Peptide Antibody, IGG     Status: None   Collection Time: 11/23/20 10:49 AM  Result Value Ref Range   Cyclic Citrullin Peptide Ab <16 UNITS    Comment: Reference Range Negative:            <20 Weak Positive:       20-39 Moderate Positive:   40-59 Strong Positive:     >59 .   C-reactive protein     Status: None   Collection Time: 11/23/20 10:49 AM  Result Value Ref Range   CRP <1.0 0.5 - 20.0 mg/dL  Sedimentation rate     Status: Abnormal   Collection Time: 11/23/20 10:49 AM  Result Value Ref Range   Sed Rate 39 (H) 0 - 20 mm/hr  VITAMIN D 25 Hydroxy (Vit-D Deficiency,  Fractures)     Status: Abnormal   Collection Time: 11/23/20 10:49 AM  Result Value Ref Range   VITD 22.96 (L) 30.00 - 100.00 ng/mL  Vitamin B12     Status: Abnormal   Collection Time: 11/23/20 10:49 AM  Result Value Ref Range   Vitamin B-12 201 (L) 211 - 911 pg/mL  Basic metabolic panel     Status: Abnormal   Collection Time: 11/23/20 10:49 AM  Result Value Ref Range   Sodium 139 135 - 145 mEq/L   Potassium 3.8 3.5 - 5.1 mEq/L   Chloride 107 96 - 112 mEq/L   CO2 24 19 - 32 mEq/L   Glucose, Bld 104 (H) 70 - 99 mg/dL   BUN 8 6 - 23 mg/dL   Creatinine, Ser 0.62 0.40 - 1.20 mg/dL   GFR 108.23 >60.00 mL/min    Comment: Calculated using the CKD-EPI Creatinine Equation (2021)   Calcium 8.8 8.4 - 10.5 mg/dL  TSH     Status: None   Collection Time: 11/23/20 10:49 AM  Result Value Ref Range   TSH 1.21 0.35 - 5.50 uIU/mL  CBC with Differential/Platelet     Status: Abnormal   Collection Time: 11/23/20 10:49 AM  Result Value Ref Range   WBC 6.6 4.0 - 10.5 K/uL   RBC 4.86 3.87 - 5.11 Mil/uL   Hemoglobin 10.1 (L) 12.0 - 15.0 g/dL   HCT 32.0 (L) 36.0 - 46.0 %   MCV 65.7 Repeated and verified X2. (L) 78.0 - 100.0 fl   MCHC 31.5 30.0 - 36.0 g/dL   RDW 17.9 (H) 11.5 - 15.5 %   Platelets 286.0 150.0 - 400.0 K/uL   Neutrophils Relative % 64.6 43.0 - 77.0 %   Lymphocytes Relative 22.8 12.0 - 46.0 %   Monocytes Relative 9.6 3.0 - 12.0 %   Eosinophils Relative 1.5 0.0 - 5.0 %   Basophils Relative 1.5 0.0 - 3.0 %   Neutro Abs 4.2 1.4 - 7.7 K/uL   Lymphs Abs 1.5 0.7 - 4.0 K/uL   Monocytes Absolute 0.6 0.1 - 1.0 K/uL   Eosinophils Absolute 0.1 0.0 - 0.7 K/uL   Basophils Absolute 0.1 0.0 - 0.1 K/uL  Hepatic function panel     Status: None   Collection Time: 11/23/20 10:49 AM  Result Value Ref Range   Total Bilirubin  0.3 0.2 - 1.2 mg/dL   Bilirubin, Direct 0.1 0.0 - 0.3 mg/dL   Alkaline Phosphatase 79 39 - 117 U/L   AST 18 0 - 37 U/L   ALT 21 0 - 35 U/L   Total Protein 7.2 6.0 - 8.3 g/dL    Albumin 4.4 3.5 - 5.2 g/dL  Urinalysis, Routine w reflex microscopic     Status: Abnormal   Collection Time: 11/23/20 10:49 AM  Result Value Ref Range   Color, Urine YELLOW Yellow;Lt. Yellow;Straw;Dark Yellow;Amber;Green;Red;Brown   APPearance CLEAR Clear;Turbid;Slightly Cloudy;Cloudy   Specific Gravity, Urine <=1.005 (A) 1.000 - 1.030   pH 7.0 5.0 - 8.0   Total Protein, Urine TRACE (A) Negative   Urine Glucose NEGATIVE Negative   Ketones, ur NEGATIVE Negative   Bilirubin Urine NEGATIVE Negative   Hgb urine dipstick NEGATIVE Negative   Urobilinogen, UA 0.2 0.0 - 1.0   Leukocytes,Ua NEGATIVE Negative   Nitrite NEGATIVE Negative   WBC, UA 0-2/hpf 0-2/hpf   RBC / HPF none seen 0-2/hpf   Squamous Epithelial / LPF Rare(0-4/hpf) Rare(0-4/hpf)  SARS Coronavirus 2 (TAT 6-24 hrs)     Status: None   Collection Time: 12/22/20 12:00 AM  Result Value Ref Range   SARS Coronavirus 2 RESULT: NEGATIVE     Comment: RESULT: NEGATIVESARS-CoV-2 INTERPRETATION:A NEGATIVE  test result means that SARS-CoV-2 RNA was not present in the specimen above the limit of detection of this test. This does not preclude a possible SARS-CoV-2 infection and should not be used as the  sole basis for patient management decisions. Negative results must be combined with clinical observations, patient history, and epidemiological information. Optimum specimen types and timing for peak viral levels during infections caused by SARS-CoV-2  have not been determined. Collection of multiple specimens or types of specimens may be necessary to detect virus. Improper specimen collection and handling, sequence variability under primers/probes, or organism present below the limit of detection may  lead to false negative results. Positive and negative predictive values of testing are highly dependent on prevalence. False negative test results are more likely when prevalence of disease is high.The expected result is NEGATIVE.Fact S heet for   Healthcare Providers: LocalChronicle.no Sheet for Patients: SalonLookup.es Reference Range - Negative   Pregnancy, urine POC     Status: None   Collection Time: 12/25/20  7:40 AM  Result Value Ref Range   Preg Test, Ur NEGATIVE NEGATIVE    Comment:        THE SENSITIVITY OF THIS METHODOLOGY IS >24 mIU/mL   CBC per protocol     Status: Abnormal   Collection Time: 12/25/20  8:20 AM  Result Value Ref Range   WBC 5.6 4.0 - 10.5 K/uL   RBC 4.73 3.87 - 5.11 MIL/uL   Hemoglobin 9.3 (L) 12.0 - 15.0 g/dL   HCT 32.1 (L) 36.0 - 46.0 %   MCV 67.9 (L) 80.0 - 100.0 fL   MCH 19.7 (L) 26.0 - 34.0 pg   MCHC 29.0 (L) 30.0 - 36.0 g/dL   RDW 18.4 (H) 11.5 - 15.5 %   Platelets 311 150 - 400 K/uL    Comment: REPEATED TO VERIFY   nRBC 0.0 0.0 - 0.2 %    Comment: Performed at [facility], [place]. [address], [place], [place] [address]  Cytology - Non PAP;     Status: None   Collection Time: 12/25/20  9:49 AM  Result Value Ref Range   CYTOLOGY - NON GYN  CYTOLOGY - NON PAP CASE: [id number] PATIENT: [facility] Non-Gynecological Cytology Report     Clinical History: None provided    FINAL MICROSCOPIC DIAGNOSIS:  A. LUNG, LUL, FINE NEEDLE ASPIRATION: -  Neuroendocrine tumor -  See comment  COMMENT:  By immunohistochemistry, the neoplastic cells are positive for CD56, TTF-1, and synaptophysin but negative for Napsin A.  The proliferative rate by Ki-67 is low (<1%).  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  Dr. Saralyn Pilar reviewed the case and agrees with the above diagnosis.  Dr. Lamonte Sakai was notified of these results on December 28, 2020.  SPECIMEN ADEQUACY: A. Satisfactory for Evaluation  GROSS: Received is/are (A)(1) 2 Slides (1 Quick Stain). (2) 2 Slides (1 Quick Stain). Also included are 10cc's of red color fluid in saline solution from needle rinse.  Biopsy/tissue pieces also included in cell block. (f) Smears: (A) 4 Concentration Method (Th inPrep): (A) 1 Cell Block: (A) 1 Conventional Additional Studies: N/A  Final Diagnosis performed by Thressa Sheller, MD.   Electronically signed 12/28/2020 Technical and / or Professional components performed at [facility]. [facility], [place] [address], [place], [place] [address].  Immunohistochemistry Technical component (if applicable) was performed at [facility]. [address], [place], [place], [place] [address].   IMMUNOHISTOCHEMISTRY DISCLAIMER (if applicable): Some of these immunohistochemical stains may have been developed and the performance characteristics determine by [facility]. Some may not have been cleared or approved by the U.S. Food and Drug Administration. The FDA has determined that such clearance or approval is not necessary. This test is used for clinical purposes. It should not be regarded as investigational or for research. This laboratory is certified under the [place]  (CLIA-88) as qualified to perform high complexity clinical laboratory testing.  The controls stained appropriately.       Impression and Recommendations:    Assessment and Plan: 45 y.o. female with  Bilateral hand pain in the setting of a long history of more diffuse arthralgias and myalgias and a more generalized overall pain setting.  She did also had a good rheumatologic work-up last month that was significant for mildly elevated sed rate and significant microcytosis and low hemoglobin.  Differential includes fibromyalgia, so far undiagnosed rheumatologic issue, or more diffuse hand overuse pain.  Plan to treat with hand PT.  Complete rheumatologic work-up with HLA-B27, work-up microcytosis with TIBC iron stores and ferritin and recheck back in a few weeks.  The most likely explanation is that she probably has  fibromyalgia to explain her more diffuse overall pain and likely will need more further work-up and will probably see a lot of each other over the next several months while complete work-up and start a treatment process that hopefully will help.  PDMP not reviewed this encounter. Orders Placed This Encounter  Procedures   DG Hand Complete Right    Standing Status:   Future    Number of Occurrences:   1    Standing Expiration Date:   01/09/2022    Order Specific Question:   Reason for Exam (SYMPTOM  OR DIAGNOSIS REQUIRED)    Answer:   eval hand pain BL    Order Specific Question:   Is patient pregnant?    Answer:   No    Order Specific Question:   Preferred imaging location?    Answer:   [facility]   DG Hand Complete Left  Standing Status:   Future    Number of Occurrences:   1    Standing Expiration Date:   01/09/2022    Order Specific Question:   Reason for Exam (SYMPTOM  OR DIAGNOSIS REQUIRED)    Answer:   eval hand pain BL    Order Specific Question:   Is patient pregnant?    Answer:   No    Order Specific Question:   Preferred imaging location?    Answer:   [facility]   Iron, TIBC and Ferritin Panel    Standing Status:   Future    Number of Occurrences:   1    Standing Expiration Date:   01/09/2022   HLA-B27 antigen    Standing Status:   Future    Number of Occurrences:   1    Standing Expiration Date:   01/09/2022   Ambulatory referral to Physical Therapy    Referral Priority:   Routine    Referral Type:   Physical Medicine    Referral Reason:   Specialty Services Required    Requested Specialty:   Physical Therapy    Number of Visits Requested:   1   No orders of the defined types were placed in this encounter.   Discussed warning signs or symptoms. Please see discharge instructions. Patient expresses understanding.   The above documentation has been reviewed and is accurate and complete Lynne Leader, M.D.

## 2021-01-09 NOTE — Patient Instructions (Signed)
Thank you for coming in today.   I've referred you to Physical Therapy.  Let us know if you don't hear from them in one week.   Please get an Xray today before you leave   Please get labs today before you leave   Recheck in 3 weeks.   Look fibromyalgia.   Myofascial Pain Syndrome and Fibromyalgia Myofascial pain syndrome and fibromyalgia are both pain disorders. This pain may be felt mainly in your muscles. Myofascial pain syndrome: Always has tender points in the muscle that will cause pain when pressed (trigger points). The pain may come and go. Usually affects your neck, upper back, and shoulder areas. The pain often radiates into your arms and hands. Fibromyalgia: Has muscle pains and tenderness that come and go. Is often associated with fatigue and sleep problems. Has trigger points. Tends to be long-lasting (chronic), but is not life-threatening. Fibromyalgia and myofascial pain syndrome are not the same. However, they often occur together. If you have both conditions, each can make the other worse. Both are common and can cause enough pain and fatigue to make day-to-day activities difficult. Both can be hard to diagnose because their symptoms arecommon in many other conditions. What are the causes? The exact causes of these conditions are not known. What increases the risk? You are more likely to develop this condition if: You have a family history of the condition. You have certain triggers, such as: Spine disorders. An injury (trauma) or other physical stressors. Being under a lot of stress. Medical conditions such as osteoarthritis, rheumatoid arthritis, or lupus. What are the signs or symptoms? Fibromyalgia The main symptom of fibromyalgia is widespread pain and tenderness in yourmuscles. Pain is sometimes described as stabbing, shooting, or burning. You may also have: Tingling or numbness. Sleep problems and fatigue. Problems with attention and concentration (fibro  fog). Other symptoms may include:  Bowel and bladder problems. Headaches. Visual problems. Problems with odors and noises. Depression or mood changes. Painful menstrual periods (dysmenorrhea). Dry skin or eyes. These symptoms can vary over time. Myofascial pain syndrome Symptoms of myofascial pain syndrome include: Tight, ropy bands of muscle. Uncomfortable sensations in muscle areas. These may include aching, cramping, burning, numbness, tingling, and weakness. Difficulty moving certain parts of the body freely (poor range of motion). How is this diagnosed? This condition may be diagnosed by your symptoms and medical history. You will also have a physical exam. In general: Fibromyalgia is diagnosed if you have pain, fatigue, and other symptoms for more than 3 months, and symptoms cannot be explained by another condition. Myofascial pain syndrome is diagnosed if you have trigger points in your muscles, and those trigger points are tender and cause pain elsewhere in your body (referred pain). How is this treated? Treatment for these conditions depends on the type that you have. For fibromyalgia: Pain medicines, such as NSAIDs. Medicines for treating depression. Medicines for treating seizures. Medicines that relax the muscles. For myofascial pain: Pain medicines, such as NSAIDs. Cooling and stretching of muscles. Trigger point injections. Sound wave (ultrasound) treatments to stimulate muscles. Treating these conditions often requires a team of health care providers. These may include: Your primary care provider. Physical therapist. Complementary health care providers, such as massage therapists or acupuncturists. Psychiatrist for cognitive behavioral therapy. Follow these instructions at home: Medicines Take over-the-counter and prescription medicines only as told by your health care provider. Do not drive or use heavy machinery while taking prescription pain medicine. If you  are taking prescription  pain medicine, take actions to prevent or treat constipation. Your health care provider may recommend that you: Drink enough fluid to keep your urine pale yellow. Eat foods that are high in fiber, such as fresh fruits and vegetables, whole grains, and beans. Limit foods that are high in fat and processed sugars, such as fried or sweet foods. Take an over-the-counter or prescription medicine for constipation. Lifestyle  Exercise as directed by your health care provider or physical therapist. Practice relaxation techniques to control your stress. You may want to try: Biofeedback. Visual imagery. Hypnosis. Muscle relaxation. Yoga. Meditation. Maintain a healthy lifestyle. This includes eating a healthy diet and getting enough sleep. Do not use any products that contain nicotine or tobacco, such as cigarettes and e-cigarettes. If you need help quitting, ask your health care provider.  General instructions Talk to your health care provider about complementary treatments, such as acupuncture or massage. Consider joining a support group with others who are diagnosed with this condition. Do not do activities that stress or strain your muscles. This includes repetitive motions and heavy lifting. Keep all follow-up visits as told by your health care provider. This is important. Where to find more information National Fibromyalgia Association: www.fmaware.[place]: www.arthritis.org American Chronic Pain Association: www.theacpa.org Contact a health care provider if: You have new symptoms. Your symptoms get worse or your pain is severe. You have side effects from your medicines. You have trouble sleeping. Your condition is causing depression or anxiety. Summary Myofascial pain syndrome and fibromyalgia are pain disorders. Myofascial pain syndrome has tender points in the muscle that will cause pain when pressed (trigger points). Fibromyalgia also has  muscle pains and tenderness that come and go, but this condition is often associated with fatigue and sleep disturbances. Fibromyalgia and myofascial pain syndrome are not the same but often occur together, causing pain and fatigue that make day-to-day activities difficult. Treatment for fibromyalgia includes taking medicines to relax the muscles and medicines for pain, depression, or seizures. Treatment for myofascial pain syndrome includes taking medicines for pain, cooling and stretching of muscles, and injecting medicines into trigger points. Follow your health care provider's instructions for taking medicines and maintaining a healthy lifestyle. This information is not intended to replace advice given to you by your health care provider. Make sure you discuss any questions you have with your healthcare provider. Document Revised: 08/21/2018 Document Reviewed: 05/14/2017 Elsevier Patient Education  2022 [employer].

## 2021-01-10 ENCOUNTER — Encounter: Payer: Self-pay | Admitting: Family Medicine

## 2021-01-10 LAB — IRON,TIBC AND FERRITIN PANEL
%SAT: 2 % (calc) — ABNORMAL LOW (ref 16–45)
Ferritin: 3 ng/mL — ABNORMAL LOW (ref 16–232)
Iron: 11 ug/dL — ABNORMAL LOW (ref 40–190)
TIBC: 486 mcg/dL (calc) — ABNORMAL HIGH (ref 250–450)

## 2021-01-10 LAB — HLA-B27 ANTIGEN: HLA-B27 Antigen: NEGATIVE

## 2021-01-10 LAB — SPECIMEN COMPROMISED

## 2021-01-10 MED ORDER — TRIAMCINOLONE ACETONIDE 0.1 % EX CREA: 1.0000 "application " | TOPICAL_CREAM | Freq: Two times a day (BID) | CUTANEOUS | 0 refills | Status: DC

## 2021-01-10 NOTE — Progress Notes (Signed)
Initial lab test revealed severe iron deficiency.  You are profoundly iron deficient with no reserves.  This can cause symptoms beyond just anemia and can cause some neurologic symptoms such as pain and numbness and tingling and even restless leg syndrome.  I think it may be contributing to some of your symptoms.  If you are already taking oral iron I think you probably will benefit from IV iron infusions and I would like to refer you to hematology to get this set up as I have found this to be most effective in the past.  If you are not taking oral iron or have had trouble taking oral iron in the past please let me know.  I am referring out to hematology.

## 2021-01-10 NOTE — Addendum Note (Signed)
Addended by: Gregor Hams on: 01/10/2021 06:41 AM   Modules accepted: Orders

## 2021-01-11 ENCOUNTER — Telehealth: Payer: Self-pay | Admitting: Hematology

## 2021-01-11 NOTE — Telephone Encounter (Signed)
Scheduled appt per 9/1 referral. Pt is aware of appt date and time.

## 2021-01-11 NOTE — Progress Notes (Signed)
HLA-B27 which is a lab test for a rare type of inflammatory arthritis is negative

## 2021-01-12 ENCOUNTER — Encounter: Payer: Self-pay | Admitting: Family Medicine

## 2021-01-12 NOTE — Progress Notes (Signed)
X-ray left hand looks normal to radiology.

## 2021-01-12 NOTE — Progress Notes (Signed)
X-ray right hand looks normal to radiology

## 2021-01-16 ENCOUNTER — Encounter (INDEPENDENT_AMBULATORY_CARE_PROVIDER_SITE_OTHER): Payer: Self-pay

## 2021-01-18 ENCOUNTER — Inpatient Hospital Stay: Payer: BC Managed Care – PPO | Attending: Hematology | Admitting: Hematology

## 2021-01-18 ENCOUNTER — Encounter: Payer: Self-pay | Admitting: Hematology

## 2021-01-18 ENCOUNTER — Other Ambulatory Visit: Payer: Self-pay

## 2021-01-18 DIAGNOSIS — Z79899 Other long term (current) drug therapy: Secondary | ICD-10-CM

## 2021-01-18 DIAGNOSIS — C7A8 Other malignant neuroendocrine tumors: Secondary | ICD-10-CM | POA: Diagnosis not present

## 2021-01-18 DIAGNOSIS — F17210 Nicotine dependence, cigarettes, uncomplicated: Secondary | ICD-10-CM

## 2021-01-18 DIAGNOSIS — D509 Iron deficiency anemia, unspecified: Secondary | ICD-10-CM | POA: Insufficient documentation

## 2021-01-18 NOTE — Progress Notes (Signed)
[facility] [place]   [phone] Fax:([phone]) [place] consult Note   Patient Care Team: Biagio Borg, MD as PCP - General (Internal Medicine) 01/18/2021  CHIEF COMPLAINTS/PURPOSE OF CONSULTATION:  Iron Deficient Anemia  ASSESSMENT & PLAN:  Amanda Murray is a 45 y.o. premenopausal female with a history of anxiety, migraine, chronic abdominal pain and diarrhea  1. Iron Deficient Anemia -She has worsening anemia since October 2021.  CBC on 12/25/20 showed hemoglobin 9.3 with MCV 67.9.. Further work up showed iron 11, sat 2%, and ferritin 3 (01/09/21), which is consistent with iron deficient anemia. -has history of heavy periods, and more recently epistaxis. She also has fatigue, SOB, joint pain, and diarrhea.  -I discussed her iron deficiency is likely from menorrhagia, may be related to her chronic diarrhea also. I do recommend she complete work up with GI to rule out any stomach/digestive issues, especially given her diarrhea.  -She has poor tolerance to oral iron.  I recommend IV venofer 46m for her to receive weekly for the next three weeks. She will have labs in 3 weeks before her last infusion. -If her anemia does not resolve after adequate iron replacement, I may consider additional anemia work-up. -she will return for repeat labs and f/u in two months.  2. Neuroendocrine tumor of LUL -found on work up for SOB. Chest CT showed 1.1 cm LUL nodule, possibly endobronchial. -bronchoscopy on 12/25/20 by Dr. BLamonte Sakaishowed neuroendocrine tumor -f/u with Dr. BLamonte Sakainext week    PLAN: -IV venofer 40105mweekly x3 -lab in 3 weeks before infusion -lab and f/u in 2 months   HISTORY OF PRESENTING ILLNESS:  Amanda Wisdom89.o. female is here because of iron deficiency anemia.  She was found to have abnormal CBC on 12/25/20, for preoperative labs prior to her bronchoscopy. Her sports medicine doctor ordered additional work up with an iron panel, and she was found to  have very low iron and ferritin. She reports shortness of breath that began around 07/2020, fatigue with difficulty waking up, and joint pain. She denies recent chest pain on exertion, pre-syncopal episodes, or palpitations. She reports experiencing epistaxis for the last two months. She has dried blood/scabs present inside her nose because of it. She had not noticed any hematuria or hematochezia. She has not had colonoscopy (n/a due to age). She notes she has a referral placed for GI and is waiting to be scheduled. She is still having periods, which are heavy and have been for years. She has a history of fibroids. She has no prior history or diagnosis of cancer, or any family history of cancer. Her mammogram and pap are up-to-date (she goes to [facility] She reports eating a relatively low-carb diet. She never received blood transfusion. She has only donated blood maybe twice in her life. The patient was prescribed oral iron supplements, but they made her constipated.   She notes a history of a fall about 12 years ago that caused hip and leg issues. She has new hip pain to the top of her left buttock that she notes is different from her normal, deep hip pain. She reports body pain, mainly in her joints. She notes she wakes up with this pain. (HLA-B27 antigen was negative.) She notes previous alcohol abuse, but she quit a little over a year ago. Past use of marijuana, not currently. She has a history of anxiety, to which she attributes some of her chest discomfort. She reports diarrhea and  has a bowel movement ~3 times a day. She notes she has not tried imodium as she tries not to take medication. (Again, she is awaiting GI appointment.)   REVIEW OF SYSTEMS:   Constitutional: Denies fevers, chills or abnormal night sweats Eyes: Denies blurriness of vision, double vision or watery eyes Ears, nose, mouth, throat, and face: Denies mucositis or sore throat Respiratory: Denies cough or wheezes,  (+) SOB Cardiovascular: Denies palpitation, lower extremity swelling Gastrointestinal:  Denies nausea or heartburn, (+) diarrhea Skin: Denies abnormal skin rashes Lymphatics: Denies new lymphadenopathy or easy bruising Neurological: Denies numbness, tingling or new weaknesses Musculoskeletal: (+) joint pain Behavioral/Psych: Mood is stable, no new changes   All other systems were reviewed with the patient and are negative.   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   UTI (lower urinary tract infection)     SURGICAL HISTORY: Past Surgical History:  Procedure Laterality Date   BRONCHIAL BIOPSY  12/25/2020   Procedure: BRONCHIAL BIOPSIES;  Surgeon: Collene Gobble, MD;  Location: [facility];  Service: Pulmonary;;   BRONCHIAL NEEDLE ASPIRATION BIOPSY  12/25/2020   Procedure: BRONCHIAL NEEDLE ASPIRATION BIOPSIES;  Surgeon: Collene Gobble, MD;  Location: [facility];  Service: Pulmonary;;   VIDEO BRONCHOSCOPY WITH ENDOBRONCHIAL NAVIGATION N/A 12/25/2020   Procedure: ROBOTIC BRONCHOSCOPY WITH ENDOBRONCHIAL NAVIGATION;  Surgeon: Collene Gobble, MD;  Location: [facility];  Service: Pulmonary;  Laterality: N/A;   VIDEO BRONCHOSCOPY WITH RADIAL ENDOBRONCHIAL ULTRASOUND  12/25/2020   Procedure: RADIAL ENDOBRONCHIAL ULTRASOUND;  Surgeon: Collene Gobble, MD;  Location: [facility];  Service: Pulmonary;;   WRIST SURGERY Right     SOCIAL HISTORY: Social History   Socioeconomic History   Marital status: Single    Spouse name: Not on file   Number of children: 0   Years of education: college   Highest education level: Not on file  Occupational History   Occupation: Hair Stylist  Tobacco Use   Smoking status: Every Day    Packs/day: 1.00    Years: 30.00    Pack years: 30.00     Types: Cigarettes   Smokeless tobacco: Never   Tobacco comments:    [place]  Vaping Use   Vaping Use: Former  Substance and Sexual Activity   Alcohol use: Not Currently    Comment: Sober Date December 02, 2019 (1 year 1 mo.), 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   Drug use: Not Currently    Frequency: 3.0 times per week    Types: Marijuana    Comment: small amount 3 x week, not currently   Sexual activity: Not Currently    Birth control/protection: Abstinence, Condom  Other Topics Concern   Not on file  Social History Narrative   Not on file   Social Determinants of Health   Financial Resource Strain: Not on file  Food Insecurity: Not on file  Transportation Needs: Not on file  Physical Activity: Not on file  Stress: Not on file  Social Connections: Not on file  Intimate Partner Violence: Not on file    FAMILY HISTORY: Family History  Problem Relation Age of Onset   Alcohol abuse Other  Mental illness Other    Sudden death Other    Hypertension Other    Diabetes Other    Alcohol abuse Mother    Depression Mother    Drug abuse Mother    Early death Mother    Miscarriages / [place] Mother    Arthritis Father    Drug abuse Father    Early death Father    Kidney disease Father    Diabetes Maternal Grandfather    Heart disease Maternal Grandfather    Arthritis Maternal Grandmother    Hearing loss Maternal Grandmother    Varicose Veins Maternal Grandmother     ALLERGIES:  is allergic to morphine and related.  MEDICATIONS:  Current Outpatient Medications  Medication Sig Dispense Refill   amphetamine-dextroamphetamine (ADDERALL XR) 20 MG 24 hr capsule Take 20 mg by mouth daily.     busPIRone (BUSPAR) 10 MG tablet Take 10 mg by mouth 3 (three) times daily.     Cholecalciferol (VITAMIN D-3) 25 MCG (1000 UT) CAPS Take 1,000 Units by mouth daily. Soft gel     Cyanocobalamin (VITAMIN B-12 PO) Take 5 mLs  by mouth daily. 5000 mcg/ 5 ml     folic acid (FOLVITE) 1 MG tablet Take 1 mg by mouth daily.     gabapentin (NEURONTIN) 800 MG tablet Take 800 mg by mouth 3 (three) times daily.     hydrocortisone (ANUSOL-HC) 2.5 % rectal cream Place 1 application rectally 2 (two) times daily. 30 g 0   lamoTRIgine (LAMICTAL) 25 MG tablet Take 25 mg by mouth at bedtime.     mupirocin nasal ointment (BACTROBAN) 2 % Place 1 application into the nose 2 (two) times daily. Use one-half of tube in each nostril twice daily for five (5) days. After application, press sides of nose together and gently massage. 10 g 0   pantoprazole (PROTONIX) 40 MG tablet Take 1 tablet (40 mg total) by mouth daily. Take 1 tablet daily before dinner 30 tablet 2   traZODone (DESYREL) 50 MG tablet Take 50 mg by mouth at bedtime as needed for sleep.     triamcinolone cream (KENALOG) 0.1 % Apply 1 application topically 2 (two) times daily. 30 g 0   valACYclovir (VALTREX) 1000 MG tablet Take 1 tablet (1,000 mg total) by mouth daily as needed (Cold Sore).     venlafaxine XR (EFFEXOR-XR) 150 MG 24 hr capsule Take 150 mg by mouth See admin instructions. Take with 75 mg for at total of 225 mg in the morning     venlafaxine XR (EFFEXOR-XR) 75 MG 24 hr capsule Take 75 mg by mouth See admin instructions. Take with 150 mg for a total of 225 mg daily     No current facility-administered medications for this visit.    PHYSICAL EXAMINATION: ECOG PERFORMANCE STATUS: 1 - Symptomatic but completely ambulatory  Vitals:   01/18/21 1514  BP: (!) 141/86  Pulse: 85  Resp: 19  Temp: 97.7 F (36.5 C)  SpO2: 100%   Filed Weights   01/18/21 1514  Weight: 167 lb 6.4 oz (75.9 kg)    GENERAL:alert, no distress and comfortable SKIN: skin color, texture, turgor are normal, no rashes or significant lesions EYES: normal, conjunctiva are pink and non-injected, sclera clear OROPHARYNX:no exudate, no erythema and lips, buccal mucosa, and tongue normal  NECK:  supple, thyroid normal size, non-tender, without nodularity LYMPH:  no palpable lymphadenopathy in the cervical, axillary or inguinal LUNGS: clear to auscultation and percussion with normal breathing effort  HEART: regular rate & rhythm and no murmurs and no lower extremity edema ABDOMEN:abdomen soft, non-tender and normal bowel sounds Musculoskeletal:no cyanosis of digits and no clubbing  PSYCH: alert & oriented x 3 with fluent speech NEURO: no focal motor/sensory deficits  LABORATORY DATA:  I have reviewed the data as listed CBC Latest Ref Rng & Units 12/25/2020 11/23/2020 03/09/2020  WBC 4.0 - 10.5 K/uL 5.6 6.6 9.0  Hemoglobin 12.0 - 15.0 g/dL 9.3(L) 10.1(L) 11.5(L)  Hematocrit 36.0 - 46.0 % 32.1(L) 32.0(L) 35.9(L)  Platelets 150 - 400 K/uL 311 286.0 281.0    CMP Latest Ref Rng & Units 11/23/2020 03/09/2020 02/09/2019  Glucose 70 - 99 mg/dL 104(H) 88 91  BUN 6 - 23 mg/dL 8 12 16   Creatinine 0.40 - 1.20 mg/dL 0.62 0.71 0.79  Sodium 135 - 145 mEq/L 139 137 136  Potassium 3.5 - 5.1 mEq/L 3.8 4.0 4.2  Chloride 96 - 112 mEq/L 107 105 104  CO2 19 - 32 mEq/L 24 28 25   Calcium 8.4 - 10.5 mg/dL 8.8 8.8 9.6  Total Protein 6.0 - 8.3 g/dL 7.2 6.9 7.4  Total Bilirubin 0.2 - 1.2 mg/dL 0.3 0.2 0.3  Alkaline Phos 39 - 117 U/L 79 66 67  AST 0 - 37 U/L 18 18 15   ALT 0 - 35 U/L 21 25 14      RADIOGRAPHIC STUDIES: I have personally reviewed the radiological images as listed and agreed with the findings in the report. DG Chest Port 1 View  Result Date: 12/25/2020 CLINICAL DATA:  Bronchoscopy with biopsy. EXAM: PORTABLE CHEST 1 VIEW COMPARISON:  11/23/2020 and CT chest 11/29/2020. FINDINGS: Trachea is midline. Heart size normal. Lungs are clear. No pleural fluid. No pneumothorax. IMPRESSION: No acute findings. Electronically Signed   By: Lorin Picket M.D.   On: 12/25/2020 11:09   DG Hand Complete Left  Result Date: 01/12/2021 CLINICAL DATA:  Bilateral hand pain EXAM: LEFT HAND - COMPLETE 3+ VIEW  COMPARISON:  None. FINDINGS: There is no evidence of fracture or dislocation. There is no evidence of arthropathy or other focal bone abnormality. Soft tissues are unremarkable. IMPRESSION: Negative. Electronically Signed   By: Franchot Gallo M.D.   On: 01/12/2021 10:05   DG Hand Complete Right  Result Date: 01/12/2021 CLINICAL DATA:  Bilateral hand pain, right greater than left. No known injury. EXAM: RIGHT HAND - COMPLETE 3+ VIEW COMPARISON:  None. FINDINGS: The bone mineralization appears within normal limits. The soft tissues appear normal. No acute fracture or subluxation identified. The joint spaces are well preserved. No suspicious bone erosions. IMPRESSION: Negative. Electronically Signed   By: Kerby Moors M.D.   On: 01/12/2021 10:05   DG C-ARM BRONCHOSCOPY  Result Date: 12/25/2020 C-ARM BRONCHOSCOPY: Fluoroscopy was utilized by the requesting physician.  No radiographic interpretation.    Orders Placed This Encounter  Procedures   Ferritin    Standing Status:   Standing    Number of Occurrences:   10    Standing Expiration Date:   01/18/2022   Iron and TIBC    Standing Status:   Standing    Number of Occurrences:   10    Standing Expiration Date:   01/18/2022   CBC with Differential ([facility] Only)    Standing Status:   Standing    Number of Occurrences:   10    Standing Expiration Date:   01/18/2022   Celiac panel 10    Standing Status:   Future    Standing  Expiration Date:   01/18/2022   Ambulatory referral to Gastroenterology    Referral Priority:   Routine    Referral Type:   Consultation    Referral Reason:   Specialty Services Required    Number of Visits Requested:   1     All questions were answered. The patient knows to call the clinic with any problems, questions or concerns. The total time spent in the appointment was 35 minutes.     Truitt Merle, MD 01/18/2021 8:52 PM  I, Wilburn Mylar, am acting as scribe for Truitt Merle, MD.   I have reviewed the above  documentation for accuracy and completeness, and I agree with the above.

## 2021-01-22 DIAGNOSIS — F19120 Other psychoactive substance abuse with intoxication, uncomplicated: Secondary | ICD-10-CM | POA: Diagnosis not present

## 2021-01-22 DIAGNOSIS — F331 Major depressive disorder, recurrent, moderate: Secondary | ICD-10-CM | POA: Diagnosis not present

## 2021-01-23 ENCOUNTER — Ambulatory Visit: Payer: BC Managed Care – PPO | Admitting: Emergency Medicine

## 2021-01-23 ENCOUNTER — Other Ambulatory Visit: Payer: Self-pay

## 2021-01-23 ENCOUNTER — Telehealth: Payer: Self-pay

## 2021-01-23 ENCOUNTER — Ambulatory Visit (INDEPENDENT_AMBULATORY_CARE_PROVIDER_SITE_OTHER): Payer: BC Managed Care – PPO | Admitting: Emergency Medicine

## 2021-01-23 DIAGNOSIS — R911 Solitary pulmonary nodule: Secondary | ICD-10-CM | POA: Diagnosis not present

## 2021-01-23 LAB — PULMONARY FUNCTION TEST
DL/VA % pred: 103 %
DL/VA: 4.34 ml/min/mmHg/L
DLCO cor % pred: 103 %
DLCO cor: 27.04 ml/min/mmHg
DLCO unc % pred: 87 %
DLCO unc: 22.89 ml/min/mmHg
FEF 25-75 Post: 4.46 L/sec
FEF 25-75 Pre: 4.04 L/sec
FEF2575-%Change-Post: 10 %
FEF2575-%Pred-Post: 133 %
FEF2575-%Pred-Pre: 120 %
FEV1-%Change-Post: 2 %
FEV1-%Pred-Post: 109 %
FEV1-%Pred-Pre: 107 %
FEV1-Post: 3.88 L
FEV1-Pre: 3.79 L
FEV1FVC-%Change-Post: 2 %
FEV1FVC-%Pred-Pre: 100 %
FEV6-%Change-Post: 0 %
FEV6-%Pred-Post: 107 %
FEV6-%Pred-Pre: 107 %
FEV6-Post: 4.62 L
FEV6-Pre: 4.62 L
FEV6FVC-%Pred-Post: 102 %
FEV6FVC-%Pred-Pre: 102 %
FVC-%Change-Post: 0 %
FVC-%Pred-Post: 104 %
FVC-%Pred-Pre: 104 %
FVC-Post: 4.62 L
FVC-Pre: 4.62 L
Post FEV1/FVC ratio: 84 %
Post FEV6/FVC ratio: 100 %
Pre FEV1/FVC ratio: 82 %
Pre FEV6/FVC Ratio: 100 %
RV % pred: 141 %
RV: 2.79 L
TLC % pred: 122 %
TLC: 7.32 L

## 2021-01-23 NOTE — Progress Notes (Signed)
Full PFT performed today. °

## 2021-01-23 NOTE — Telephone Encounter (Signed)
ATC call pt r/t rescheduling OV following PFT today. Left voicemail for pt to return call to office if pt does so, pt will need to be rescheduled for OV as Dr. Lamonte Sakai is not available today.

## 2021-01-23 NOTE — Patient Instructions (Signed)
Full PFT performed today. °

## 2021-01-29 ENCOUNTER — Other Ambulatory Visit: Payer: Self-pay

## 2021-01-29 ENCOUNTER — Ambulatory Visit: Payer: BC Managed Care – PPO | Admitting: Family Medicine

## 2021-01-29 ENCOUNTER — Ambulatory Visit: Payer: Self-pay

## 2021-01-29 VITALS — BP 116/82 | HR 105 | Ht 70.0 in | Wt 166.2 lb

## 2021-01-29 DIAGNOSIS — M25551 Pain in right hip: Secondary | ICD-10-CM | POA: Diagnosis not present

## 2021-01-29 DIAGNOSIS — G8929 Other chronic pain: Secondary | ICD-10-CM

## 2021-01-29 DIAGNOSIS — D509 Iron deficiency anemia, unspecified: Secondary | ICD-10-CM

## 2021-01-29 DIAGNOSIS — M25552 Pain in left hip: Secondary | ICD-10-CM

## 2021-01-29 NOTE — Progress Notes (Signed)
I, Peterson Lombard, LAT, ATC acting as a scribe for Lynne Leader, MD.  Amanda Murray is a 45 y.o. female who presents to [facility] today for f/u bilat hand pain and diffuse arthralgias and myalgias. Pt was last seen by Dr. Georgina Snell on 01/09/21 and a complete rheumatologic work-up with HLA-B27, work-up microcytosis with TIBC iron stores and ferritin, was referred to hand PT at [facility]. Additionally, based on lab test results, pt was advised to take oral iron supplements and was referred to hematology to set up an iron infusion. Her first visit was on 01/18/21 and is planned to start IV venofer 424m weekly x3 weeks. Today, pt reports she wants to discuss her hips and was seen previously by Dr. RPaulla Foreon 08/09/20 and had a steroid injection. Pt reports hips flare every so often. Pt recalls an injury where she fell off of a floating dock. 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 Pt locates pain to the anterior aspect of bilat hips and sometimes along the lateral aspect w/ mechanical symptoms.  Dx testing: 01/09/21 R & L hand XR  01/09/21 Labs (HLA-B27, iron, TIBC, and Ferritin panel)  Pertinent review of systems: No fevers or chills  Relevant historical information: Severe iron deficiency   Exam:  BP 116/82   Pulse (!) 105   Ht 5' 10"  (1.778 m)   Wt 166 lb 3.2 oz (75.4 kg)   SpO2 98%   BMI 23.85 kg/m  General: Well Developed, well nourished, and in no acute distress.   MSK: Left hip normal-appearing normal motion tender palpation greater trochanter hip abduction strength is diminished 4/5.  External rotation strength diminished 4/5. Right hip normal-appearing normal motion tender palpation greater trochanter hip abduction strength is diminished 4/5.  External rotation strength diminished 4/5.    Lab and Radiology Results  Procedure: Real-time Ultrasound Guided Injection of left hip greater trochanter bursa Device: Philips Affiniti 50G Images  permanently stored and available for review in PACS Verbal informed consent obtained.  Discussed risks and benefits of procedure. Warned about infection bleeding damage to structures skin hypopigmentation and fat atrophy among others. Patient expresses understanding and agreement Time-out conducted.   Noted no overlying erythema, induration, or other signs of local infection.   Skin prepped in a sterile fashion.   Local anesthesia: Topical Ethyl chloride.   With sterile technique and under real time ultrasound guidance: 40 mg of Kenalog and 2 mL of lidocaine injected into bursa. Fluid seen entering the greater trochanter bursa.   Completed without difficulty   Pain immediately resolved suggesting accurate placement of the medication.   Advised to call if fevers/chills, erythema, induration, drainage, or persistent bleeding.   Images permanently stored and available for review in the ultrasound unit.  Impression: Technically successful ultrasound guided injection.    Procedure: Real-time Ultrasound Guided Injection of right hip greater trochanter bursa Device: Philips Affiniti 50G Images permanently stored and available for review in PACS Verbal informed consent obtained.  Discussed risks and benefits of procedure. Warned about infection bleeding damage to structures skin hypopigmentation and fat atrophy among others. Patient expresses understanding and agreement Time-out conducted.   Noted no overlying erythema, induration, or other signs of local infection.   Skin prepped in a sterile fashion.   Local anesthesia: Topical Ethyl chloride.   With sterile technique and under real time ultrasound guidance: 40 mg of Kenalog and 2 mL of lidocaine injected into greater trochanter bursa. Fluid seen  entering the bursa.   Completed without difficulty   Pain immediately resolved suggesting accurate placement of the medication.   Advised to call if fevers/chills, erythema, induration, drainage, or  persistent bleeding.   Images permanently stored and available for review in the ultrasound unit.  Impression: Technically successful ultrasound guided injection.        Assessment and Plan: 45 y.o. female with bilateral hip pain thought primarily due to greater trochanteric bursitis.  She may have some hip impingement as well.  It seems as though she may have had an anterior intra-articular hip injection by Dr. Paulla Fore earlier this year.  Recheck back in a month.  If not improved would consider x-ray and further evaluation.  Additionally will proceed with hip abduction strengthening taught in clinic today by ATC.  She certainly has many other MSK issues that are generating pain.  I am hopeful that some of these may be improved with improving her iron deficiency with IV iron.   PDMP not reviewed this encounter. Orders Placed This Encounter  Procedures   [place] LIMITED JOINT SPACE STRUCTURES LOW BILAT(NO LINKED CHARGES)    Standing Status:   Future    Number of Occurrences:   1    Standing Expiration Date:   07/29/2021    Order Specific Question:   Reason for Exam (SYMPTOM  OR DIAGNOSIS REQUIRED)    Answer:   bilateral hip pain    Order Specific Question:   Preferred imaging location?    Answer:   [place]   No orders of the defined types were placed in this encounter.    Discussed warning signs or symptoms. Please see discharge instructions. Patient expresses understanding.   The above documentation has been reviewed and is accurate and complete Lynne Leader, M.D.

## 2021-01-29 NOTE — Patient Instructions (Addendum)
Good to see you today.  You had B hip/GT injections today.  Call or go to the ER if you develop a large red swollen joint with extreme pain or oozing puss.   Please perform the exercise program that we have prepared for you and gone over in detail on a daily basis.  In addition to the handout you were provided you can access your program through: [email or web address]   Your unique program code is:  [place]  Return in 1 month.

## 2021-01-31 ENCOUNTER — Other Ambulatory Visit: Payer: Self-pay

## 2021-01-31 ENCOUNTER — Inpatient Hospital Stay: Payer: BC Managed Care – PPO

## 2021-01-31 VITALS — BP 136/77 | HR 92 | Temp 98.3°F | Resp 17

## 2021-01-31 DIAGNOSIS — D500 Iron deficiency anemia secondary to blood loss (chronic): Secondary | ICD-10-CM

## 2021-01-31 DIAGNOSIS — D509 Iron deficiency anemia, unspecified: Secondary | ICD-10-CM | POA: Diagnosis not present

## 2021-01-31 MED ORDER — LORATADINE 10 MG PO TABS
10.0000 mg | ORAL_TABLET | Freq: Once | ORAL | Status: AC
Start: 2021-01-31 — End: 2021-01-31
  Administered 2021-01-31: 10 mg via ORAL
  Filled 2021-01-31: qty 1

## 2021-01-31 MED ORDER — SODIUM CHLORIDE 0.9 % IV SOLN: Freq: Once | INTRAVENOUS | Status: AC

## 2021-01-31 MED ORDER — SODIUM CHLORIDE 0.9 % IV SOLN
400.0000 mg | Freq: Once | INTRAVENOUS | Status: AC
  Administered 2021-01-31: 400 mg via INTRAVENOUS
  Filled 2021-01-31: qty 20

## 2021-01-31 NOTE — Patient Instructions (Signed)

## 2021-02-02 ENCOUNTER — Encounter: Payer: Self-pay | Admitting: Gastroenterology

## 2021-02-05 DIAGNOSIS — F319 Bipolar disorder, unspecified: Secondary | ICD-10-CM | POA: Diagnosis not present

## 2021-02-05 DIAGNOSIS — F1020 Alcohol dependence, uncomplicated: Secondary | ICD-10-CM | POA: Diagnosis not present

## 2021-02-06 ENCOUNTER — Other Ambulatory Visit: Payer: Self-pay

## 2021-02-06 ENCOUNTER — Encounter: Payer: Self-pay | Admitting: Emergency Medicine

## 2021-02-06 ENCOUNTER — Ambulatory Visit (INDEPENDENT_AMBULATORY_CARE_PROVIDER_SITE_OTHER): Payer: BC Managed Care – PPO | Admitting: Emergency Medicine

## 2021-02-06 VITALS — BP 126/74 | HR 100 | Temp 98.2°F | Ht 70.0 in | Wt 167.8 lb

## 2021-02-06 DIAGNOSIS — D3A090 Benign carcinoid tumor of the bronchus and lung: Secondary | ICD-10-CM

## 2021-02-06 DIAGNOSIS — F17200 Nicotine dependence, unspecified, uncomplicated: Secondary | ICD-10-CM | POA: Diagnosis not present

## 2021-02-06 DIAGNOSIS — R911 Solitary pulmonary nodule: Secondary | ICD-10-CM

## 2021-02-06 DIAGNOSIS — R0602 Shortness of breath: Secondary | ICD-10-CM

## 2021-02-06 NOTE — Patient Instructions (Addendum)
We should plan to repeat your CT scan of the chest in July 2023 to keep surveillance on your carcinoid. We reviewed your pulmonary function testing today.  They show overall normal airflows but some possible hyperinflation that could reflect early changes seen with cigarette smoking. Our goal will be to help her stop smoking.  Start by rationing your cigarettes and we will slowly cut down over time.  Plan will be to wean to as low as possible and then talk about setting a quit date. Follow Dr. Lamonte Sakai in July 2023 after your CT scan so that we can review the results together, sooner if you have any problems.

## 2021-02-06 NOTE — Progress Notes (Addendum)
Subjective:    Patient ID: Amanda Murray, female    DOB: 24-Jun-1975, 45 y.o.   MRN: [id number]  HPI 45 year old woman with history of tobacco use (~30 pack years), remote H pylori, anxiety/depression, migraines.  She is referred today for evaluation of CT scan of the chest.  She reports that she is having progressive exertional SOB - happens with walking the dog, carrying groceries, has been progressive over a few months. She has to stop to rest. She has cough in the am when she gets up, can also happen through the day, not always associated with exertion. Has been present for over a year. She often feels the need to take a sigh or deep breath. She has heard some wheeze intermittently. She has chronic sore throat.   CT scan of the chest 11/29/2020 reviewed by me, shows an approximately 1 cm left suprahilar nodule in the left upper lobe   ROV 02/06/21 --follow-up visit for 44 year old woman with a history of tobacco, migraines, remote H. pylori.  We performed bronchoscopy to evaluate a 1 cm left upper lobe pulmonary nodule on 12/25/2020, pathology consistent with a carcinoid tumor. She reports today that she has had some exertional SOB, has been dx with Fe def anemia. She always feels exhausted.   Pulmonary function testing performed today and reviewed by me, show normal airflows without a bronchodilator response, hyperinflation based on an elevated TLC and RV, normal diffusion capacity.   Review of Systems As per HPI        Objective:   Physical Exam Vitals:   02/06/21 1507  BP: 126/74  Pulse: 100  Temp: 98.2 F (36.8 C)  TempSrc: Oral  SpO2: 100%  Weight: 167 lb 12.8 oz (76.1 kg)  Height: 5\' 10"  (1.778 m)   Gen: Pleasant, well-nourished, in no distress,  normal affect  ENT: No lesions,  mouth clear,  oropharynx clear, no postnasal drip  Neck: No JVD, no stridor  Lungs: No use of accessory muscles, no crackles or wheezing on normal respiration, no wheeze on forced  expiration  Cardiovascular: RRR, heart sounds normal, no murmur or gallops, no peripheral edema  Musculoskeletal: No deformities, no cyanosis or clubbing  Neuro: alert, awake, non focal  Skin: Warm, no lesions or rash      Assessment & Plan:  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  It is endobronchial in one of the smaller airways in the left upper lobe, identified on robotic navigational bronchoscopy.  Needs to be followed clinically and radiographically.  No clear indication for surgical referral at this time.  Discussed this with her today.  We will plan to repeat her CT scan of the chest in July 2023 to look for interval change.  If it is not growing, not changing significantly then we can probably space out the frequency of imaging.  Smoker Discussed with her today.  Her pulmonary function testing does show evidence of hyperinflation although her airflows are normal.  Talked about the possibility to there could be some early signs for evolving COPD.  I have encouraged her to work on quitting.  We talked about the strategy of rationing her cigarettes and then slowly decreasing the number that she smokes each day.  When she gets down to a lower number we can talk about setting a quit date.  Dyspnea Pulmonary function testing reassuring with the exception of some evidence for mild hyperinflation.  Normal airflows.  I suspect that any component of obstructive lung disease is subclinical  and would not be contributing to her dyspnea.  She does have newly identified significant iron deficiency anemia which would be a more likely explanation.   Baltazar Apo, MD, PhD 02/06/2021, 5:22 PM [facility] [phone] or if no answer before 7:00PM call [phone] For any issues after 7:00PM please call eLink [phone]

## 2021-02-06 NOTE — Assessment & Plan Note (Signed)
Discussed with her today.  Her pulmonary function testing does show evidence of hyperinflation although her airflows are normal.  Talked about the possibility to there could be some early signs for evolving COPD.  I have encouraged her to work on quitting.  We talked about the strategy of rationing her cigarettes and then slowly decreasing the number that she smokes each day.  When she gets down to a lower number we can talk about setting a quit date.

## 2021-02-06 NOTE — Assessment & Plan Note (Signed)
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  It is endobronchial in one of the smaller airways in the left upper lobe, identified on robotic navigational bronchoscopy.  Needs to be followed clinically and radiographically.  No clear indication for surgical referral at this time.  Discussed this with her today.  We will plan to repeat her CT scan of the chest in July 2023 to look for interval change.  If it is not growing, not changing significantly then we can probably space out the frequency of imaging.

## 2021-02-06 NOTE — Assessment & Plan Note (Signed)
Pulmonary function testing reassuring with the exception of some evidence for mild hyperinflation.  Normal airflows.  I suspect that any component of obstructive lung disease is subclinical and would not be contributing to her dyspnea.  She does have newly identified significant iron deficiency anemia which would be a more likely explanation.

## 2021-02-07 ENCOUNTER — Other Ambulatory Visit: Payer: Self-pay

## 2021-02-07 ENCOUNTER — Inpatient Hospital Stay: Payer: BC Managed Care – PPO

## 2021-02-07 VITALS — BP 114/76 | HR 80 | Temp 98.2°F | Resp 18

## 2021-02-07 DIAGNOSIS — D500 Iron deficiency anemia secondary to blood loss (chronic): Secondary | ICD-10-CM

## 2021-02-07 DIAGNOSIS — D509 Iron deficiency anemia, unspecified: Secondary | ICD-10-CM | POA: Diagnosis not present

## 2021-02-07 MED ORDER — SODIUM CHLORIDE 0.9 % IV SOLN: Freq: Once | INTRAVENOUS | Status: AC

## 2021-02-07 MED ORDER — SODIUM CHLORIDE 0.9 % IV SOLN
400.0000 mg | Freq: Once | INTRAVENOUS | Status: AC
  Administered 2021-02-07: 400 mg via INTRAVENOUS
  Filled 2021-02-07: qty 20

## 2021-02-07 MED ORDER — LORATADINE 10 MG PO TABS
10.0000 mg | ORAL_TABLET | Freq: Once | ORAL | Status: AC
  Administered 2021-02-07: 10 mg via ORAL
  Filled 2021-02-07: qty 1

## 2021-02-07 NOTE — Progress Notes (Signed)
Pt agreed to stay for 15 minutes of post iron obs. VSS at discharge

## 2021-02-07 NOTE — Patient Instructions (Signed)

## 2021-02-12 DIAGNOSIS — F319 Bipolar disorder, unspecified: Secondary | ICD-10-CM | POA: Diagnosis not present

## 2021-02-12 DIAGNOSIS — F1020 Alcohol dependence, uncomplicated: Secondary | ICD-10-CM | POA: Diagnosis not present

## 2021-02-14 ENCOUNTER — Other Ambulatory Visit: Payer: Self-pay

## 2021-02-14 ENCOUNTER — Inpatient Hospital Stay: Payer: BC Managed Care – PPO

## 2021-02-14 ENCOUNTER — Other Ambulatory Visit: Payer: Self-pay | Admitting: Nurse Practitioner

## 2021-02-14 ENCOUNTER — Inpatient Hospital Stay: Payer: BC Managed Care – PPO | Attending: Hematology

## 2021-02-14 VITALS — BP 133/91 | HR 91 | Temp 97.9°F | Resp 18

## 2021-02-14 DIAGNOSIS — D509 Iron deficiency anemia, unspecified: Secondary | ICD-10-CM | POA: Diagnosis not present

## 2021-02-14 DIAGNOSIS — D500 Iron deficiency anemia secondary to blood loss (chronic): Secondary | ICD-10-CM

## 2021-02-14 LAB — CBC WITH DIFFERENTIAL (CANCER CENTER ONLY)
Abs Immature Granulocytes: 0.02 10*3/uL (ref 0.00–0.07)
Basophils Absolute: 0.1 10*3/uL (ref 0.0–0.1)
Basophils Relative: 2 %
Eosinophils Absolute: 0.2 10*3/uL (ref 0.0–0.5)
Eosinophils Relative: 2 %
HCT: 38.4 % (ref 36.0–46.0)
Hemoglobin: 11.1 g/dL — ABNORMAL LOW (ref 12.0–15.0)
Immature Granulocytes: 0 %
Lymphocytes Relative: 13 %
Lymphs Abs: 1 10*3/uL (ref 0.7–4.0)
MCH: 20.8 pg — ABNORMAL LOW (ref 26.0–34.0)
MCHC: 28.9 g/dL — ABNORMAL LOW (ref 30.0–36.0)
MCV: 72 fL — ABNORMAL LOW (ref 80.0–100.0)
Monocytes Absolute: 1 10*3/uL (ref 0.1–1.0)
Monocytes Relative: 13 %
Neutro Abs: 5.4 10*3/uL (ref 1.7–7.7)
Neutrophils Relative %: 70 %
Platelet Count: 282 10*3/uL (ref 150–400)
RBC: 5.33 MIL/uL — ABNORMAL HIGH (ref 3.87–5.11)
RDW: 28.9 % — ABNORMAL HIGH (ref 11.5–15.5)
WBC Count: 7.7 10*3/uL (ref 4.0–10.5)
nRBC: 0 % (ref 0.0–0.2)

## 2021-02-14 LAB — FERRITIN: Ferritin: 108 ng/mL (ref 11–307)

## 2021-02-14 LAB — IRON AND TIBC
Iron: 52 ug/dL (ref 41–142)
Saturation Ratios: 14 % — ABNORMAL LOW (ref 21–57)
TIBC: 381 ug/dL (ref 236–444)
UIBC: 329 ug/dL (ref 120–384)

## 2021-02-14 MED ORDER — LORATADINE 10 MG PO TABS
10.0000 mg | ORAL_TABLET | Freq: Every day | ORAL | Status: DC
  Administered 2021-02-14: 10 mg via ORAL

## 2021-02-14 MED ORDER — LORATADINE 10 MG PO TABS
ORAL_TABLET | ORAL | Status: AC
  Filled 2021-02-14: qty 1

## 2021-02-14 MED ORDER — SODIUM CHLORIDE 0.9 % IV SOLN
400.0000 mg | Freq: Once | INTRAVENOUS | Status: AC
  Administered 2021-02-14: 400 mg via INTRAVENOUS
  Filled 2021-02-14: qty 20

## 2021-02-14 MED ORDER — SODIUM CHLORIDE 0.9 % IV SOLN: INTRAVENOUS | Status: DC

## 2021-02-14 NOTE — Patient Instructions (Signed)

## 2021-02-15 ENCOUNTER — Encounter: Payer: Self-pay | Admitting: Hematology

## 2021-02-16 LAB — CELIAC PANEL 10
Antigliadin Abs, IgA: 2 units (ref 0–19)
Endomysial Ab, IgA: NEGATIVE
Gliadin IgG: 1 units (ref 0–19)
IgA: 190 mg/dL (ref 87–352)
Tissue Transglut Ab: <2 U/mL (ref 0–5)
Tissue Transglutaminase Ab, IgA: <2 U/mL (ref 0–3)

## 2021-02-17 ENCOUNTER — Encounter: Payer: Self-pay | Admitting: Hematology

## 2021-02-19 NOTE — Telephone Encounter (Signed)
RB please advise. Thanks  Hi I hope you are well. Thank you for meeting with me and explaining everything.  I have to ask why is THC a in my notes / report.  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  Can you please remove those references to [facility] from my notes.  Thank you.

## 2021-02-20 NOTE — Telephone Encounter (Signed)
Please let her know that, while I wanted to make note of it initially for completeness, I really don't believe that remote [facility] use has any impact on her current issues. I addended her notes and removed mention of her past use as she requested. Thanks

## 2021-02-21 DIAGNOSIS — F411 Generalized anxiety disorder: Secondary | ICD-10-CM | POA: Diagnosis not present

## 2021-02-21 DIAGNOSIS — F3132 Bipolar disorder, current episode depressed, moderate: Secondary | ICD-10-CM | POA: Diagnosis not present

## 2021-02-26 DIAGNOSIS — F332 Major depressive disorder, recurrent severe without psychotic features: Secondary | ICD-10-CM | POA: Diagnosis not present

## 2021-02-26 DIAGNOSIS — F1020 Alcohol dependence, uncomplicated: Secondary | ICD-10-CM | POA: Diagnosis not present

## 2021-02-26 DIAGNOSIS — F319 Bipolar disorder, unspecified: Secondary | ICD-10-CM | POA: Diagnosis not present

## 2021-02-27 ENCOUNTER — Encounter: Payer: Self-pay | Admitting: Gastroenterology

## 2021-02-27 ENCOUNTER — Ambulatory Visit: Payer: BC Managed Care – PPO | Admitting: Gastroenterology

## 2021-02-27 VITALS — BP 110/70 | HR 97 | Ht 70.0 in | Wt 174.0 lb

## 2021-02-27 DIAGNOSIS — D500 Iron deficiency anemia secondary to blood loss (chronic): Secondary | ICD-10-CM

## 2021-02-27 DIAGNOSIS — F332 Major depressive disorder, recurrent severe without psychotic features: Secondary | ICD-10-CM | POA: Diagnosis not present

## 2021-02-27 DIAGNOSIS — K529 Noninfective gastroenteritis and colitis, unspecified: Secondary | ICD-10-CM | POA: Diagnosis not present

## 2021-02-27 MED ORDER — ONDANSETRON HCL 4 MG PO TABS: ORAL_TABLET | ORAL | 0 refills | Status: DC

## 2021-02-27 MED ORDER — PLENVU 140 G PO SOLR: 140.0000 g | ORAL | 0 refills | Status: DC

## 2021-02-27 NOTE — Progress Notes (Signed)
[facility] Consult Note:  History: Amanda Murray 02/27/2021  Referring provider: Biagio Borg, MD  Reason for consult/chief complaint: Diarrhea (Onset 1 to 1.5 years, 3 iron infusions recently, mostly diarrhea in the AM-3 to 4 times, only a tiny amount of blood on the toilet paper after going alot )   Subjective  HPI: This is a very pleasant 45 year old woman referred by primary care for iron deficiency anemia and chronic diarrhea.  The diarrhea has been occurring for at least a year, typically causing 3-4 semiformed to loose BMs per day without blood.  She denies greasy/oily or floating stools.  While her weight fluctuates, it has generally been stable overall for at least a year.  Appetite recently increased when PCP prescribed her Klonopin.  She was proud to report that she quit alcohol use in July 2021 after years of abuse and a self-described "alcoholic", continues to smoke and hopes to be able to stop that eventually as well. She had not noticed any clear triggers for the diarrhea in terms of food or any new medicines when it began.  Regarding the anemia, she has longstanding menorrhagia, typically bleeds for 5 days and on first 2 days it could be quite heavy with a need to change tampons and pads every couple of hours.  That has been the case for her many years and she has just come to live with it.  However, she did not realize how the iron deficiency made her feel in terms of fatigue until she recently improved with iron infusions.  Her dyspnea also improved as well.   ROS:  Review of Systems  Constitutional:  Negative for appetite change and unexpected weight change.  HENT:  Negative for mouth sores and voice change.   Eyes:  Negative for pain and redness.  Respiratory:  Positive for shortness of breath. Negative for cough.   Cardiovascular:  Negative for chest pain and palpitations.  Genitourinary:  Negative for dysuria and hematuria.  Musculoskeletal:   Negative for arthralgias and myalgias.  Skin:  Negative for pallor and rash.  Neurological:  Negative for weakness and headaches.  Hematological:  Negative for adenopathy.  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   UTI (lower urinary tract infection)      Past Surgical History: Past Surgical History:  Procedure Laterality Date   BRONCHIAL BIOPSY  12/25/2020   Procedure: BRONCHIAL BIOPSIES;  Surgeon: Collene Gobble, MD;  Location: [facility];  Service: Pulmonary;;   BRONCHIAL NEEDLE ASPIRATION BIOPSY  12/25/2020   Procedure: BRONCHIAL NEEDLE ASPIRATION BIOPSIES;  Surgeon: Collene Gobble, MD;  Location: [facility];  Service: Pulmonary;;   VIDEO BRONCHOSCOPY WITH ENDOBRONCHIAL NAVIGATION N/A 12/25/2020   Procedure: ROBOTIC BRONCHOSCOPY WITH ENDOBRONCHIAL NAVIGATION;  Surgeon: Collene Gobble, MD;  Location: [facility];  Service: Pulmonary;  Laterality: N/A;   VIDEO BRONCHOSCOPY WITH RADIAL ENDOBRONCHIAL ULTRASOUND  12/25/2020   Procedure: RADIAL ENDOBRONCHIAL ULTRASOUND;  Surgeon: Collene Gobble, MD;  Location: [facility];  Service: Pulmonary;;   WRIST SURGERY Right    Recent pulmonary clinic note and testing for an endobronchial pulmonary nodule noted.  Pathology showed carcinoid tumor.  Dr. Agustina Caroli note indicates it will be followed clinically and radiographically with no clear indication for surgical referral at this time.  PFTs at 02/06/2021 pulmonary office visit showed normal airflow without a bronchodilator response, hyperinflation based on elevated TLC and RV, normal diffusion capacity.  Family History: Family History  Problem Relation Age of Onset   Alcohol abuse Mother    Depression Mother     Drug abuse Mother    Early death Mother    Miscarriages / [place] Mother    Arthritis Father    Drug abuse Father    Early death Father    Kidney disease Father    Liver disease Father    Arthritis Maternal Grandmother    Hearing loss Maternal Grandmother    Varicose Veins Maternal Grandmother    Diabetes Maternal Grandfather    Heart disease Maternal Grandfather    Diabetes Paternal Grandmother    Heart disease Paternal Grandmother    Diabetes Paternal Grandfather    Heart disease Paternal Grandfather    Alcohol abuse Other    Mental illness Other    Sudden death Other    Hypertension Other    Diabetes Other     Social History: Social History   Socioeconomic History   Marital status: Single    Spouse name: Not on file   Number of children: 0   Years of education: college   Highest education level: Not on file  Occupational History   Occupation: Hair Stylist  Tobacco Use   Smoking status: Every Day    Packs/day: 1.00    Years: 30.00    Pack years: 30.00    Types: Cigarettes   Smokeless tobacco: Never   Tobacco comments:    1 pack daily smoked [facility]J 02/06/21  Vaping Use   Vaping Use: Never used  Substance and Sexual Activity   Alcohol use: Not Currently    Comment: Sober Date December 02, 2019 (1 year 1 mo.), 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   Drug use: Not Currently    Frequency: 3.0 times per week    Types: Marijuana    Comment: 12/02/2019   Sexual activity: Not Currently    Birth control/protection: Abstinence, Condom  Other Topics Concern   Not on file  Social History Narrative   Not on file   Social Determinants of Health   Financial Resource Strain: Not on file  Food Insecurity: Not on file  Transportation Needs: Not on file  Physical Activity: Not on file  Stress: Not on file  Social Connections: Not on file    Allergies: Allergies  Allergen Reactions   Morphine And Related Nausea And Vomiting    Outpatient  Meds: Current Outpatient Medications  Medication Sig Dispense Refill   amphetamine-dextroamphetamine (ADDERALL XR) 20 MG 24 hr capsule Take 20 mg by mouth daily.     busPIRone (BUSPAR) 10 MG tablet Take 10 mg by mouth 3 (three) times daily.     Cholecalciferol (VITAMIN D-3) 25 MCG (1000 UT) CAPS Take 1,000 Units by mouth daily. Soft gel     clonazePAM (KLONOPIN) 0.5 MG tablet Take 0.5 mg by mouth 2 (two) times daily as needed for anxiety.     Cyanocobalamin (VITAMIN B-12 PO) Take 5 mLs by mouth daily. 5000 mcg/ 5 ml     folic acid (FOLVITE) 1 MG tablet Take 1 mg by mouth daily.     gabapentin (NEURONTIN) 800 MG tablet Take 800 mg by mouth 3 (three) times  daily.     lamoTRIgine (LAMICTAL) 25 MG tablet Take 25 mg by mouth at bedtime.     mupirocin nasal ointment (BACTROBAN) 2 % Place 1 application into the nose 2 (two) times daily. Use one-half of tube in each nostril twice daily for five (5) days. After application, press sides of nose together and gently massage. 10 g 0   PEG-KCl-NaCl-NaSulf-Na Asc-C (PLENVU) 140 g SOLR Take 140 g by mouth as directed. Manufacturer's coupon Universal coupon code:BIN: P2366821; GROUP: [id number]; PCN: [id number]; ID: [id number]; PAY NO MORE $50 1 each 0   traZODone (DESYREL) 50 MG tablet Take 50 mg by mouth at bedtime as needed for sleep.     valACYclovir (VALTREX) 1000 MG tablet Take 1 tablet (1,000 mg total) by mouth daily as needed (Cold Sore).     venlafaxine XR (EFFEXOR-XR) 150 MG 24 hr capsule Take 150 mg by mouth See admin instructions. Take with 75 mg for at total of 225 mg in the morning     venlafaxine XR (EFFEXOR-XR) 75 MG 24 hr capsule Take 75 mg by mouth See admin instructions. Take with 150 mg for a total of 225 mg daily     ondansetron (ZOFRAN) 4 MG tablet Take 1 tab 30 min before each prep 2 tablet 0   No current facility-administered medications for this visit.      ___________________________________________________________________ Objective    Exam:  BP 110/70   Pulse 97   Ht 5\' 10"  (1.778 m)   Wt 174 lb (78.9 kg)   BMI 24.97 kg/m  Wt Readings from Last 3 Encounters:  02/27/21 174 lb (78.9 kg)  02/06/21 167 lb 12.8 oz (76.1 kg)  01/29/21 166 lb 3.2 oz (75.4 kg)  Recent weight increase noted above  General: Well-appearing, pleasant and conversational.  Breathing comfortably on room air. Eyes: sclera anicteric, no redness ENT: oral mucosa moist without lesions, no cervical or supraclavicular lymphadenopathy CV: RRR without murmur, S1/S2, no JVD, no peripheral edema Resp: clear to auscultation bilaterally, normal RR and effort noted GI: soft, no tenderness, with active bowel sounds. No guarding or palpable organomegaly noted. Skin; warm and dry, no rash or jaundice noted Neuro: awake, alert and oriented x 3. Normal gross motor function and fluent speech  Labs:  CBC Latest Ref Rng & Units 02/14/2021 12/25/2020 11/23/2020  WBC 4.0 - 10.5 K/uL 7.7 5.6 6.6  Hemoglobin 12.0 - 15.0 g/dL 11.1(L) 9.3(L) 10.1(L)  Hematocrit 36.0 - 46.0 % 38.4 32.1(L) 32.0(L)  Platelets 150 - 400 K/uL 282 311 286.0   CMP Latest Ref Rng & Units 11/23/2020 03/09/2020 02/09/2019  Glucose 70 - 99 mg/dL 104(H) 88 91  BUN 6 - 23 mg/dL 8 12 16   Creatinine 0.40 - 1.20 mg/dL 0.62 0.71 0.79  Sodium 135 - 145 mEq/L 139 137 136  Potassium 3.5 - 5.1 mEq/L 3.8 4.0 4.2  Chloride 96 - 112 mEq/L 107 105 104  CO2 19 - 32 mEq/L 24 28 25   Calcium 8.4 - 10.5 mg/dL 8.8 8.8 9.6  Total Protein 6.0 - 8.3 g/dL 7.2 6.9 7.4  Total Bilirubin 0.2 - 1.2 mg/dL 0.3 0.2 0.3  Alkaline Phos 39 - 117 U/L 79 66 67  AST 0 - 37 U/L 18 18 15   ALT 0 - 35 U/L 21 25 14    On 02/09/2019: Iron level 39, transferrin 317, 8.8% saturation  On 01/09/2021, iron 11, TIBC 486 (2% saturation), ferritin 3  On 02/14/2021, iron 52, TIBC 381, ferritin 108  Assessment: Encounter Diagnoses  Name Primary?   Chronic diarrhea Yes   Iron deficiency anemia due to chronic blood loss     IDA  seems most likely to be from menstrual blood loss, though must consider source of GI blood loss or condition causing impaired iron absorption such as celiac sprue. No weight loss to suggest generalized malabsorption.  No history of pancreatitis despite previous alcohol abuse, no chronic abdominal pain, and description of bowel movements I will makes EPI seem unlikely.  No clear risk factors for SIBO.  Hemoglobin and iron levels definitely improved lately with iron infusion.  Plan:  Upper endoscopy and colonoscopy.  She was agreeable after discussion of procedure and risks.  The benefits and risks of the planned procedure were described in detail with the patient or (when appropriate) their health care proxy.  Risks were outlined as including, but not limited to, bleeding, infection, perforation, adverse medication reaction leading to cardiac or pulmonary decompensation, pancreatitis (if ERCP).  The limitation of incomplete mucosal visualization was also discussed.  No guarantees or warranties were given.  As needed Imodium for diarrhea.  Depending on EGD/colonoscopy findings and biopsy results, possible subsequent trial of antispasmodic therapy.  Thank you for the courtesy of this consult.  Please call me with any questions or concerns.  Nelida Meuse III  CC: Referring provider noted above

## 2021-02-27 NOTE — Patient Instructions (Signed)
If you are age 44 or older, your body mass index should be between 23-30. Your Body mass index is 24.97 kg/m. If this is out of the aforementioned range listed, please consider follow up with your Primary Care Provider.  If you are age 70 or younger, your body mass index should be between 19-25. Your Body mass index is 24.97 kg/m. If this is out of the aformentioned range listed, please consider follow up with your Primary Care Provider.   __________________________________________________________  The [facility] providers would like to encourage you to use [facility] to communicate with providers for non-urgent requests or questions.  Due to long hold times on the telephone, sending your provider a message by [facility] may be a faster and more efficient way to get a response.  Please allow 48 business hours for a response.  Please remember that this is for non-urgent requests.   You have been scheduled for an endoscopy and colonoscopy. Please follow the written instructions given to you at your visit today. Please pick up your prep supplies at the pharmacy within the next 1-3 days. If you use inhalers (even only as needed), please bring them with you on the day of your procedure.   It was a pleasure to see you today!  Thank you for trusting me with your gastrointestinal care!

## 2021-02-28 ENCOUNTER — Ambulatory Visit: Payer: BC Managed Care – PPO | Admitting: Family Medicine

## 2021-02-28 ENCOUNTER — Other Ambulatory Visit: Payer: Self-pay | Admitting: Internal Medicine

## 2021-02-28 DIAGNOSIS — F332 Major depressive disorder, recurrent severe without psychotic features: Secondary | ICD-10-CM | POA: Diagnosis not present

## 2021-03-01 DIAGNOSIS — F332 Major depressive disorder, recurrent severe without psychotic features: Secondary | ICD-10-CM | POA: Diagnosis not present

## 2021-03-02 DIAGNOSIS — F332 Major depressive disorder, recurrent severe without psychotic features: Secondary | ICD-10-CM | POA: Diagnosis not present

## 2021-03-05 DIAGNOSIS — F319 Bipolar disorder, unspecified: Secondary | ICD-10-CM | POA: Diagnosis not present

## 2021-03-05 DIAGNOSIS — F332 Major depressive disorder, recurrent severe without psychotic features: Secondary | ICD-10-CM | POA: Diagnosis not present

## 2021-03-05 DIAGNOSIS — F1020 Alcohol dependence, uncomplicated: Secondary | ICD-10-CM | POA: Diagnosis not present

## 2021-03-06 DIAGNOSIS — F332 Major depressive disorder, recurrent severe without psychotic features: Secondary | ICD-10-CM | POA: Diagnosis not present

## 2021-03-07 DIAGNOSIS — F332 Major depressive disorder, recurrent severe without psychotic features: Secondary | ICD-10-CM | POA: Diagnosis not present

## 2021-03-08 DIAGNOSIS — F332 Major depressive disorder, recurrent severe without psychotic features: Secondary | ICD-10-CM | POA: Diagnosis not present

## 2021-03-09 ENCOUNTER — Other Ambulatory Visit: Payer: Self-pay | Admitting: Internal Medicine

## 2021-03-12 DIAGNOSIS — F332 Major depressive disorder, recurrent severe without psychotic features: Secondary | ICD-10-CM | POA: Diagnosis not present

## 2021-03-13 DIAGNOSIS — F332 Major depressive disorder, recurrent severe without psychotic features: Secondary | ICD-10-CM | POA: Diagnosis not present

## 2021-03-15 DIAGNOSIS — F332 Major depressive disorder, recurrent severe without psychotic features: Secondary | ICD-10-CM | POA: Diagnosis not present

## 2021-03-16 DIAGNOSIS — F332 Major depressive disorder, recurrent severe without psychotic features: Secondary | ICD-10-CM | POA: Diagnosis not present

## 2021-03-21 ENCOUNTER — Other Ambulatory Visit: Payer: Self-pay

## 2021-03-21 ENCOUNTER — Inpatient Hospital Stay (HOSPITAL_BASED_OUTPATIENT_CLINIC_OR_DEPARTMENT_OTHER): Payer: BC Managed Care – PPO | Admitting: Hematology

## 2021-03-21 ENCOUNTER — Inpatient Hospital Stay: Payer: BC Managed Care – PPO | Attending: Hematology

## 2021-03-21 ENCOUNTER — Encounter: Payer: Self-pay | Admitting: Hematology

## 2021-03-21 VITALS — BP 108/68 | HR 112 | Temp 97.6°F | Resp 18 | Ht 70.0 in | Wt 172.0 lb

## 2021-03-21 DIAGNOSIS — F3132 Bipolar disorder, current episode depressed, moderate: Secondary | ICD-10-CM | POA: Diagnosis not present

## 2021-03-21 DIAGNOSIS — D508 Other iron deficiency anemias: Secondary | ICD-10-CM | POA: Diagnosis not present

## 2021-03-21 DIAGNOSIS — R197 Diarrhea, unspecified: Secondary | ICD-10-CM | POA: Insufficient documentation

## 2021-03-21 DIAGNOSIS — N920 Excessive and frequent menstruation with regular cycle: Secondary | ICD-10-CM | POA: Insufficient documentation

## 2021-03-21 DIAGNOSIS — F411 Generalized anxiety disorder: Secondary | ICD-10-CM | POA: Diagnosis not present

## 2021-03-21 DIAGNOSIS — F332 Major depressive disorder, recurrent severe without psychotic features: Secondary | ICD-10-CM | POA: Diagnosis not present

## 2021-03-21 DIAGNOSIS — D509 Iron deficiency anemia, unspecified: Secondary | ICD-10-CM | POA: Diagnosis not present

## 2021-03-21 LAB — CBC WITH DIFFERENTIAL (CANCER CENTER ONLY)
Abs Immature Granulocytes: 0.03 10*3/uL (ref 0.00–0.07)
Basophils Absolute: 0.1 10*3/uL (ref 0.0–0.1)
Basophils Relative: 2 %
Eosinophils Absolute: 0.2 10*3/uL (ref 0.0–0.5)
Eosinophils Relative: 3 %
HCT: 42.4 % (ref 36.0–46.0)
Hemoglobin: 12.9 g/dL (ref 12.0–15.0)
Immature Granulocytes: 0 %
Lymphocytes Relative: 21 %
Lymphs Abs: 1.6 10*3/uL (ref 0.7–4.0)
MCH: 24.2 pg — ABNORMAL LOW (ref 26.0–34.0)
MCHC: 30.4 g/dL (ref 30.0–36.0)
MCV: 79.7 fL — ABNORMAL LOW (ref 80.0–100.0)
Monocytes Absolute: 0.6 10*3/uL (ref 0.1–1.0)
Monocytes Relative: 8 %
Neutro Abs: 5.2 10*3/uL (ref 1.7–7.7)
Neutrophils Relative %: 66 %
Platelet Count: 271 10*3/uL (ref 150–400)
RBC: 5.32 MIL/uL — ABNORMAL HIGH (ref 3.87–5.11)
WBC Count: 7.7 10*3/uL (ref 4.0–10.5)
nRBC: 0 % (ref 0.0–0.2)

## 2021-03-21 LAB — IRON AND TIBC
Iron: 28 ug/dL — ABNORMAL LOW (ref 41–142)
Saturation Ratios: 9 % — ABNORMAL LOW (ref 21–57)
TIBC: 321 ug/dL (ref 236–444)
UIBC: 293 ug/dL (ref 120–384)

## 2021-03-21 LAB — FERRITIN: Ferritin: 19 ng/mL (ref 11–307)

## 2021-03-21 NOTE — Progress Notes (Signed)
[place]   [phone] [phone]   Clinic Follow up Note   Patient Care Team: Biagio Borg, MD as PCP - General (Internal Medicine) Gregor Hams, MD as Consulting Physician (Family Medicine) Truitt Merle, MD as Consulting Physician (Hematology)  Date of Service:  03/21/2021  CHIEF COMPLAINT: f/u of iron deficient anemia  CURRENT THERAPY:  IV Venofer as needed, last 02/14/21  ASSESSMENT & PLAN:  Amanda Murray is a 45 y.o. female with   1. Iron Deficient Anemia -She has worsening anemia since October 2021.  CBC on 12/25/20 showed hemoglobin 9.3 with MCV 67.9.. Further work up showed iron 11, sat 2%, and ferritin 3 (01/09/21), which is consistent with iron deficient anemia. -has history of heavy periods, and more recently epistaxis. She also has fatigue, SOB, joint pain, and diarrhea.  -I discussed her iron deficiency is likely from menorrhagia, may be related to her chronic diarrhea also. She is scheduled for endo/colonoscopy on 03/30/21 with Dr. Loletha Carrow. -labs reviewed, her hgb is now WNL. Her iron panel from today is pending, but last result after two doses of IV iron (02/14/21) showed improvement to her ferritin and iron. -she initially experienced an excellent response to IV iron. More recently, she has started to feel badly again. She does note that she is experiencing increased anxiety and stress lately, more so than before the iron. We will monitor her labs, and I will give IV iron based on her symptoms as well as her labs. -she will return for repeat labs monthly and f/u in three months.   2. Neuroendocrine tumor of LUL -found on work up for SOB. Chest CT showed 1.1 cm LUL nodule, possibly endobronchial. -bronchoscopy on 12/25/20 by Dr. Lamonte Sakai showed neuroendocrine tumor -f/u with Dr. Lamonte Sakai next week      PLAN: -lab monthly x3 -f/u in 3 months -IV Venofer 400mg  1-3 doses if ferritin <50   No problem-specific Assessment & Plan notes found for  this encounter.   INTERVAL HISTORY:  Amanda Murray is here for a follow up of anemia. She was last seen by me on 01/18/21. She presents to the clinic alone. She reports she initially felt much better after the first two IV iron doses. She reports now she feels like things are going back to how they were, but she also notes she is experiencing increased anxiety and depression lately.    All other systems were reviewed with the patient and are negative.  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   UTI (lower urinary tract infection)     SURGICAL HISTORY: Past Surgical History:  Procedure Laterality Date   BRONCHIAL BIOPSY  12/25/2020   Procedure: BRONCHIAL BIOPSIES;  Surgeon: Collene Gobble, MD;  Location: [facility];  Service: Pulmonary;;   BRONCHIAL NEEDLE ASPIRATION BIOPSY  12/25/2020   Procedure: BRONCHIAL NEEDLE ASPIRATION BIOPSIES;  Surgeon: Collene Gobble, MD;  Location: [facility];  Service: Pulmonary;;   VIDEO BRONCHOSCOPY WITH ENDOBRONCHIAL NAVIGATION N/A 12/25/2020   Procedure: ROBOTIC BRONCHOSCOPY WITH ENDOBRONCHIAL NAVIGATION;  Surgeon: Collene Gobble, MD;  Location: [facility];  Service: Pulmonary;  Laterality: N/A;   VIDEO BRONCHOSCOPY WITH RADIAL ENDOBRONCHIAL ULTRASOUND  12/25/2020   Procedure: RADIAL ENDOBRONCHIAL ULTRASOUND;  Surgeon: Baltazar Apo  S, MD;  Location: [facility];  Service: Pulmonary;;   WRIST SURGERY Right     I have reviewed the social history and family history with the patient and they are unchanged from previous note.  ALLERGIES:  is allergic to morphine and related.  MEDICATIONS:  Current Outpatient Medications  Medication Sig Dispense Refill   amphetamine-dextroamphetamine (ADDERALL XR) 20 MG 24 hr capsule Take 20 mg by mouth daily.      busPIRone (BUSPAR) 10 MG tablet Take 10 mg by mouth 3 (three) times daily.     Cholecalciferol (VITAMIN D-3) 25 MCG (1000 UT) CAPS Take 1,000 Units by mouth daily. Soft gel     clonazePAM (KLONOPIN) 0.5 MG tablet Take 0.5 mg by mouth 2 (two) times daily as needed for anxiety.     Cyanocobalamin (VITAMIN B-12 PO) Take 5 mLs by mouth daily. 5000 mcg/ 5 ml     folic acid (FOLVITE) 1 MG tablet Take 1 mg by mouth daily.     gabapentin (NEURONTIN) 800 MG tablet Take 800 mg by mouth 3 (three) times daily.     lamoTRIgine (LAMICTAL) 25 MG tablet Take 25 mg by mouth at bedtime.     mupirocin nasal ointment (BACTROBAN) 2 % Place 1 application into the nose 2 (two) times daily. Use one-half of tube in each nostril twice daily for five (5) days. After application, press sides of nose together and gently massage. 10 g 0   mupirocin ointment (BACTROBAN) 2 % APPLY SMALL AMOUNT IN EACH NOSTRIL TWICE DAILY FOR 5 DAYS 22 g 0   ondansetron (ZOFRAN) 4 MG tablet Take 1 tab 30 min before each prep 2 tablet 0   PEG-KCl-NaCl-NaSulf-Na Asc-C (PLENVU) 140 g SOLR Take 140 g by mouth as directed. Manufacturer's coupon Universal coupon code:BIN: P2366821; GROUP: [id number]; PCN: [id number]; ID: [id number]; PAY NO MORE $50 1 each 0   traZODone (DESYREL) 50 MG tablet Take 50 mg by mouth at bedtime as needed for sleep.     valACYclovir (VALTREX) 1000 MG tablet TAKE 1 TABLET(1000 MG) BY MOUTH TWICE DAILY 14 tablet 0   venlafaxine XR (EFFEXOR-XR) 150 MG 24 hr capsule Take 150 mg by mouth See admin instructions. Take with 75 mg for at total of 225 mg in the morning     venlafaxine XR (EFFEXOR-XR) 75 MG 24 hr capsule Take 75 mg by mouth See admin instructions. Take with 150 mg for a total of 225 mg daily     No current facility-administered medications for this visit.    PHYSICAL EXAMINATION: ECOG PERFORMANCE STATUS: 1 - Symptomatic but completely ambulatory  Vitals:   03/21/21 1202  BP: 108/68  Pulse: (!) 112  Resp: 18  Temp: 97.6  F (36.4 C)  SpO2: 100%   Wt Readings from Last 3 Encounters:  03/21/21 172 lb (78 kg)  02/27/21 174 lb (78.9 kg)  02/06/21 167 lb 12.8 oz (76.1 kg)     GENERAL:alert, no distress and comfortable SKIN: skin color normal, no rashes or significant lesions EYES: normal, Conjunctiva are pink and non-injected, sclera clear  NEURO: alert & oriented x 3 with fluent speech  LABORATORY DATA:  I have reviewed the data as listed CBC Latest Ref Rng & Units 03/21/2021 02/14/2021 12/25/2020  WBC 4.0 - 10.5 K/uL 7.7 7.7 5.6  Hemoglobin 12.0 - 15.0 g/dL 12.9 11.1(L) 9.3(L)  Hematocrit 36.0 - 46.0 % 42.4 38.4 32.1(L)  Platelets 150 - 400 K/uL 271 282 311     CMP Latest Ref  Rng & Units 11/23/2020 03/09/2020 02/09/2019  Glucose 70 - 99 mg/dL 104(H) 88 91  BUN 6 - 23 mg/dL 8 12 16   Creatinine 0.40 - 1.20 mg/dL 0.62 0.71 0.79  Sodium 135 - 145 mEq/L 139 137 136  Potassium 3.5 - 5.1 mEq/L 3.8 4.0 4.2  Chloride 96 - 112 mEq/L 107 105 104  CO2 19 - 32 mEq/L 24 28 25   Calcium 8.4 - 10.5 mg/dL 8.8 8.8 9.6  Total Protein 6.0 - 8.3 g/dL 7.2 6.9 7.4  Total Bilirubin 0.2 - 1.2 mg/dL 0.3 0.2 0.3  Alkaline Phos 39 - 117 U/L 79 66 67  AST 0 - 37 U/L 18 18 15   ALT 0 - 35 U/L 21 25 14       RADIOGRAPHIC STUDIES: I have personally reviewed the radiological images as listed and agreed with the findings in the report. No results found.    No orders of the defined types were placed in this encounter.  All questions were answered. The patient knows to call the clinic with any problems, questions or concerns. No barriers to learning was detected. The total time spent in the appointment was 25 minutes.     Truitt Merle, MD 03/21/2021   I, Wilburn Mylar, am acting as scribe for Truitt Merle, MD.   I have reviewed the above documentation for accuracy and completeness, and I agree with the above.

## 2021-03-22 DIAGNOSIS — F332 Major depressive disorder, recurrent severe without psychotic features: Secondary | ICD-10-CM | POA: Diagnosis not present

## 2021-03-23 ENCOUNTER — Encounter: Payer: Self-pay | Admitting: Hematology

## 2021-03-23 DIAGNOSIS — F332 Major depressive disorder, recurrent severe without psychotic features: Secondary | ICD-10-CM | POA: Diagnosis not present

## 2021-03-26 DIAGNOSIS — F332 Major depressive disorder, recurrent severe without psychotic features: Secondary | ICD-10-CM | POA: Diagnosis not present

## 2021-03-27 DIAGNOSIS — F332 Major depressive disorder, recurrent severe without psychotic features: Secondary | ICD-10-CM | POA: Diagnosis not present

## 2021-03-28 DIAGNOSIS — F332 Major depressive disorder, recurrent severe without psychotic features: Secondary | ICD-10-CM | POA: Diagnosis not present

## 2021-03-29 DIAGNOSIS — F332 Major depressive disorder, recurrent severe without psychotic features: Secondary | ICD-10-CM | POA: Diagnosis not present

## 2021-03-30 ENCOUNTER — Encounter: Payer: Self-pay | Admitting: Gastroenterology

## 2021-03-30 ENCOUNTER — Ambulatory Visit (AMBULATORY_SURGERY_CENTER): Payer: BC Managed Care – PPO | Admitting: Gastroenterology

## 2021-03-30 ENCOUNTER — Other Ambulatory Visit: Payer: Self-pay

## 2021-03-30 VITALS — BP 125/78 | HR 71 | Temp 96.8°F | Resp 16 | Ht 70.0 in | Wt 174.0 lb

## 2021-03-30 DIAGNOSIS — D500 Iron deficiency anemia secondary to blood loss (chronic): Secondary | ICD-10-CM

## 2021-03-30 DIAGNOSIS — K648 Other hemorrhoids: Secondary | ICD-10-CM

## 2021-03-30 DIAGNOSIS — K529 Noninfective gastroenteritis and colitis, unspecified: Secondary | ICD-10-CM

## 2021-03-30 MED ORDER — SODIUM CHLORIDE 0.9 % IV SOLN: 500.0000 mL | Freq: Once | INTRAVENOUS | Status: DC

## 2021-03-30 MED FILL — Iron Sucrose Inj 20 MG/ML (Fe Equiv): INTRAVENOUS | Qty: 20 | Status: AC

## 2021-03-30 NOTE — Patient Instructions (Signed)
YOU HAD AN ENDOSCOPIC PROCEDURE TODAY AT THE [facility]:   Refer to the procedure report that was given to you for any specific questions about what was found during the examination.  If the procedure report does not answer your questions, please call your gastroenterologist to clarify.  If you requested that your care partner not be given the details of your procedure findings, then the procedure report has been included in a sealed envelope for you to review at your convenience later.  YOU SHOULD EXPECT: Some feelings of bloating in the abdomen. Passage of more gas than usual.  Walking can help get rid of the air that was put into your GI tract during the procedure and reduce the bloating. If you had a lower endoscopy (such as a colonoscopy or flexible sigmoidoscopy) you may notice spotting of blood in your stool or on the toilet paper. If you underwent a bowel prep for your procedure, you may not have a normal bowel movement for a few days.  Please Note:  You might notice some irritation and congestion in your nose or some drainage.  This is from the oxygen used during your procedure.  There is no need for concern and it should clear up in a day or so.  SYMPTOMS TO REPORT IMMEDIATELY:   Following lower endoscopy (colonoscopy or flexible sigmoidoscopy):  Excessive amounts of blood in the stool  Significant tenderness or worsening of abdominal pains  Swelling of the abdomen that is new, acute  Fever of 100F or higher   Following upper endoscopy (EGD)  Vomiting of blood or coffee ground material  New chest pain or pain under the shoulder blades  Painful or persistently difficult swallowing  New shortness of breath  Fever of 100F or higher  Black, tarry-looking stools  For urgent or emergent issues, a gastroenterologist can be reached at any hour by calling [phone]. Do not use MyChart messaging for urgent concerns.    DIET:  We do recommend a small meal at first, but  then you may proceed to your regular diet.  Drink plenty of fluids but you should avoid alcoholic beverages for 24 hours.  ACTIVITY:  You should plan to take it easy for the rest of today and you should NOT DRIVE or use heavy machinery until tomorrow (because of the sedation medicines used during the test).    FOLLOW UP: Our staff will call the number listed on your records 48-72 hours following your procedure to check on you and address any questions or concerns that you may have regarding the information given to you following your procedure. If we do not reach you, we will leave a message.  We will attempt to reach you two times.  During this call, we will ask if you have developed any symptoms of COVID 19. If you develop any symptoms (ie: fever, flu-like symptoms, shortness of breath, cough etc.) before then, please call [phone].  If you test positive for Covid 19 in the 2 weeks post procedure, please call and report this information to us.    If any biopsies were taken you will be contacted by phone or by letter within the next 1-3 weeks.  Please call us at [phone] if you have not heard about the biopsies in 3 weeks.    SIGNATURES/CONFIDENTIALITY: You and/or your care partner have signed paperwork which will be entered into your electronic medical record.  These signatures attest to the fact that that the information above on   your After Visit Summary has been reviewed and is understood.  Full responsibility of the confidentiality of this discharge information lies with you and/or your care-partner. 

## 2021-03-30 NOTE — Progress Notes (Signed)
History and Physical:  This patient presents for endoscopic testing for: Encounter Diagnoses  Name Primary?   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   UTI (lower urinary tract infection)      Past Surgical History: Past Surgical History:  Procedure Laterality Date   BRONCHIAL BIOPSY  12/25/2020   Procedure: BRONCHIAL BIOPSIES;  Surgeon: Collene Gobble, MD;  Location: [facility];  Service: Pulmonary;;   BRONCHIAL NEEDLE ASPIRATION BIOPSY  12/25/2020   Procedure: BRONCHIAL NEEDLE ASPIRATION BIOPSIES;  Surgeon: Collene Gobble, MD;  Location: [facility];  Service: Pulmonary;;   COLONOSCOPY     UPPER GASTROINTESTINAL ENDOSCOPY     VIDEO BRONCHOSCOPY WITH ENDOBRONCHIAL NAVIGATION N/A 12/25/2020   Procedure: ROBOTIC BRONCHOSCOPY WITH ENDOBRONCHIAL NAVIGATION;  Surgeon: Collene Gobble, MD;  Location: [facility];  Service: Pulmonary;  Laterality: N/A;   VIDEO BRONCHOSCOPY WITH RADIAL ENDOBRONCHIAL ULTRASOUND  12/25/2020   Procedure: RADIAL ENDOBRONCHIAL ULTRASOUND;  Surgeon: Collene Gobble, MD;  Location: [facility];  Service: Pulmonary;;   WRIST SURGERY Right     Allergies: Allergies  Allergen Reactions   Doxycycline Other (See Comments)   Morphine And Related Nausea And Vomiting    Outpatient Meds: Current Outpatient Medications  Medication Sig Dispense Refill   amphetamine-dextroamphetamine (ADDERALL XR) 20 MG 24 hr capsule Take 20 mg by mouth daily.     busPIRone (BUSPAR) 10 MG tablet Take 10 mg by mouth 3 (three) times daily.     Cholecalciferol (VITAMIN D-3) 25 MCG (1000 UT)  CAPS Take 1,000 Units by mouth daily. Soft gel     clonazePAM (KLONOPIN) 0.5 MG tablet Take 0.5 mg by mouth 2 (two) times daily as needed for anxiety.     Cyanocobalamin (VITAMIN B-12 PO) Take 5 mLs by mouth daily. 5000 mcg/ 5 ml     folic acid (FOLVITE) 1 MG tablet Take 1 mg by mouth daily.     gabapentin (NEURONTIN) 800 MG tablet Take 800 mg by mouth 3 (three) times daily.     lamoTRIgine (LAMICTAL) 25 MG tablet Take 25 mg by mouth at bedtime.     mupirocin nasal ointment (BACTROBAN) 2 % Place 1 application into the nose 2 (two) times daily. Use one-half of tube in each nostril twice daily for five (5) days. After application, press sides of nose together and gently massage. 10 g 0   mupirocin ointment (BACTROBAN) 2 % APPLY SMALL AMOUNT IN EACH NOSTRIL TWICE DAILY FOR 5 DAYS 22 g 0   ondansetron (ZOFRAN) 4 MG tablet Take 1 tab 30 min before each prep 2 tablet 0   traZODone (DESYREL) 50 MG tablet Take 50 mg by mouth at bedtime as needed for sleep.     venlafaxine XR (EFFEXOR-XR) 150 MG 24 hr capsule Take 150 mg by mouth See admin instructions. Take with 75 mg for at total of 225 mg in the morning     venlafaxine XR (EFFEXOR-XR) 75 MG 24 hr capsule Take 75 mg by mouth See admin instructions. Take with 150 mg for a total  of 225 mg daily     valACYclovir (VALTREX) 1000 MG tablet TAKE 1 TABLET(1000 MG) BY MOUTH TWICE DAILY 14 tablet 0   Current Facility-Administered Medications  Medication Dose Route Frequency Provider Last Rate Last Admin   0.9 %  sodium chloride infusion  500 mL Intravenous Once Danis, Estill Cotta III, MD          ___________________________________________________________________ Objective   Exam:  BP (!) 132/93   Pulse 77   Temp (!) 96.8 F (36 C) (Temporal)   Resp 11   Ht 5\' 10"  (1.778 m)   Wt 174 lb (78.9 kg)   SpO2 100%   BMI 24.97 kg/m   CV: RRR without murmur, S1/S2 Resp: clear to auscultation bilaterally, normal RR and effort noted GI: soft, no tenderness,  with active bowel sounds.   Assessment: Encounter Diagnoses  Name Primary?   Chronic diarrhea Yes   Iron deficiency anemia due to chronic blood loss      Plan: Colonoscopy EGD   The patient is appropriate for an endoscopic procedure in the ambulatory setting.   - Wilfrid Lund, MD

## 2021-03-30 NOTE — Op Note (Signed)
[place] Patient Name: Amanda Murray Procedure Date: 03/30/2021 2:55 PM MRN: [id number] Endoscopist: Mallie Mussel L. Loletha Carrow , MD Age: 45 Referring MD:  Date of Birth: Feb 01, 1976 Gender: Female Account #: [id number] Procedure:                Colonoscopy Indications:              Chronic diarrhea, Iron deficiency anemia secondary                            to chronic blood loss Medicines:                Monitored Anesthesia Care Procedure:                Pre-Anesthesia Assessment:                           - Prior to the procedure, a History and Physical                            was performed, and patient medications and                            allergies were reviewed. The patient's tolerance of                            previous anesthesia was also reviewed. The risks                            and benefits of the procedure and the sedation                            options and risks were discussed with the patient.                            All questions were answered, and informed consent                            was obtained. Prior Anticoagulants: The patient has                            taken no previous anticoagulant or antiplatelet                            agents. ASA Grade Assessment: II - A patient with                            mild systemic disease. After reviewing the risks                            and benefits, the patient was deemed in                            satisfactory condition to undergo the procedure.  After obtaining informed consent, the colonoscope                            was passed under direct vision. Throughout the                            procedure, the patient's blood pressure, pulse, and                            oxygen saturations were monitored continuously. The                            #[id number] [place] was introduced through                            the anus and advanced to the the  terminal ileum,                            with identification of the appendiceal orifice and                            IC valve. The colonoscopy was performed without                            difficulty. The patient tolerated the procedure                            well. The quality of the bowel preparation was                            excellent. The terminal ileum, ileocecal valve,                            appendiceal orifice, and rectum were photographed. Scope In: 3:10:41 PM Scope Out: 3:24:38 PM Scope Withdrawal Time: 0 hours 9 minutes 45 seconds  Total Procedure Duration: 0 hours 13 minutes 57 seconds  Findings:                 The perianal and digital rectal examinations were                            normal.                           The terminal ileum appeared normal.                           Normal mucosa was found in the entire colon.                            Biopsies for histology were taken with a cold                            forceps from the right colon and left colon for  evaluation of microscopic colitis.                           There is no endoscopic evidence of polyps in the                            entire colon.                           Internal hemorrhoids were found. The hemorrhoids                            were small.                           The exam was otherwise without abnormality on                            direct and retroflexion views. Complications:            No immediate complications. Estimated Blood Loss:     Estimated blood loss was minimal. Impression:               - The examined portion of the ileum was normal.                           - Normal mucosa in the entire examined colon.                            Biopsied.                           - Internal hemorrhoids.                           - The examination was otherwise normal on direct                            and retroflexion  views. Recommendation:           - Patient has a contact number available for                            emergencies. The signs and symptoms of potential                            delayed complications were discussed with the                            patient. Return to normal activities tomorrow.                            Written discharge instructions were provided to the                            patient.                           -  Resume previous diet.                           - Continue present medications.                           - Await pathology results.                           - Repeat colonoscopy in 10 years for screening                            purposes.                           - See the other procedure note for documentation of                            additional recommendations. Hatsuko Bizzarro L. Loletha Carrow, MD 03/30/2021 3:38:03 PM This report has been signed electronically.

## 2021-03-30 NOTE — Progress Notes (Signed)
Report given to PACU, vss 

## 2021-03-30 NOTE — Op Note (Signed)
[place] Patient Name: Amanda Murray Procedure Date: 03/30/2021 2:55 PM MRN: [id number] Endoscopist: Mallie Mussel L. Loletha Carrow , MD Age: 45 Referring MD:  Date of Birth: May 24, 1975 Gender: Female Account #: [id number] Procedure:                Upper GI endoscopy Indications:              Iron deficiency anemia secondary to chronic blood                            loss, Diarrhea Medicines:                Monitored Anesthesia Care Procedure:                Pre-Anesthesia Assessment:                           - Prior to the procedure, a History and Physical                            was performed, and patient medications and                            allergies were reviewed. The patient's tolerance of                            previous anesthesia was also reviewed. The risks                            and benefits of the procedure and the sedation                            options and risks were discussed with the patient.                            All questions were answered, and informed consent                            was obtained. Prior Anticoagulants: The patient has                            taken no previous anticoagulant or antiplatelet                            agents. ASA Grade Assessment: II - A patient with                            mild systemic disease. After reviewing the risks                            and benefits, the patient was deemed in                            satisfactory condition to undergo the procedure.  After obtaining informed consent, the endoscope was                            passed under direct vision. Throughout the                            procedure, the patient's blood pressure, pulse, and                            oxygen saturations were monitored continuously. The                            Endoscope was introduced through the mouth, and                            advanced to the second part of  duodenum. Scope In: Scope Out: Findings:                 The esophagus was normal.                           The stomach was normal.                           The cardia and gastric fundus were normal on                            retroflexion.                           The examined duodenum was normal. Biopsies for                            histology were taken with a cold forceps for                            evaluation of celiac disease. Complications:            No immediate complications. Estimated Blood Loss:     Estimated blood loss was minimal. Impression:               - Normal esophagus.                           - Normal stomach.                           - Normal examined duodenum. Biopsied.                           No source of blood loss on either EGD or                            colonoscopy. This patient's IDA appears to be from                            menorrhagia. Recommendation:           -  Patient has a contact number available for                            emergencies. The signs and symptoms of potential                            delayed complications were discussed with the                            patient. Return to normal activities tomorrow.                            Written discharge instructions were provided to the                            patient.                           - Resume previous diet.                           - Continue present medications.                           - Await pathology results.                           - See the other procedure note for documentation of                            additional recommendations. Fonnie Crookshanks L. Loletha Carrow, MD 03/30/2021 3:41:35 PM This report has been signed electronically.

## 2021-03-30 NOTE — Progress Notes (Signed)
Called to room to assist during endoscopic procedure.  Patient ID and intended procedure confirmed with present staff. Received instructions for my participation in the procedure from the performing physician.  

## 2021-03-30 NOTE — Progress Notes (Signed)
Pt's states no medical or surgical changes since previsit or office visit.  V[place] 

## 2021-03-30 NOTE — Progress Notes (Signed)
1505 Robinul 0.1 mg IV given due large amount of secretions upon assessment.  MD made aware, vss 

## 2021-03-31 ENCOUNTER — Inpatient Hospital Stay: Payer: BC Managed Care – PPO

## 2021-03-31 VITALS — BP 103/70 | HR 66 | Temp 97.5°F | Resp 18

## 2021-03-31 DIAGNOSIS — D500 Iron deficiency anemia secondary to blood loss (chronic): Secondary | ICD-10-CM

## 2021-03-31 DIAGNOSIS — N920 Excessive and frequent menstruation with regular cycle: Secondary | ICD-10-CM | POA: Diagnosis not present

## 2021-03-31 DIAGNOSIS — D509 Iron deficiency anemia, unspecified: Secondary | ICD-10-CM | POA: Diagnosis not present

## 2021-03-31 DIAGNOSIS — R197 Diarrhea, unspecified: Secondary | ICD-10-CM | POA: Diagnosis not present

## 2021-03-31 MED ORDER — SODIUM CHLORIDE 0.9 % IV SOLN: Freq: Once | INTRAVENOUS | Status: AC

## 2021-03-31 MED ORDER — SODIUM CHLORIDE 0.9 % IV SOLN
400.0000 mg | Freq: Once | INTRAVENOUS | Status: AC
  Administered 2021-03-31: 400 mg via INTRAVENOUS
  Filled 2021-03-31: qty 20

## 2021-03-31 NOTE — Patient Instructions (Signed)

## 2021-04-03 ENCOUNTER — Telehealth: Payer: Self-pay

## 2021-04-03 NOTE — Telephone Encounter (Signed)
Attempted f/u call. No answer, left VM. 

## 2021-04-03 NOTE — Telephone Encounter (Signed)
  Follow up Call-  Call back number 03/30/2021  Post procedure Call Back phone  # [phone]  Permission to leave phone message Yes  Some recent data might be hidden     Patient questions:  Do you have a fever, pain , or abdominal swelling? No. Pain Score  0 *  Have you tolerated food without any problems? Yes.    Have you been able to return to your normal activities? Yes.    Do you have any questions about your discharge instructions: Diet   No. Medications  No. Follow up visit  No.  Do you have questions or concerns about your Care? No.  Actions: * If pain score is 4 or above: No action needed, pain <4.

## 2021-04-06 ENCOUNTER — Encounter: Payer: Self-pay | Admitting: Gastroenterology

## 2021-04-06 DIAGNOSIS — K529 Noninfective gastroenteritis and colitis, unspecified: Secondary | ICD-10-CM

## 2021-04-06 DIAGNOSIS — F331 Major depressive disorder, recurrent, moderate: Secondary | ICD-10-CM | POA: Diagnosis not present

## 2021-04-06 DIAGNOSIS — F19120 Other psychoactive substance abuse with intoxication, uncomplicated: Secondary | ICD-10-CM | POA: Diagnosis not present

## 2021-04-06 MED FILL — Iron Sucrose Inj 20 MG/ML (Fe Equiv): INTRAVENOUS | Qty: 20 | Status: AC

## 2021-04-07 ENCOUNTER — Other Ambulatory Visit: Payer: Self-pay

## 2021-04-07 ENCOUNTER — Inpatient Hospital Stay: Payer: BC Managed Care – PPO

## 2021-04-07 VITALS — BP 117/87 | HR 75 | Temp 97.8°F | Resp 17

## 2021-04-07 DIAGNOSIS — R197 Diarrhea, unspecified: Secondary | ICD-10-CM | POA: Diagnosis not present

## 2021-04-07 DIAGNOSIS — D500 Iron deficiency anemia secondary to blood loss (chronic): Secondary | ICD-10-CM

## 2021-04-07 DIAGNOSIS — D509 Iron deficiency anemia, unspecified: Secondary | ICD-10-CM | POA: Diagnosis not present

## 2021-04-07 DIAGNOSIS — N920 Excessive and frequent menstruation with regular cycle: Secondary | ICD-10-CM | POA: Diagnosis not present

## 2021-04-07 MED ORDER — IRON SUCROSE 20 MG/ML IV SOLN
400.0000 mg | Freq: Once | INTRAVENOUS | Status: AC
  Administered 2021-04-07: 400 mg via INTRAVENOUS
  Filled 2021-04-07: qty 20

## 2021-04-07 MED ORDER — SODIUM CHLORIDE 0.9 % IV SOLN: Freq: Once | INTRAVENOUS | Status: AC

## 2021-04-10 MED ORDER — HYOSCYAMINE SULFATE 0.125 MG SL SUBL: SUBLINGUAL_TABLET | SUBLINGUAL | 1 refills | Status: DC

## 2021-04-10 NOTE — Telephone Encounter (Signed)
What we know so far from the procedures and the biopsies is that there is no colitis (overt or microscopic) or celiac sprue (inflammatory reaction to gluten).  I think the most likely explanation for symptoms is irritable bowel syndrome, less likely insufficient pancreas enzyme production or small intestine bacterial overgrowth.  My advice is as follows:  Obtain sample for fecal elastase  Trial of hyoscyamine 0.125 mg, 1 tablet 2-3 times a day as needed for diarrhea.  Dispense 45, refill 1  Clinic appointment with me.  HD

## 2021-04-11 DIAGNOSIS — F3132 Bipolar disorder, current episode depressed, moderate: Secondary | ICD-10-CM | POA: Diagnosis not present

## 2021-04-11 DIAGNOSIS — F411 Generalized anxiety disorder: Secondary | ICD-10-CM | POA: Diagnosis not present

## 2021-04-12 DIAGNOSIS — F19120 Other psychoactive substance abuse with intoxication, uncomplicated: Secondary | ICD-10-CM | POA: Diagnosis not present

## 2021-04-12 DIAGNOSIS — F331 Major depressive disorder, recurrent, moderate: Secondary | ICD-10-CM | POA: Diagnosis not present

## 2021-04-17 ENCOUNTER — Telehealth: Payer: Self-pay | Admitting: Hematology

## 2021-04-17 NOTE — Telephone Encounter (Signed)
Patient recently sent as a message about her family history of EDS, but her family members' genetic testing for EDS was negative.  I called patient back, she does not have intermittent joint achiness, but improved with IV iron.  Not interested in EDS genetic testing, and I do not feel it's necessary.   Patient recently seen gastroenterologist Dr. Simona Huh and underwent EGD and colonoscopy iron deficient anemia, which came back unremarkable.  However patient does not think her iron deficiency is from her menorrhagia, which she has had since young age.  She really would like to know what caused her recent iron deficiency.  I discussed the option of capsule endoscopy, and I will reach out to Dr. Simona Huh to see if that recommended.  If not, I will obtain CT abdomen and pelvis ruled out small intestine tumor.  Her celiac work-up was negative.  Patient will continue monthly lab, and come in for IV iron if needed.  She is scheduled to see me in February. I will see her sooner if needed.   Amanda Murray  04/17/2021

## 2021-04-18 ENCOUNTER — Other Ambulatory Visit: Payer: BC Managed Care – PPO

## 2021-04-20 ENCOUNTER — Other Ambulatory Visit: Payer: BC Managed Care – PPO

## 2021-04-20 ENCOUNTER — Other Ambulatory Visit: Payer: Self-pay

## 2021-04-20 ENCOUNTER — Inpatient Hospital Stay: Payer: BC Managed Care – PPO | Attending: Hematology

## 2021-04-20 DIAGNOSIS — K529 Noninfective gastroenteritis and colitis, unspecified: Secondary | ICD-10-CM | POA: Diagnosis not present

## 2021-04-20 DIAGNOSIS — D509 Iron deficiency anemia, unspecified: Secondary | ICD-10-CM | POA: Insufficient documentation

## 2021-04-20 LAB — CBC WITH DIFFERENTIAL (CANCER CENTER ONLY)
Abs Immature Granulocytes: 0.01 10*3/uL (ref 0.00–0.07)
Basophils Absolute: 0.1 10*3/uL (ref 0.0–0.1)
Basophils Relative: 1 %
Eosinophils Absolute: 0.3 10*3/uL (ref 0.0–0.5)
Eosinophils Relative: 4 %
HCT: 45.8 % (ref 36.0–46.0)
Hemoglobin: 14.5 g/dL (ref 12.0–15.0)
Immature Granulocytes: 0 %
Lymphocytes Relative: 20 %
Lymphs Abs: 1.4 10*3/uL (ref 0.7–4.0)
MCH: 26.1 pg (ref 26.0–34.0)
MCHC: 31.7 g/dL (ref 30.0–36.0)
MCV: 82.4 fL (ref 80.0–100.0)
Monocytes Absolute: 0.5 10*3/uL (ref 0.1–1.0)
Monocytes Relative: 7 %
Neutro Abs: 4.9 10*3/uL (ref 1.7–7.7)
Neutrophils Relative %: 68 %
Platelet Count: 235 10*3/uL (ref 150–400)
RBC: 5.56 MIL/uL — ABNORMAL HIGH (ref 3.87–5.11)
WBC Count: 7.2 10*3/uL (ref 4.0–10.5)
nRBC: 0 % (ref 0.0–0.2)

## 2021-04-20 LAB — IRON AND TIBC
Iron: 93 ug/dL (ref 41–142)
Saturation Ratios: 38 % (ref 21–57)
TIBC: 246 ug/dL (ref 236–444)
UIBC: 153 ug/dL (ref 120–384)

## 2021-04-20 LAB — FERRITIN: Ferritin: 151 ng/mL (ref 11–307)

## 2021-04-21 ENCOUNTER — Encounter: Payer: Self-pay | Admitting: Hematology

## 2021-04-23 ENCOUNTER — Encounter: Payer: Self-pay | Admitting: Internal Medicine

## 2021-04-24 NOTE — Progress Notes (Signed)
I, Wendy Poet, LAT, ATC, am serving as scribe for Dr. Lynne Leader.  Amanda Murray is a 45 y.o. female who presents to [place] at [facility] today for f/u of L hip and R hand pain.  She was last seen by Dr. Georgina Snell on 01/29/21 for f/u of B hand pain and diffuse arthralgias and myalgias and had B GT steroid injections.  She was also shown a HEP focusing on hip aBd and glute strengthening.  Prior to that, she was last seen by Dr. Georgina Snell on 01/09/21 and a complete rheumatologic work-up with HLA-B27, work-up microcytosis with TIBC iron stores and ferritin, was referred to hand PT at [facility]. Additionally, based on lab test results, pt was advised to take oral iron supplements and was referred to hematology to set up an iron infusion. Her first visit was on 01/18/21 and is planned to start IV venofer 458m weekly x3 weeks. Today, pt reports pain along the L side of her low back/hip/SIJ. Pt c/o pain deep within the L hip w/ mechanical symptoms. Pt notes benefit from prior iron supplementation waking up after the 2nd treatment w/o pain.   She has significant left anterior and lateral hip pain.  This has been ongoing since at least March of this year.  She had anterior intra-articular hip injection with Dr. RPaulla Foreand had a lateral greater trochanter hip injection with me and September.  He has been doing home exercise program directed by me since at least September which has helped some.  She notes continued bothersome pain limiting her ability to exercise.   Additionally she still notes diffuse overall pain.   Dx testing: 01/09/21 R & L hand XR             01/09/21 Labs (HLA-B27, iron, TIBC, and Ferritin panel)  Pertinent review of systems: No fevers or chills  Relevant historical information: Iron deficiency.  Rheumatologic work-up so far negative.  History of depression.   Exam:  BP (!) 140/92   Pulse (!) 115   Ht _0  (1.778 m)   Wt 169 lb 6.4 oz (76.8 kg)   LMP 04/18/2021    SpO2 99%   BMI 24.31 kg/m  General: Well Developed, well nourished, and in no acute distress.   MSK: Left hip.  Normal-appearing To palpation greater trochanter.  Pain with hip flexion.  Hip abduction strength intact    Lab and Radiology Results  X-ray images left hip obtained today personally and independently interpreted Minimal DJD. 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  Await formal radiology review     Assessment and Plan: 45y.o. female with left hip pain.  Majority of pain thought to be hip impingement.  She does have some lateral hip pain due to hip abductor tendinopathy or trochanteric bursitis.  She is failing conservative management.  Plan for MRI arthrogram to further characterize source of pain.  X-ray obtained today.  Await radiology overread.  Is for her more diffuse pain fibromyalgia suspected.  She already has reasonable medication regimen that would be typical for fibromyalgia including high-dose gabapentin.  She will discuss with her psychiatrist potentially switching from venlafaxine to Cymbalta.  For now start low-dose nortriptyline at bedtime.   Check after MRI.  PDMP not reviewed this encounter. Orders Placed This Encounter  Procedures   DG HIP UNILAT W OR W/O PELVIS 2-3 VIEWS LEFT    Standing Status:   Future    Number of Occurrences:   1  Standing Expiration Date:   04/25/2022    Order Specific Question:   Reason for Exam (SYMPTOM  OR DIAGNOSIS REQUIRED)    Answer:   left hip pain    Order Specific Question:   Preferred imaging location?    Answer:   [facility]    Order Specific Question:   Is patient pregnant?    Answer:   No   MR HIP LEFT W CONTRAST    MRI arthrogram only. No IV contrast. Schedule w/ Dr. Darene Lamer 1 hour prior for injection.    Standing Status:   Future    Standing Expiration Date:   04/25/2022    Scheduling Instructions:     MRI arthrogram only. No IV contrast. Schedule w/ Dr. Darene Lamer 1 hour prior for injection.    Order  Specific Question:   Reason for Exam (SYMPTOM  OR DIAGNOSIS REQUIRED)    Answer:   left hip pain    Order Specific Question:   If indicated for the ordered procedure, I authorize the administration of contrast media per Radiology protocol    Answer:   Yes    Order Specific Question:   What is the patient's sedation requirement?    Answer:   No Sedation    Order Specific Question:   Does the patient have a pacemaker or implanted devices?    Answer:   No    Order Specific Question:   Preferred imaging location?    Answer:   [facility] (table limit-350lbs)   Meds ordered this encounter  Medications   nortriptyline (PAMELOR) 25 MG capsule    Sig: Take 1 capsule (25 mg total) by mouth at bedtime.    Dispense:  30 capsule    Refill:  2     Discussed warning signs or symptoms. Please see discharge instructions. Patient expresses understanding.   The above documentation has been reviewed and is accurate and complete Lynne Leader, M.D.

## 2021-04-25 ENCOUNTER — Other Ambulatory Visit: Payer: Self-pay

## 2021-04-25 ENCOUNTER — Ambulatory Visit (INDEPENDENT_AMBULATORY_CARE_PROVIDER_SITE_OTHER): Payer: BC Managed Care – PPO

## 2021-04-25 ENCOUNTER — Ambulatory Visit: Payer: BC Managed Care – PPO | Admitting: Internal Medicine

## 2021-04-25 ENCOUNTER — Encounter: Payer: Self-pay | Admitting: Hematology

## 2021-04-25 ENCOUNTER — Ambulatory Visit (INDEPENDENT_AMBULATORY_CARE_PROVIDER_SITE_OTHER): Payer: BC Managed Care – PPO | Admitting: Family Medicine

## 2021-04-25 VITALS — BP 140/92 | HR 115 | Ht 70.0 in | Wt 169.4 lb

## 2021-04-25 DIAGNOSIS — M797 Fibromyalgia: Secondary | ICD-10-CM

## 2021-04-25 DIAGNOSIS — M25552 Pain in left hip: Secondary | ICD-10-CM

## 2021-04-25 DIAGNOSIS — G8929 Other chronic pain: Secondary | ICD-10-CM

## 2021-04-25 LAB — PANCREATIC ELASTASE, FECAL: Pancreatic Elastase-1, Stool: >500 mcg/g

## 2021-04-25 MED ORDER — NORTRIPTYLINE HCL 25 MG PO CAPS: 25.0000 mg | ORAL_CAPSULE | Freq: Every day | ORAL | 2 refills | Status: DC

## 2021-04-25 NOTE — Progress Notes (Signed)
Patient called referred regarding billing concerns. She explained she printed off [employer] settlement application from website. Advised patient balance must be at least $1610 to submit application and she is not quite there yet. Advised to hold off to prevent application being denied. She verbalized understanding.  Discussed one-time $1000 Radio broadcast assistant to assist with personal expenses while going through treatment. Advised what is needed for her to apply which is letter of support. She verbalized understanding and will bring on 1/6 at next visit to meet me after lab appointment. I will look to explore any other assistance opportunities for her diagnosis/medication.  She has my card for any additional financial questions or concerns.

## 2021-04-25 NOTE — Patient Instructions (Addendum)
Thank you for coming in today.   Please get an Xray today before you leave   Talk to your doctor about Cymbata for fibromyalgia.   Please call [facility] to schedule your MRI, [phone]   I sent the Nortriptyline to you pharmacy. Take it at bedtime.  Check back after MRI

## 2021-04-26 ENCOUNTER — Other Ambulatory Visit: Payer: Self-pay

## 2021-04-27 ENCOUNTER — Encounter: Payer: Self-pay | Admitting: Family Medicine

## 2021-04-27 DIAGNOSIS — F19120 Other psychoactive substance abuse with intoxication, uncomplicated: Secondary | ICD-10-CM | POA: Diagnosis not present

## 2021-04-27 DIAGNOSIS — F331 Major depressive disorder, recurrent, moderate: Secondary | ICD-10-CM | POA: Diagnosis not present

## 2021-04-30 ENCOUNTER — Ambulatory Visit: Payer: BC Managed Care – PPO | Admitting: Sports Medicine

## 2021-04-30 ENCOUNTER — Other Ambulatory Visit: Payer: BC Managed Care – PPO

## 2021-05-02 ENCOUNTER — Encounter: Payer: Self-pay | Admitting: Family Medicine

## 2021-05-02 NOTE — Progress Notes (Signed)
Hip x-ray looks normal to radiology

## 2021-05-03 ENCOUNTER — Other Ambulatory Visit: Payer: Self-pay

## 2021-05-03 DIAGNOSIS — M25552 Pain in left hip: Secondary | ICD-10-CM

## 2021-05-08 NOTE — Telephone Encounter (Signed)
Capsule study and then office visit.   Then we can discuss everything in person - portal message does not seem to be optimal in this scenario.  - HD

## 2021-05-10 ENCOUNTER — Telehealth: Payer: Self-pay

## 2021-05-10 ENCOUNTER — Other Ambulatory Visit: Payer: Self-pay | Admitting: Family Medicine

## 2021-05-10 DIAGNOSIS — D500 Iron deficiency anemia secondary to blood loss (chronic): Secondary | ICD-10-CM

## 2021-05-10 DIAGNOSIS — G8929 Other chronic pain: Secondary | ICD-10-CM

## 2021-05-10 NOTE — Telephone Encounter (Signed)
This has been addressed, see 12/29 telephone note for details.

## 2021-05-10 NOTE — Telephone Encounter (Signed)
Pt called into the office today. She sent a message via my chart. She states that she woke up at 4 am with vomiting and again around 9:30 am. Pt states that she thinks it was a large volume, she describes as "cloudy dark pink color". Pt states that she did not eat anything red, she did have some blueberries from a cheesecake and some sage Kombucha. Pt states that she does not drink alcohol. Pt has been able to tolerate water and coffee today, no solid foods. I advised pt to remain on a liquid diet today and slowly advance diet tomorrow to bananas, applesauce, etc. Advised that the capsule endoscopy would be recommended even more now that she thinks that she may be having blood in her vomit. Pt wanted to proceed. Pt has been scheduled for a capsule endoscopy on Tuesday, 05/15/21 at 8:30 am. Pt is aware that I will send her instructions via my chart for her review. Pt's f/u appt has been rescheduled to Tuesday, 05/29/21 at 1:20 pm. Pt is aware that if she develops coffee ground emesis, unable to tolerate anything PO then she will need to go to ED for evaluation. Pt verbalized understanding and had no concerns at the end of the call.  Ambulatory referral to GI in epic. Instructions sent to patient via my chart.

## 2021-05-11 DIAGNOSIS — F331 Major depressive disorder, recurrent, moderate: Secondary | ICD-10-CM | POA: Diagnosis not present

## 2021-05-11 DIAGNOSIS — F4312 Post-traumatic stress disorder, chronic: Secondary | ICD-10-CM | POA: Diagnosis not present

## 2021-05-11 DIAGNOSIS — F19120 Other psychoactive substance abuse with intoxication, uncomplicated: Secondary | ICD-10-CM | POA: Diagnosis not present

## 2021-05-11 DIAGNOSIS — F332 Major depressive disorder, recurrent severe without psychotic features: Secondary | ICD-10-CM | POA: Diagnosis not present

## 2021-05-16 ENCOUNTER — Other Ambulatory Visit: Payer: Self-pay | Admitting: Internal Medicine

## 2021-05-18 ENCOUNTER — Other Ambulatory Visit: Payer: Self-pay

## 2021-05-18 ENCOUNTER — Inpatient Hospital Stay: Payer: BC Managed Care – PPO | Attending: Hematology

## 2021-05-18 DIAGNOSIS — D509 Iron deficiency anemia, unspecified: Secondary | ICD-10-CM | POA: Diagnosis not present

## 2021-05-18 DIAGNOSIS — D500 Iron deficiency anemia secondary to blood loss (chronic): Secondary | ICD-10-CM

## 2021-05-18 LAB — IRON AND IRON BINDING CAPACITY (CC-WL,HP ONLY)
Iron: 96 ug/dL (ref 28–170)
Saturation Ratios: 41 % — ABNORMAL HIGH (ref 10.4–31.8)
TIBC: 232 ug/dL — ABNORMAL LOW (ref 250–450)
UIBC: 136 ug/dL — ABNORMAL LOW (ref 148–442)

## 2021-05-18 LAB — CBC WITH DIFFERENTIAL (CANCER CENTER ONLY)
Abs Immature Granulocytes: 0.01 10*3/uL (ref 0.00–0.07)
Basophils Absolute: 0.1 10*3/uL (ref 0.0–0.1)
Basophils Relative: 1 %
Eosinophils Absolute: 0.2 10*3/uL (ref 0.0–0.5)
Eosinophils Relative: 4 %
HCT: 42.9 % (ref 36.0–46.0)
Hemoglobin: 14 g/dL (ref 12.0–15.0)
Immature Granulocytes: 0 %
Lymphocytes Relative: 25 %
Lymphs Abs: 1.5 10*3/uL (ref 0.7–4.0)
MCH: 28 pg (ref 26.0–34.0)
MCHC: 32.6 g/dL (ref 30.0–36.0)
MCV: 85.8 fL (ref 80.0–100.0)
Monocytes Absolute: 0.7 10*3/uL (ref 0.1–1.0)
Monocytes Relative: 11 %
Neutro Abs: 3.5 10*3/uL (ref 1.7–7.7)
Neutrophils Relative %: 59 %
Platelet Count: 262 10*3/uL (ref 150–400)
RBC: 5 MIL/uL (ref 3.87–5.11)
RDW: 19.5 % — ABNORMAL HIGH (ref 11.5–15.5)
WBC Count: 6 10*3/uL (ref 4.0–10.5)
nRBC: 0 % (ref 0.0–0.2)

## 2021-05-18 LAB — FERRITIN: Ferritin: 75 ng/mL (ref 11–307)

## 2021-05-18 NOTE — Telephone Encounter (Signed)
Inbound call from patient states she needed to cancel procedure due to insurance and possible would need to reschedule

## 2021-05-18 NOTE — Telephone Encounter (Signed)
Attempted to reach pt. Her vm is full and cannot accept any new messages at this time. Will try to reach pt again at a different time.

## 2021-05-21 ENCOUNTER — Ambulatory Visit: Payer: BC Managed Care – PPO | Admitting: Internal Medicine

## 2021-05-21 ENCOUNTER — Ambulatory Visit: Payer: BC Managed Care – PPO | Admitting: Gastroenterology

## 2021-05-21 NOTE — Telephone Encounter (Signed)
2nd attempt to reach patient. Her vm is full and cannot accept any new messages at this time. Will try to reach pt again at a different time.

## 2021-05-24 ENCOUNTER — Encounter: Payer: Self-pay | Admitting: Gastroenterology

## 2021-05-24 NOTE — Telephone Encounter (Signed)
3rd attempt to reach patient. Her vm is currently full and cannot accept any new messages at this time.  Will send my chart message to patient.

## 2021-05-25 DIAGNOSIS — F19120 Other psychoactive substance abuse with intoxication, uncomplicated: Secondary | ICD-10-CM | POA: Diagnosis not present

## 2021-05-25 DIAGNOSIS — F331 Major depressive disorder, recurrent, moderate: Secondary | ICD-10-CM | POA: Diagnosis not present

## 2021-05-25 NOTE — Telephone Encounter (Signed)
Patient and I have communicated via my chart message. Her capsule endoscopy has been rescheduled to Monday, 05/28/21 at 8:30 am. New instructions have been sent to patient via my chart.

## 2021-05-27 ENCOUNTER — Encounter: Payer: Self-pay | Admitting: Hematology

## 2021-05-28 ENCOUNTER — Encounter: Payer: Self-pay | Admitting: Gastroenterology

## 2021-05-28 ENCOUNTER — Ambulatory Visit (INDEPENDENT_AMBULATORY_CARE_PROVIDER_SITE_OTHER): Payer: BC Managed Care – PPO | Admitting: Gastroenterology

## 2021-05-28 DIAGNOSIS — E611 Iron deficiency: Secondary | ICD-10-CM

## 2021-05-28 NOTE — Progress Notes (Signed)
Please return to the office at 4pm to have the monitor removed.  At 11am you may have clear liquids to drink.  At 1pm you may have a light lunch such as 1/2 sandwich and a cup of soup or scrambled eggs and toast.   You should pass the capsule in 2-3 days. Please look to see if you have passed it, if you do not think you have call our office. The doctor would order an xray to make sure if you have passed it or not.

## 2021-05-28 NOTE — Progress Notes (Signed)
Pt here at 8:30am for capsule endo. Pt reports no problem with prep. Pt swallowed capsule without any issues.   Capsule ID [id number] Exp:  06/22/2022

## 2021-05-29 ENCOUNTER — Ambulatory Visit: Payer: BC Managed Care – PPO | Admitting: Gastroenterology

## 2021-06-04 ENCOUNTER — Encounter: Payer: Self-pay | Admitting: Gastroenterology

## 2021-06-04 ENCOUNTER — Other Ambulatory Visit: Payer: Self-pay

## 2021-06-04 ENCOUNTER — Ambulatory Visit
Admission: RE | Admit: 2021-06-04 | Discharge: 2021-06-04 | Disposition: A | Discharge status: Home / Self Care | Payer: BC Managed Care – PPO | Source: Ambulatory Visit | Attending: Family Medicine | Admitting: Family Medicine

## 2021-06-04 ENCOUNTER — Other Ambulatory Visit: Payer: BC Managed Care – PPO

## 2021-06-04 DIAGNOSIS — G8929 Other chronic pain: Secondary | ICD-10-CM

## 2021-06-04 DIAGNOSIS — Z0389 Encounter for observation for other suspected diseases and conditions ruled out: Secondary | ICD-10-CM | POA: Diagnosis not present

## 2021-06-04 DIAGNOSIS — M1612 Unilateral primary osteoarthritis, left hip: Secondary | ICD-10-CM | POA: Diagnosis not present

## 2021-06-04 DIAGNOSIS — M25552 Pain in left hip: Secondary | ICD-10-CM

## 2021-06-04 MED ORDER — IOPAMIDOL (ISOVUE-M 300) INJECTION 61%
10.0000 mL | Freq: Once | INTRAMUSCULAR | Status: AC | PRN
Start: 2021-06-04 — End: 2021-06-04
  Administered 2021-06-04: 10 mL via INTRA_ARTICULAR

## 2021-06-06 NOTE — Progress Notes (Signed)
Left hip MRI arthrogram shows concern for hip impingement and a little bit of arthritis.  This is probably the source of your pain. Additionally there are what looks to be cysts near the ovaries that we can see accidentally as part of this MRI that the radiologist want to follow-up with an ultrasound in 6 to 12 weeks. Recommend return to clinic to go over the results of full detail and discuss treatment plan and options.

## 2021-06-08 ENCOUNTER — Other Ambulatory Visit: Payer: Self-pay

## 2021-06-08 ENCOUNTER — Ambulatory Visit: Payer: BC Managed Care – PPO | Admitting: Family Medicine

## 2021-06-08 ENCOUNTER — Encounter: Payer: Self-pay | Admitting: Hematology

## 2021-06-08 ENCOUNTER — Encounter: Payer: Self-pay | Admitting: Family Medicine

## 2021-06-08 VITALS — BP 142/98 | HR 96 | Ht 70.0 in | Wt 162.3 lb

## 2021-06-08 DIAGNOSIS — M25552 Pain in left hip: Secondary | ICD-10-CM | POA: Diagnosis not present

## 2021-06-08 DIAGNOSIS — G8929 Other chronic pain: Secondary | ICD-10-CM

## 2021-06-08 NOTE — Progress Notes (Signed)
I, Peterson Lombard, LAT, ATC acting as a scribe for Lynne Leader, MD.  Amanda Murray is a 46 y.o. female who presents to [place] at [facility] today for f/u fibromyalgia, L hip pain, and MRI review. Pt was last seen by Dr. Georgina Snell on 04/25/21 and was advised to proceed to MRI arthrogram to further characterize source of pain. Pt was also advised to discuss with her psychiatrist potentially switching from venlafaxine to Cymbalta and was prescribed low-dose nortriptyline at bedtime. Today, pt reports she did not get a steroid injection when she got her MRI arthrogram. Pt reports a pain level around 6-7/10. Pt thinks the nortriptyline may be helping her at night. Pt also think that switching to the cymbalta may be helping as well.  Pt c/o increased pain and "cracking" in her R hand that worsened last night. Pt hadn't been wearing her "copper gloves" in awhile.   Dx imaging: 06/04/21 L-hip MRI arthrogram  04/25/21 L hip XR  Pertinent review of systems: No fevers or chills  Relevant historical information: Fibromyalgia   Exam:  BP (!) 142/98    Pulse 96    Ht 5\' 10"  (1.778 m)    Wt 162 lb 4.8 oz (73.6 kg)    SpO2 99%    BMI 23.29 kg/m  General: Well Developed, well nourished, and in no acute distress.   MSK: Left hip pain with flexion.    Lab and Radiology Results No results found for this or any previous visit (from the past 72 hour(s)). MR HIP LEFT W CONTRAST  Result Date: 06/05/2021 CLINICAL DATA:  Left hip pain for 2 years. Patient reports injured left leg 10 years ago and hyperextended her leg when she fell between a pier and a lower pier. EXAM: MR ARTHROGRAM OF THE LEFT HIP WITHOUT CONTRAST TECHNIQUE: Multiplanar, multisequence MR imaging was performed following the administration of intra-articular contrast. CONTRAST:  See Injection Documentation COMPARISON:  X-ray hip 04/25/2021. FINDINGS: Bones: No acute fracture. No dislocation. No femoral head avascular necrosis.  Slight decreased offset of the bilateral femoral head-neck junctions. Slight acetabular over-coverage bilaterally. Bony pelvis intact without diastasis. SI joints and pubic symphysis within normal limits. No bone marrow edema. No marrow replacing bone lesion. Articular cartilage and labrum Articular cartilage: Chondral thinning along the superolateral aspect of the femoral head and superior chondrolabral junction. No subchondral marrow signal changes. Labrum:  Intact.  No paralabral cyst. Joint or bursal effusion Joint effusion: Well distended with injected contrast. Split injection with extracapsular contrast extending into the anterior soft tissues. Bursae: No abnormal bursal fluid collection. Muscles and tendons Muscles and tendons: The gluteal, hamstring, iliopsoas, rectus femoris, and adductor tendons appear intact without tear or significant tendinosis. Normal muscle bulk and signal intensity without edema, atrophy, or fatty infiltration. Other findings Miscellaneous: No soft tissue edema or fluid collection. No inguinal lymphadenopathy. Bilateral adnexal cysts with layering hematocrit levels each measuring up to 3.4 cm (series 11, image 1). Small uterine fibroids. Nabothian cysts at the level of the cervix. IMPRESSION: 1. 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 2. Mild osteoarthritis of the left hip. 3. Bilateral adnexal cysts with layering hematocrit levels each measuring up to 3.4 cm. Findings may represent endometriomas or hemorrhagic cysts. Recommend follow-up pelvic ultrasound in 6-12 weeks. Electronically Signed   By: Davina Poke D.O.   On: 06/05/2021 10:38   DG FLUORO GUIDED NEEDLE PLC ASPIRATION/INJECTION LOC  Result Date: 06/04/2021  CLINICAL DATA:  LEFT HIP ARTHROGRAM EXAM: LEFT HIP INJECTION FOR MRI FLUOROSCOPY TIME:  1.2 minutes (7 mGy) PROCEDURE: Overlying skin prepped with Betadine, draped in the  usual sterile fashion, and infiltrated locally with Lidocaine. A 20 gauge spinal needle advanced to the superolateral margin of the left femoral head. 1 ml of Lidocaine injected easily. A mixture of 0.1 ml MultiHance in 20 ml of dilute iodinated contrast material was then used to opacify the left femoral head. Note was made of partial split injection into the extra capsular soft tissues. No immediate complication. IMPRESSION: Technically successful left hip injection under fluoroscopy for MR arthrogram. Electronically Signed   By: Albin Felling M.D.   On: 06/04/2021 15:43    I, Lynne Leader, personally (independently) visualized and performed the x-ray interpretation of the images attached in this note.    Assessment and Plan: 46 y.o. 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 She is failed conservative management.  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  She does not have a lot of DJD nor a significant labrum tear so she may be a good candidate for hip scope and reshaping. We discussed this.  She is not sure that she wants to have surgery but is considering it. Will refer to Dr. Aretha Parrot to discuss surgical options.  Additionally she has potential hemorrhagic cysts in the pelvis on MRI.  We will route the MRI results to her OB/GYN.  She may benefit from follow-up pelvic ultrasound.  Happy to do this myself but certainly makes more sense for OB/GYN to do this if possible. CC Dr. Valentino Saxon   PDMP not reviewed this encounter. Orders Placed This Encounter  Procedures   Ambulatory referral to Orthopedic Surgery    Referral Priority:   Routine    Referral Type:   Surgical    Referral Reason:   Specialty Services Required    Requested Specialty:   Orthopedic Surgery    Number of Visits Requested:   1   No orders of the defined types were placed in this encounter.    Discussed warning signs or  symptoms. Please see discharge instructions. Patient expresses understanding.   The above documentation has been reviewed and is accurate and complete Lynne Leader, M.D.

## 2021-06-08 NOTE — Patient Instructions (Addendum)
Thank you for coming in today.   I've referred to you to see Dr. Aretha Parrot for a surgical consultation.  Get a copy of your MRI images  Recheck back with me as needed

## 2021-06-12 ENCOUNTER — Encounter: Payer: Self-pay | Admitting: Family Medicine

## 2021-06-12 ENCOUNTER — Encounter: Payer: Self-pay | Admitting: Gastroenterology

## 2021-06-12 ENCOUNTER — Ambulatory Visit (INDEPENDENT_AMBULATORY_CARE_PROVIDER_SITE_OTHER): Payer: BC Managed Care – PPO | Admitting: Gastroenterology

## 2021-06-12 DIAGNOSIS — K529 Noninfective gastroenteritis and colitis, unspecified: Secondary | ICD-10-CM

## 2021-06-12 DIAGNOSIS — D508 Other iron deficiency anemias: Secondary | ICD-10-CM

## 2021-06-12 NOTE — Progress Notes (Addendum)
[facility] Progress Note  Chief Complaint: Iron deficiency anemia and chronic diarrhea  Subjective  History: Amanda Murray was seen in office consultation October 2022 for chronic diarrhea and iron deficiency anemia.  Clinical details in that visit note.  Negative celiac antibody. EGD and colonoscopy performed, no visible source of blood loss seen, biopsy negative for celiac sprue and microscopic colitis.  She was seen by Dr. Burr Medico of hematology for consideration of IV iron, and she recommended a video capsule study.  My clinical impression was likely either nonspecific impairment of duodenal iron absorption or IDA from chronic menstrual blood loss, though the patient disagreed with that assessment (see portal messages). The recent small bowel video capsule study was a normal study.  Antispasmodic therapy was recommended after the colonoscopy, fecal elastase normal (previous heavy alcohol use).  Amanda Murray was seen in follow-up.  Chart review in preparation performed prior to entering the room.  Before I could begin asking further questions about her diarrhea or other symptoms at this point, Amanda Murray launched into an angry tirade about how she has been going to doctors continuously since last summer and gets no answers, is frustrated by ongoing diarrhea and hip problems, how all of this is going to impair her ability to start a new job scheduled to start soon.  "Are you going to pay all my bills?"  "I have borderline personality disorder and anxiety, do not you understand that?"  I said I understood and was trying to explain how I asked her to come to the office so we could communicate face-to-face, since doing so through portal messages and phone calls did not seem to be an effective and satisfying means of communication for either of of [place] in this scenario.  She felt that all of her providers should get together and have a meeting about her to figure out what was going on, as she just could not go on this  way.  Her anger was steadily escalating, at which point I said that I could see she was having a difficult time and was going to step out of the room for a minute so she could collect herself, after which I would return and try to have a conversation about this.  This upset her further, she angrily grabbed her bag and vociferously stormed out of the office.  ROS: Cardiovascular:  no chest pain Respiratory: no dyspnea  The patient's Past Medical, Family and Social History were reviewed and are on file in the EMR.  Objective:  Med list reviewed  Current Outpatient Medications:    amphetamine-dextroamphetamine (ADDERALL XR) 30 MG 24 hr capsule, Take 30 mg by mouth daily., Disp: , Rfl:    Cholecalciferol (VITAMIN D-3) 25 MCG (1000 UT) CAPS, Take 1,000 Units by mouth daily. Soft gel, Disp: , Rfl:    clonazePAM (KLONOPIN) 0.5 MG tablet, Take 0.5 mg by mouth 2 (two) times daily as needed for anxiety., Disp: , Rfl:    DULoxetine (CYMBALTA) 60 MG capsule, Take 60 mg by mouth daily., Disp: , Rfl:    folic acid (FOLVITE) 1 MG tablet, Take 1 mg by mouth daily., Disp: , Rfl:    gabapentin (NEURONTIN) 800 MG tablet, Take 800 mg by mouth 3 (three) times daily., Disp: , Rfl:    lamoTRIgine (LAMICTAL) 150 MG tablet, Take 150 mg by mouth daily., Disp: , Rfl:    nortriptyline (PAMELOR) 25 MG capsule, Take 1 capsule (25 mg total) by mouth at bedtime., Disp: 30 capsule, Rfl:  2   traZODone (DESYREL) 50 MG tablet, Take 50 mg by mouth at bedtime as needed for sleep., Disp: , Rfl:    valACYclovir (VALTREX) 1000 MG tablet, TAKE 1 TABLET(1000 MG) BY MOUTH TWICE DAILY (Patient taking differently: 1,000 mg 2 (two) times daily. As needed), Disp: 14 tablet, Rfl: 2   Vital signs in last 24 hrs: There were no vitals filed for this visit. Wt Readings from Last 3 Encounters:  06/08/21 162 lb 4.8 oz (73.6 kg)  04/25/21 169 lb 6.4 oz (76.8 kg)  03/30/21 174 lb (78.9 kg)    No exam could be performed.  Labs:  Fecal  elastase over 500 ___________________________________________ Radiologic studies:   ____________________________________________ Other: Duodenal and colon biopsies as noted above  _____________________________________________ Assessment & Plan  Assessment: Encounter Diagnoses  Name Primary?   Chronic diarrhea Yes   Other iron deficiency anemia     See above for explanation of patient leaving office and ending the visit.  Therefore, no further testing or treatment recommendations could be given at this point.   20 minutes were spent on this encounter, 17 of which were documentation.  Nelida Meuse III

## 2021-06-13 DIAGNOSIS — F331 Major depressive disorder, recurrent, moderate: Secondary | ICD-10-CM | POA: Diagnosis not present

## 2021-06-13 DIAGNOSIS — F19120 Other psychoactive substance abuse with intoxication, uncomplicated: Secondary | ICD-10-CM | POA: Diagnosis not present

## 2021-06-13 NOTE — Progress Notes (Signed)
° °  I, Wendy Poet, LAT, ATC, am serving as scribe for Dr. Lynne Leader.  Amanda Murray is a 46 y.o. female who presents to [place] at 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  She was last seen by Dr. Georgina Snell on 06/08/21 for L hip MRI review and was referred to Dr. Aretha Parrot for consultation.  Today, pt reports that her L hip pain started worsening on Friday, her pain increasing to an 8/10.  Dx imaging: 06/04/21 L-hip MRI arthrogram             04/25/21 L hip XR Pertinent review of systems: No fevers or chills  Relevant historical information: Depression.  Iron deficiency anemia.   Exam:  BP 110/76 (BP Location: Left Arm, Patient Position: Sitting, Cuff Size: Normal)    Pulse 95    Ht 5\' 10"  (1.778 m)    Wt 166 lb (75.3 kg)    SpO2 97%    BMI 23.82 kg/m  General: Well Developed, well nourished, and in no acute distress.   MSK: Left hip normal-appearing Normal hip motion pain with flexion.    Lab and Radiology Results  Procedure: Real-time Ultrasound Guided Injection of left hip and articular femoral acetabular Device: Philips Affiniti 50G Images permanently stored and available for review in PACS Verbal informed consent obtained.  Discussed risks and benefits of procedure. Warned about infection bleeding damage to structures skin hypopigmentation and fat atrophy among others. Patient expresses understanding and agreement Time-out conducted.   Noted no overlying erythema, induration, or other signs of local infection.   Skin prepped in a sterile fashion.   Local anesthesia: Topical Ethyl chloride.   With sterile technique and under real time ultrasound guidance: 40 mg of Kenalog and 2 mL of Marcaine injected into joint capsule. Fluid seen entering the joint.   Completed without difficulty   Pain immediately resolved suggesting accurate placement of the medication.   Advised to call if fevers/chills, erythema, induration, drainage, or  persistent bleeding.   Images permanently stored and available for review in the ultrasound unit.  Impression: Technically successful ultrasound guided injection.         Assessment and Plan: 46 y.o. female with left hip pain thought to be due to impingement.  Patient is already been referred to Dr. Aretha Parrot to discuss her surgical options.  However she is having a fair amount of pain.  We will proceed with injection today for pain control.  Recheck back with me as needed.   PDMP not reviewed this encounter. Orders Placed This Encounter  Procedures   [place] LIMITED JOINT SPACE STRUCTURES LOW LEFT(NO LINKED CHARGES)    Order Specific Question:   Reason for Exam (SYMPTOM  OR DIAGNOSIS REQUIRED)    Answer:   L hip pain    Order Specific Question:   Preferred imaging location?    Answer:   [place]   No orders of the defined types were placed in this encounter.    Discussed warning signs or symptoms. Please see discharge instructions. Patient expresses understanding.   The above documentation has been reviewed and is accurate and complete Lynne Leader, M.D.

## 2021-06-18 ENCOUNTER — Telehealth: Payer: Self-pay

## 2021-06-18 ENCOUNTER — Ambulatory Visit (INDEPENDENT_AMBULATORY_CARE_PROVIDER_SITE_OTHER): Payer: BC Managed Care – PPO | Admitting: Family Medicine

## 2021-06-18 ENCOUNTER — Other Ambulatory Visit: Payer: Self-pay

## 2021-06-18 ENCOUNTER — Ambulatory Visit: Payer: Self-pay

## 2021-06-18 ENCOUNTER — Encounter: Payer: Self-pay | Admitting: Family Medicine

## 2021-06-18 VITALS — BP 110/76 | HR 95 | Ht 70.0 in | Wt 166.0 lb

## 2021-06-18 DIAGNOSIS — M25552 Pain in left hip: Secondary | ICD-10-CM | POA: Diagnosis not present

## 2021-06-18 DIAGNOSIS — G8929 Other chronic pain: Secondary | ICD-10-CM

## 2021-06-18 NOTE — Progress Notes (Signed)
MRI hip report faxed to Dr. Pamala Hurry per request of MD.  No further concerns at this time.

## 2021-06-18 NOTE — Telephone Encounter (Signed)
-----   Message from Doran Stabler, MD sent at 06/15/2021  3:10 PM EST ----- Please prepare letter to discharge this patient from [facility] gastroenterology citing breakdown of physician-patient relationship.  HD

## 2021-06-18 NOTE — Patient Instructions (Addendum)
Good to see you today.  You had a L hip injection.  Call or go to the ER if you develop a large red swollen joint with extreme pain or oozing puss.   Still plan to see Dr. Aretha Parrot for consultation regarding your R hip pain.  Follow-up: as needed

## 2021-06-18 NOTE — Telephone Encounter (Signed)
Letter created

## 2021-06-19 ENCOUNTER — Other Ambulatory Visit: Payer: Self-pay

## 2021-06-20 DIAGNOSIS — F331 Major depressive disorder, recurrent, moderate: Secondary | ICD-10-CM | POA: Diagnosis not present

## 2021-06-20 DIAGNOSIS — F19120 Other psychoactive substance abuse with intoxication, uncomplicated: Secondary | ICD-10-CM | POA: Diagnosis not present

## 2021-06-22 ENCOUNTER — Encounter: Payer: Self-pay | Admitting: Hematology

## 2021-06-22 ENCOUNTER — Inpatient Hospital Stay: Payer: BC Managed Care – PPO

## 2021-06-22 ENCOUNTER — Encounter: Payer: Self-pay | Admitting: Gastroenterology

## 2021-06-22 ENCOUNTER — Other Ambulatory Visit: Payer: Self-pay

## 2021-06-22 ENCOUNTER — Inpatient Hospital Stay: Payer: BC Managed Care – PPO | Attending: Hematology | Admitting: Hematology

## 2021-06-22 VITALS — BP 126/92 | HR 104 | Temp 98.4°F | Resp 16 | Wt 160.5 lb

## 2021-06-22 DIAGNOSIS — D509 Iron deficiency anemia, unspecified: Secondary | ICD-10-CM

## 2021-06-22 DIAGNOSIS — D500 Iron deficiency anemia secondary to blood loss (chronic): Secondary | ICD-10-CM

## 2021-06-22 DIAGNOSIS — C7A8 Other malignant neuroendocrine tumors: Secondary | ICD-10-CM | POA: Insufficient documentation

## 2021-06-22 DIAGNOSIS — N920 Excessive and frequent menstruation with regular cycle: Secondary | ICD-10-CM | POA: Diagnosis not present

## 2021-06-22 DIAGNOSIS — D508 Other iron deficiency anemias: Secondary | ICD-10-CM

## 2021-06-22 DIAGNOSIS — R197 Diarrhea, unspecified: Secondary | ICD-10-CM | POA: Insufficient documentation

## 2021-06-22 LAB — CBC WITH DIFFERENTIAL (CANCER CENTER ONLY)
Abs Immature Granulocytes: 0.03 10*3/uL (ref 0.00–0.07)
Basophils Absolute: 0.1 10*3/uL (ref 0.0–0.1)
Basophils Relative: 1 %
Eosinophils Absolute: 0.2 10*3/uL (ref 0.0–0.5)
Eosinophils Relative: 2 %
HCT: 41.9 % (ref 36.0–46.0)
Hemoglobin: 14.3 g/dL (ref 12.0–15.0)
Immature Granulocytes: 0 %
Lymphocytes Relative: 26 %
Lymphs Abs: 1.8 10*3/uL (ref 0.7–4.0)
MCH: 29.9 pg (ref 26.0–34.0)
MCHC: 34.1 g/dL (ref 30.0–36.0)
MCV: 87.7 fL (ref 80.0–100.0)
Monocytes Absolute: 0.9 10*3/uL (ref 0.1–1.0)
Monocytes Relative: 12 %
Neutro Abs: 4 10*3/uL (ref 1.7–7.7)
Neutrophils Relative %: 59 %
Platelet Count: 300 10*3/uL (ref 150–400)
RBC: 4.78 MIL/uL (ref 3.87–5.11)
RDW: 16.6 % — ABNORMAL HIGH (ref 11.5–15.5)
WBC Count: 6.9 10*3/uL (ref 4.0–10.5)
nRBC: 0 % (ref 0.0–0.2)

## 2021-06-22 LAB — IRON AND IRON BINDING CAPACITY (CC-WL,HP ONLY)
Iron: 46 ug/dL (ref 28–170)
Saturation Ratios: 15 % (ref 10.4–31.8)
TIBC: 300 ug/dL (ref 250–450)
UIBC: 254 ug/dL (ref 148–442)

## 2021-06-22 LAB — FERRITIN: Ferritin: 27 ng/mL (ref 11–307)

## 2021-06-22 NOTE — Progress Notes (Signed)
Amanda Murray   [phone] [phone]   Clinic Follow up Note   Patient Care Team: Biagio Borg, MD as PCP - General (Internal Medicine) Gregor Hams, MD as Consulting Physician (Family Medicine) Truitt Merle, MD as Consulting Physician (Hematology) Aloha Gell, MD as Consulting Physician (Obstetrics and Gynecology)  Date of Service:  06/22/2021  CHIEF COMPLAINT: f/u of iron deficient anemia  CURRENT THERAPY:  IV Venofer as needed if ferritin<50, last 02/14/21  ASSESSMENT & PLAN:  Amanda Murray is a 46 y.o. female with   1. Iron Deficient Anemia, likely secondary to menorrhagia -She has worsening anemia since October 2021.  CBC on 12/25/20 showed hemoglobin 9.3 with MCV 67.9.. Further work up showed iron 11, sat 2%, and ferritin 3 (01/09/21), which is consistent with iron deficient anemia. -has history of heavy periods, and more recently epistaxis. She also has fatigue, SOB, joint pain, and diarrhea.  -Her GI work-up, including EGD, colonoscopy and small bowel capsule endoscopy in 03/2021 were all negative. -I discussed her iron deficiency is likely from menorrhagia, may be related to her chronic diarrhea also.  -labs reviewed, her hgb, iron, and ferritin are WNL today. Her iron and ferritin are on the low side of normal. Given her ferritin is <50, we will schedule IV Venofer for her in the near future. -she will return for repeat labs every 2 months and f/u in 6 months.   2. Neuroendocrine tumor of LUL -found on work up for SOB. Chest CT showed 1.1 cm LUL nodule, possibly endobronchial. -bronchoscopy on 12/25/20 by Dr. Lamonte Sakai showed neuroendocrine tumor -she has not followed with Dr. Lamonte Sakai since her biopsy in 12/2020, will reach out to Dr. Lamonte Sakai     PLAN: -schedule IV Venofer 400 mg in near future -lab every 2 months x6 -f/u with NP Lacie in 1 year  *IV Venofer 400mg  1-3 doses if ferritin <50   No problem-specific Assessment & Plan notes found  for this encounter.   INTERVAL HISTORY:  Amanda Murray is here for a follow up of anemia. She was last seen by me on 03/21/21. She presents to the clinic alone. She reports continued fatigue, worse when she doesn't take her adderall. She has been very emotional lately and notes she lost her job due to her recent stress about the diarrhea. She became tearful in the office today talking about her recent mental struggles. She expressed her frustrations with how her mood has also played a role in her difficulty in getting the care she needs.   All other systems were reviewed with the patient and are negative.  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   UTI (lower urinary tract infection)     SURGICAL HISTORY: Past Surgical History:  Procedure Laterality Date   BRONCHIAL BIOPSY  12/25/2020   Procedure: BRONCHIAL BIOPSIES;  Surgeon: Collene Gobble, MD;  Location: [place] [facility];  Service: Pulmonary;;   BRONCHIAL NEEDLE ASPIRATION BIOPSY  12/25/2020   Procedure: BRONCHIAL NEEDLE ASPIRATION BIOPSIES;  Surgeon: Collene Gobble, MD;  Location: [place] [facility];  Service: Pulmonary;;   COLONOSCOPY     UPPER GASTROINTESTINAL ENDOSCOPY     VIDEO BRONCHOSCOPY WITH ENDOBRONCHIAL NAVIGATION N/A 12/25/2020   Procedure: ROBOTIC BRONCHOSCOPY WITH ENDOBRONCHIAL NAVIGATION;  Surgeon: Collene Gobble, MD;  Location: [facility] ENDOSCOPY;  Service: Pulmonary;  Laterality: N/A;   VIDEO BRONCHOSCOPY WITH RADIAL ENDOBRONCHIAL ULTRASOUND  12/25/2020   Procedure: RADIAL ENDOBRONCHIAL ULTRASOUND;  Surgeon: Collene Gobble, MD;  Location: [place] ENDOSCOPY;  Service: Pulmonary;;   WRIST SURGERY Right     I have reviewed the social history and family history with the patient and they are unchanged from previous note.  ALLERGIES:  is  allergic to doxycycline and morphine and related.  MEDICATIONS:  Current Outpatient Medications  Medication Sig Dispense Refill   amphetamine-dextroamphetamine (ADDERALL XR) 30 MG 24 hr capsule Take 30 mg by mouth daily.     Cholecalciferol (VITAMIN D-3) 25 MCG (1000 UT) CAPS Take 1,000 Units by mouth daily. Soft gel     clonazePAM (KLONOPIN) 0.5 MG tablet Take 0.5 mg by mouth 2 (two) times daily as needed for anxiety.     DULoxetine (CYMBALTA) 60 MG capsule Take 60 mg by mouth daily.     folic acid (FOLVITE) 1 MG tablet Take 1 mg by mouth daily.     gabapentin (NEURONTIN) 800 MG tablet Take 800 mg by mouth 3 (three) times daily.     lamoTRIgine (LAMICTAL) 150 MG tablet Take 150 mg by mouth daily.     nortriptyline (PAMELOR) 25 MG capsule Take 1 capsule (25 mg total) by mouth at bedtime. 30 capsule 2   traZODone (DESYREL) 50 MG tablet Take 50 mg by mouth at bedtime as needed for sleep.     valACYclovir (VALTREX) 1000 MG tablet TAKE 1 TABLET(1000 MG) BY MOUTH TWICE DAILY (Patient taking differently: 1,000 mg 2 (two) times daily. As needed) 14 tablet 2   No current facility-administered medications for this visit.    PHYSICAL EXAMINATION: ECOG PERFORMANCE STATUS: 1 - Symptomatic but completely ambulatory  Vitals:   06/22/21 1158  BP: (!) 126/92  Pulse: (!) 104  Resp: 16  Temp: 98.4 F (36.9 C)  SpO2: 97%   Wt Readings from Last 3 Encounters:  06/22/21 160 lb 8 oz (72.8 kg)  06/18/21 166 lb (75.3 kg)  06/08/21 162 lb 4.8 oz (73.6 kg)     GENERAL:alert, no distress and comfortable SKIN: skin color normal, no rashes or significant lesions EYES: normal, Conjunctiva are pink and non-injected, sclera clear  NEURO: alert & oriented x 3 with fluent speech  LABORATORY DATA:  I have reviewed the data as listed CBC Latest Ref Rng & Units 06/22/2021 05/18/2021 04/20/2021  WBC 4.0 - 10.5 K/uL 6.9 6.0 7.2  Hemoglobin 12.0 - 15.0 g/dL 14.3 14.0 14.5  Hematocrit 36.0 - 46.0 % 41.9 42.9 45.8   Platelets 150 - 400 K/uL 300 262 235     CMP Latest Ref Rng & Units 11/23/2020 03/09/2020 02/09/2019  Glucose 70 - 99 mg/dL 104(H) 88 91  BUN 6 - 23 mg/dL 8 12 16   Creatinine 0.40 - 1.20 mg/dL 0.62 0.71 0.79  Sodium 135 - 145 mEq/L 139 137 136  Potassium 3.5 - 5.1 mEq/L 3.8 4.0 4.2  Chloride 96 - 112 mEq/L 107 105 104  CO2 19 - 32 mEq/L 24 28 25   Calcium 8.4 - 10.5 mg/dL 8.8 8.8 9.6  Total Protein 6.0 - 8.3 g/dL 7.2 6.9 7.4  Total Bilirubin 0.2 - 1.2 mg/dL 0.3 0.2 0.3  Alkaline Phos 39 - 117 U/L 79 66 67  AST 0 - 37 U/L 18 18 15   ALT 0 - 35 U/L 21 25 14       RADIOGRAPHIC  STUDIES: I have personally reviewed the radiological images as listed and agreed with the findings in the report. No results found.    No orders of the defined types were placed in this encounter.  All questions were answered. The patient knows to call the clinic with any problems, questions or concerns. No barriers to learning was detected. The total time spent in the appointment was 25 minutes.     Truitt Merle, MD 06/22/2021   I, Wilburn Mylar, am acting as scribe for Truitt Merle, MD.   I have reviewed the above documentation for accuracy and completeness, and I agree with the above.

## 2021-06-23 ENCOUNTER — Encounter: Payer: Self-pay | Admitting: Hematology

## 2021-06-25 ENCOUNTER — Other Ambulatory Visit: Payer: Self-pay

## 2021-06-25 ENCOUNTER — Telehealth: Payer: Self-pay | Admitting: Hematology

## 2021-06-25 NOTE — Telephone Encounter (Signed)
Unable to leave message with follow-up appointments per 2/10 los. Mailed calendar.

## 2021-06-25 NOTE — Telephone Encounter (Signed)
Sch per 2/13 inbasket, unable to leave message,  mailed calendar

## 2021-06-27 ENCOUNTER — Telehealth: Payer: Self-pay | Admitting: Gastroenterology

## 2021-06-27 NOTE — Telephone Encounter (Signed)
Patient dismissed from [facility] by Wilfrid Lund, MD, effective 06/15/21. Dismissal Letter sent out by 1st class mail. [employer]

## 2021-07-04 ENCOUNTER — Telehealth: Payer: BC Managed Care – PPO | Admitting: Family

## 2021-07-04 DIAGNOSIS — R6889 Other general symptoms and signs: Secondary | ICD-10-CM

## 2021-07-04 MED ORDER — BENZONATATE 100 MG PO CAPS: 100.0000 mg | ORAL_CAPSULE | Freq: Three times a day (TID) | ORAL | 0 refills | Status: AC | PRN

## 2021-07-04 MED ORDER — OSELTAMIVIR PHOSPHATE 75 MG PO CAPS: 75.0000 mg | ORAL_CAPSULE | Freq: Two times a day (BID) | ORAL | 0 refills | Status: AC

## 2021-07-04 NOTE — Progress Notes (Signed)
Virtual Visit Consent   Amanda Murray, you are scheduled for a virtual visit with a [facility] provider today.     Just as with appointments in the office, your consent must be obtained to participate.  Your consent will be active for this visit and any virtual visit you may have with one of our providers in the next 365 days.     If you have a MyChart account, a copy of this consent can be sent to you electronically.  All virtual visits are billed to your insurance company just like a traditional visit in the office.    As this is a virtual visit, video technology does not allow for your provider to perform a traditional examination.  This may limit your provider's ability to fully assess your condition.  If your provider identifies any concerns that need to be evaluated in person or the need to arrange testing (such as labs, EKG, etc.), we will make arrangements to do so.     Although advances in technology are sophisticated, we cannot ensure that it will always work on either your end or our end.  If the connection with a video visit is poor, the visit may have to be switched to a telephone visit.  With either a video or telephone visit, we are not always able to ensure that we have a secure connection.     I need to obtain your verbal consent now.   Are you willing to proceed with your visit today?    Amanda Murray has provided verbal consent on 07/04/2021 for a virtual visit (video or telephone).   Evelina Dun, FNP   Date: 07/04/2021 10:26 AM   Virtual Visit via Video Note   I, Evelina Dun, connected with  Amanda Murray  ([id number], 10-09-1975) on 07/04/21 at 10:30 AM EST by a video-enabled telemedicine application and verified that I am speaking with the correct person using two identifiers.  Location: Patient: Virtual Visit Location Patient: Home Provider: Virtual Visit Location Provider: Home Office   I discussed the limitations of evaluation and management by telemedicine and the  availability of in person appointments. The patient expressed understanding and agreed to proceed.    History of Present Illness: Amanda Murray is a 46 y.o. who identifies as a female who was assigned female at birth, and is being seen today for flu like symptoms that started yesterday.   HPI: URI  This is a new problem. The current episode started yesterday. The problem has been gradually worsening. Associated symptoms include congestion, coughing, nausea, rhinorrhea, sinus pain, sneezing and wheezing. Pertinent negatives include no ear pain, headaches or sore throat. Associated symptoms comments: Chills, Back pain. The treatment provided mild relief.   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/2019   Allergic rhinitis 02/09/2019   Hyperglycemia 02/09/2019   HLD (hyperlipidemia) 02/09/2019   Anemia 02/09/2019   Alcohol use disorder, mild, in  early remission 10/27/2017   Major depressive disorder, 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/2017   Severe recurrent major depression without psychotic features ([place]) 06/01/2017   Panic attacks 12/18/2016   Left anterior cruciate ligament tear 06/24/2016   Hemarthrosis of left knee 06/17/2016   Possible exposure to STD 10/10/2015   Smoker 10/10/2015   Skin lesion of back 10/10/2015   Preventative health care 96/28/3662   Helicobacter pylori ab+ 09/30/2013   Recurrent cold sores  09/30/2013   Anxiety and depression    Migraines     Allergies:  Allergies  Allergen Reactions   Doxycycline Other (See Comments)   Morphine And Related Nausea And Vomiting   Medications:  Current Outpatient Medications:    benzonatate (TESSALON PERLES) 100 MG capsule, Take 1 capsule (100 mg total) by mouth 3 (three) times daily as needed., Disp: 20 capsule, Rfl: 0   oseltamivir (TAMIFLU) 75 MG capsule, Take 1 capsule (75 mg total) by mouth 2 (two) times daily., Disp: 10 capsule, Rfl: 0   amphetamine-dextroamphetamine (ADDERALL XR) 30 MG 24 hr capsule, Take 30 mg by mouth daily., Disp: , Rfl:    Cholecalciferol (VITAMIN D-3) 25 MCG (1000 UT) CAPS, Take 1,000 Units by mouth daily. Soft gel, Disp: , Rfl:    clonazePAM (KLONOPIN) 0.5 MG tablet, Take 0.5 mg by mouth 2 (two) times daily as needed for anxiety., Disp: , Rfl:    DULoxetine (CYMBALTA) 60 MG capsule, Take 60 mg by mouth daily., Disp: , Rfl:    folic acid (FOLVITE) 1 MG tablet, Take 1 mg by mouth daily., Disp: , Rfl:    gabapentin (NEURONTIN) 800 MG tablet, Take 800 mg by mouth 3 (three) times daily., Disp: , Rfl:    lamoTRIgine (LAMICTAL) 150 MG tablet, Take 150 mg by mouth daily., Disp: , Rfl:    nortriptyline (PAMELOR) 25 MG capsule, Take 1 capsule (25 mg total) by mouth at bedtime., Disp: 30 capsule, Rfl: 2   traZODone (DESYREL) 50 MG tablet, Take 50 mg by mouth at bedtime as needed for sleep., Disp: , Rfl:    valACYclovir (VALTREX) 1000 MG tablet, TAKE 1 TABLET(1000 MG) BY MOUTH TWICE DAILY (Patient taking differently: 1,000 mg 2 (two) times daily. As needed), Disp: 14 tablet, Rfl: 2  Observations/Objective: Patient is well-developed, well-nourished in no acute distress.  Resting comfortably  at home.  Head is normocephalic, atraumatic.  No labored breathing.  Speech is clear and coherent with logical content.  Patient is alert and oriented at baseline.  Nasal congestion   Assessment and Plan: 1. Flu-like symptoms -  oseltamivir (TAMIFLU) 75 MG capsule; Take 1 capsule (75 mg total) by mouth 2 (two) times daily.  Dispense: 10 capsule; Refill: 0 - benzonatate (TESSALON PERLES) 100 MG capsule; Take 1 capsule (100 mg total) by mouth 3 (three) times daily as needed.  Dispense: 20 capsule; Refill: 0  PT will take COVID test to rule out . If positive will let us know.  Force fluids  Rest Tylenol and motrin  Work note Follow up if symptoms worsen or do not improve   Follow Up Instructions: I discussed the assessment and treatment plan with the patient. The patient was provided an opportunity to ask questions and all were answered. The patient agreed with the plan and demonstrated an understanding of the instructions.  A copy of instructions were sent to the patient via MyChart unless otherwise noted below.     The patient was advised to call back or seek an  in-person evaluation if the symptoms worsen or if the condition fails to improve as anticipated.  Time:  I spent 8 minutes with the patient via telehealth technology discussing the above problems/concerns.    Evelina Dun, FNP

## 2021-07-05 ENCOUNTER — Encounter: Payer: Self-pay | Admitting: Internal Medicine

## 2021-07-05 ENCOUNTER — Encounter: Payer: Self-pay | Admitting: Hematology

## 2021-07-05 ENCOUNTER — Encounter: Payer: Self-pay | Admitting: Family Medicine

## 2021-07-05 DIAGNOSIS — M549 Dorsalgia, unspecified: Secondary | ICD-10-CM

## 2021-07-06 ENCOUNTER — Encounter: Payer: Self-pay | Admitting: Nurse Practitioner

## 2021-07-06 ENCOUNTER — Encounter: Payer: Self-pay | Admitting: Internal Medicine

## 2021-07-06 ENCOUNTER — Other Ambulatory Visit: Payer: Self-pay

## 2021-07-06 ENCOUNTER — Inpatient Hospital Stay: Payer: BC Managed Care – PPO

## 2021-07-06 ENCOUNTER — Other Ambulatory Visit (INDEPENDENT_AMBULATORY_CARE_PROVIDER_SITE_OTHER): Payer: BC Managed Care – PPO

## 2021-07-06 DIAGNOSIS — D509 Iron deficiency anemia, unspecified: Secondary | ICD-10-CM

## 2021-07-06 DIAGNOSIS — C7A8 Other malignant neuroendocrine tumors: Secondary | ICD-10-CM | POA: Diagnosis not present

## 2021-07-06 DIAGNOSIS — M549 Dorsalgia, unspecified: Secondary | ICD-10-CM

## 2021-07-06 DIAGNOSIS — D500 Iron deficiency anemia secondary to blood loss (chronic): Secondary | ICD-10-CM

## 2021-07-06 DIAGNOSIS — N920 Excessive and frequent menstruation with regular cycle: Secondary | ICD-10-CM | POA: Diagnosis not present

## 2021-07-06 DIAGNOSIS — R197 Diarrhea, unspecified: Secondary | ICD-10-CM | POA: Diagnosis not present

## 2021-07-06 LAB — CBC WITH DIFFERENTIAL (CANCER CENTER ONLY)
Abs Immature Granulocytes: 0.02 10*3/uL (ref 0.00–0.07)
Basophils Absolute: 0.1 10*3/uL (ref 0.0–0.1)
Basophils Relative: 2 %
Eosinophils Absolute: 0.2 10*3/uL (ref 0.0–0.5)
Eosinophils Relative: 2 %
HCT: 41.6 % (ref 36.0–46.0)
Hemoglobin: 13.7 g/dL (ref 12.0–15.0)
Immature Granulocytes: 0 %
Lymphocytes Relative: 19 %
Lymphs Abs: 1.4 10*3/uL (ref 0.7–4.0)
MCH: 30.4 pg (ref 26.0–34.0)
MCHC: 32.9 g/dL (ref 30.0–36.0)
MCV: 92.2 fL (ref 80.0–100.0)
Monocytes Absolute: 0.6 10*3/uL (ref 0.1–1.0)
Monocytes Relative: 8 %
Neutro Abs: 5.2 10*3/uL (ref 1.7–7.7)
Neutrophils Relative %: 69 %
Platelet Count: 248 10*3/uL (ref 150–400)
RBC: 4.51 MIL/uL (ref 3.87–5.11)
RDW: 16 % — ABNORMAL HIGH (ref 11.5–15.5)
WBC Count: 7.5 10*3/uL (ref 4.0–10.5)
nRBC: 0 % (ref 0.0–0.2)

## 2021-07-06 LAB — URINALYSIS, ROUTINE W REFLEX MICROSCOPIC
Bilirubin Urine: NEGATIVE
Ketones, ur: 15 — AB
Leukocytes,Ua: NEGATIVE
Nitrite: NEGATIVE
Specific Gravity, Urine: >=1.03 — AB (ref 1.000–1.030)
Total Protein, Urine: NEGATIVE
Urine Glucose: NEGATIVE
Urobilinogen, UA: 1 (ref 0.0–1.0)
pH: 5 (ref 5.0–8.0)

## 2021-07-06 LAB — IRON AND IRON BINDING CAPACITY (CC-WL,HP ONLY)
Iron: 135 ug/dL (ref 28–170)
Saturation Ratios: 46 % — ABNORMAL HIGH (ref 10.4–31.8)
TIBC: 295 ug/dL (ref 250–450)
UIBC: 160 ug/dL (ref 148–442)

## 2021-07-06 MED FILL — Iron Sucrose Inj 20 MG/ML (Fe Equiv): INTRAVENOUS | Qty: 20 | Status: AC

## 2021-07-06 NOTE — Progress Notes (Signed)
Called patient whom was scheduled to meet with me after lab. She states she forgot and wasn't feeling well.   Advised patient to call me on Mon 2/27 as I will be in office and we can schedule time to meet as she lives close to cancer center.  She has my card for any additional financial questions or concerns.

## 2021-07-07 ENCOUNTER — Inpatient Hospital Stay: Payer: BC Managed Care – PPO

## 2021-07-07 LAB — URINE CULTURE
MICRO NUMBER:: 13054519
Result:: NO GROWTH
SPECIMEN QUALITY:: ADEQUATE

## 2021-07-09 LAB — FERRITIN: Ferritin: 20 ng/mL (ref 11–307)

## 2021-07-11 DIAGNOSIS — F19120 Other psychoactive substance abuse with intoxication, uncomplicated: Secondary | ICD-10-CM | POA: Diagnosis not present

## 2021-07-11 DIAGNOSIS — F331 Major depressive disorder, recurrent, moderate: Secondary | ICD-10-CM | POA: Diagnosis not present

## 2021-07-13 ENCOUNTER — Telehealth: Payer: Self-pay | Admitting: Hematology

## 2021-07-13 NOTE — Telephone Encounter (Signed)
.  Called patient to schedule appointment per 3/2 inbasket, patient is aware of date and time.   ?

## 2021-07-17 DIAGNOSIS — D649 Anemia, unspecified: Secondary | ICD-10-CM | POA: Diagnosis not present

## 2021-07-17 DIAGNOSIS — R9389 Abnormal findings on diagnostic imaging of other specified body structures: Secondary | ICD-10-CM | POA: Diagnosis not present

## 2021-07-17 DIAGNOSIS — N920 Excessive and frequent menstruation with regular cycle: Secondary | ICD-10-CM | POA: Diagnosis not present

## 2021-07-17 DIAGNOSIS — N83209 Unspecified ovarian cyst, unspecified side: Secondary | ICD-10-CM | POA: Diagnosis not present

## 2021-07-18 ENCOUNTER — Inpatient Hospital Stay: Payer: BC Managed Care – PPO | Attending: Hematology

## 2021-07-18 ENCOUNTER — Other Ambulatory Visit: Payer: Self-pay

## 2021-07-18 DIAGNOSIS — F331 Major depressive disorder, recurrent, moderate: Secondary | ICD-10-CM | POA: Diagnosis not present

## 2021-07-18 DIAGNOSIS — N920 Excessive and frequent menstruation with regular cycle: Secondary | ICD-10-CM | POA: Diagnosis not present

## 2021-07-18 DIAGNOSIS — D509 Iron deficiency anemia, unspecified: Secondary | ICD-10-CM | POA: Insufficient documentation

## 2021-07-18 DIAGNOSIS — D500 Iron deficiency anemia secondary to blood loss (chronic): Secondary | ICD-10-CM

## 2021-07-18 DIAGNOSIS — F19120 Other psychoactive substance abuse with intoxication, uncomplicated: Secondary | ICD-10-CM | POA: Diagnosis not present

## 2021-07-18 LAB — FERRITIN: Ferritin: 11 ng/mL (ref 11–307)

## 2021-07-18 LAB — CBC WITH DIFFERENTIAL (CANCER CENTER ONLY)
Abs Immature Granulocytes: 0.02 10*3/uL (ref 0.00–0.07)
Basophils Absolute: 0.1 10*3/uL (ref 0.0–0.1)
Basophils Relative: 2 %
Eosinophils Absolute: 0.2 10*3/uL (ref 0.0–0.5)
Eosinophils Relative: 3 %
HCT: 42.9 % (ref 36.0–46.0)
Hemoglobin: 13.9 g/dL (ref 12.0–15.0)
Immature Granulocytes: 0 %
Lymphocytes Relative: 22 %
Lymphs Abs: 1.4 10*3/uL (ref 0.7–4.0)
MCH: 30.3 pg (ref 26.0–34.0)
MCHC: 32.4 g/dL (ref 30.0–36.0)
MCV: 93.5 fL (ref 80.0–100.0)
Monocytes Absolute: 0.6 10*3/uL (ref 0.1–1.0)
Monocytes Relative: 10 %
Neutro Abs: 4.2 10*3/uL (ref 1.7–7.7)
Neutrophils Relative %: 63 %
Platelet Count: 316 10*3/uL (ref 150–400)
RBC: 4.59 MIL/uL (ref 3.87–5.11)
RDW: 15.2 % (ref 11.5–15.5)
WBC Count: 6.5 10*3/uL (ref 4.0–10.5)
nRBC: 0 % (ref 0.0–0.2)

## 2021-07-18 LAB — IRON AND IRON BINDING CAPACITY (CC-WL,HP ONLY)
Iron: 30 ug/dL (ref 28–170)
Saturation Ratios: 9 % — ABNORMAL LOW (ref 10.4–31.8)
TIBC: 353 ug/dL (ref 250–450)
UIBC: 323 ug/dL (ref 148–442)

## 2021-07-19 ENCOUNTER — Inpatient Hospital Stay: Payer: BC Managed Care – PPO

## 2021-07-19 VITALS — BP 129/81 | HR 99 | Temp 98.1°F | Resp 16

## 2021-07-19 DIAGNOSIS — N920 Excessive and frequent menstruation with regular cycle: Secondary | ICD-10-CM | POA: Diagnosis not present

## 2021-07-19 DIAGNOSIS — D509 Iron deficiency anemia, unspecified: Secondary | ICD-10-CM | POA: Diagnosis not present

## 2021-07-19 DIAGNOSIS — D500 Iron deficiency anemia secondary to blood loss (chronic): Secondary | ICD-10-CM

## 2021-07-19 MED ORDER — SODIUM CHLORIDE 0.9 % IV SOLN: Freq: Once | INTRAVENOUS | Status: AC

## 2021-07-19 MED ORDER — SODIUM CHLORIDE 0.9 % IV SOLN
400.0000 mg | Freq: Once | INTRAVENOUS | Status: AC
  Administered 2021-07-19: 400 mg via INTRAVENOUS
  Filled 2021-07-19: qty 20

## 2021-07-19 MED ORDER — ALBUTEROL SULFATE (2.5 MG/3ML) 0.083% IN NEBU: 2.5000 mg | INHALATION_SOLUTION | Freq: Once | RESPIRATORY_TRACT | Status: DC | PRN

## 2021-07-19 MED ORDER — SODIUM CHLORIDE 0.9 % IV SOLN
400.0000 mg | Freq: Once | INTRAVENOUS | Status: DC
  Filled 2021-07-19: qty 20

## 2021-07-19 MED ORDER — ALTEPLASE 2 MG IJ SOLR: 2.0000 mg | Freq: Once | INTRAMUSCULAR | Status: DC | PRN

## 2021-07-19 MED ORDER — SODIUM CHLORIDE 0.9% FLUSH: 10.0000 mL | Freq: Once | INTRAVENOUS | Status: DC | PRN

## 2021-07-19 MED ORDER — METHYLPREDNISOLONE SODIUM SUCC 125 MG IJ SOLR: 125.0000 mg | Freq: Once | INTRAMUSCULAR | Status: DC | PRN

## 2021-07-19 MED ORDER — HEPARIN SOD (PORK) LOCK FLUSH 100 UNIT/ML IV SOLN: 500.0000 [IU] | Freq: Once | INTRAVENOUS | Status: DC | PRN

## 2021-07-19 MED ORDER — DIPHENHYDRAMINE HCL 50 MG/ML IJ SOLN: 50.0000 mg | Freq: Once | INTRAMUSCULAR | Status: DC | PRN

## 2021-07-19 MED ORDER — SODIUM CHLORIDE 0.9 % IV SOLN: Freq: Once | INTRAVENOUS | Status: DC | PRN

## 2021-07-19 MED ORDER — EPINEPHRINE 0.3 MG/0.3ML IJ SOAJ: 0.3000 mg | Freq: Once | INTRAMUSCULAR | Status: DC | PRN

## 2021-07-19 MED ORDER — HEPARIN SOD (PORK) LOCK FLUSH 100 UNIT/ML IV SOLN: 250.0000 [IU] | Freq: Once | INTRAVENOUS | Status: DC | PRN

## 2021-07-19 MED ORDER — FAMOTIDINE IN NACL 20-0.9 MG/50ML-% IV SOLN: 20.0000 mg | Freq: Once | INTRAVENOUS | Status: DC | PRN

## 2021-07-19 MED ORDER — SODIUM CHLORIDE 0.9% FLUSH: 3.0000 mL | Freq: Once | INTRAVENOUS | Status: DC | PRN

## 2021-07-19 NOTE — Patient Instructions (Signed)

## 2021-07-19 NOTE — Progress Notes (Signed)
Pt refused to stay for 30 minute post Venofer infusion observation.  ?Pt tolerated trtmt well w/out incident.  ?VSS at discharge.  ?Ambulatory to lobby.  ?

## 2021-07-20 ENCOUNTER — Encounter: Payer: Self-pay | Admitting: Internal Medicine

## 2021-07-21 NOTE — Telephone Encounter (Signed)
Hello - I think the letter is ok  - would you be able to come up with the right wording for a letter ?  thanks ?

## 2021-07-24 ENCOUNTER — Encounter: Payer: Self-pay | Admitting: Nurse Practitioner

## 2021-07-24 NOTE — Progress Notes (Signed)
Patient called to confirm appointment for today 07/24/21 at 3pm with me. ?

## 2021-07-24 NOTE — Progress Notes (Signed)
Attempted to contact patient to confirm our appointment for today at 3 as the email she sent was a little unclear. Voicemail box full and cannot leave message. ? ?She has my contact name and number for any additional financial questions or concerns. ?

## 2021-07-24 NOTE — Progress Notes (Signed)
Met with patient at registration to complete grant paperwork. ? ?Patient approved for one-time $1000 [employer] grant to assist with personal expenses while going through treatment. Discussed in detail expenses and how they are covered. She has a copy of the approval letter and expense sheet along with the Outpatient pharmacy information. She received a gift card from her grant. ? ?She has my card, gift card and paperwork in brown clasp folder for any additional financial questions or concerns. ?

## 2021-07-24 NOTE — Progress Notes (Signed)
Patient message sent confirming our appointment for today and provided my number in it as well for patient to contact me as her voicemail is full. ?

## 2021-07-25 ENCOUNTER — Encounter: Payer: Self-pay | Admitting: Family Medicine

## 2021-07-26 ENCOUNTER — Encounter: Payer: Self-pay | Admitting: Nurse Practitioner

## 2021-07-26 NOTE — Progress Notes (Signed)
Received email from patient regarding submitting expense from grant funds. ? ?Advised what was remaining after previous expense and she states she didn't recall. Advised patient to call me at her earliest convenience. ? ?Patient called and I advised of the previous expense which was a gift card which patient stated she did not receive. ? ?Advised patient I would submit the expense for the full amount.  ?

## 2021-07-28 ENCOUNTER — Encounter: Payer: Self-pay | Admitting: Hematology

## 2021-07-31 DIAGNOSIS — M25552 Pain in left hip: Secondary | ICD-10-CM | POA: Diagnosis not present

## 2021-08-01 DIAGNOSIS — N83209 Unspecified ovarian cyst, unspecified side: Secondary | ICD-10-CM | POA: Diagnosis not present

## 2021-08-01 DIAGNOSIS — Z3202 Encounter for pregnancy test, result negative: Secondary | ICD-10-CM | POA: Diagnosis not present

## 2021-08-01 DIAGNOSIS — N926 Irregular menstruation, unspecified: Secondary | ICD-10-CM | POA: Diagnosis not present

## 2021-08-01 DIAGNOSIS — Z30430 Encounter for insertion of intrauterine contraceptive device: Secondary | ICD-10-CM | POA: Diagnosis not present

## 2021-08-01 DIAGNOSIS — D649 Anemia, unspecified: Secondary | ICD-10-CM | POA: Diagnosis not present

## 2021-08-06 ENCOUNTER — Other Ambulatory Visit: Payer: Self-pay | Admitting: Family Medicine

## 2021-08-09 ENCOUNTER — Inpatient Hospital Stay: Payer: BC Managed Care – PPO

## 2021-08-09 ENCOUNTER — Encounter: Payer: Self-pay | Admitting: Hematology

## 2021-08-09 ENCOUNTER — Other Ambulatory Visit: Payer: Self-pay

## 2021-08-09 DIAGNOSIS — D509 Iron deficiency anemia, unspecified: Secondary | ICD-10-CM | POA: Diagnosis not present

## 2021-08-09 DIAGNOSIS — D500 Iron deficiency anemia secondary to blood loss (chronic): Secondary | ICD-10-CM

## 2021-08-09 DIAGNOSIS — N920 Excessive and frequent menstruation with regular cycle: Secondary | ICD-10-CM | POA: Diagnosis not present

## 2021-08-09 LAB — CBC WITH DIFFERENTIAL (CANCER CENTER ONLY)
Abs Immature Granulocytes: 0.03 10*3/uL (ref 0.00–0.07)
Basophils Absolute: 0.1 10*3/uL (ref 0.0–0.1)
Basophils Relative: 1 %
Eosinophils Absolute: 0.3 10*3/uL (ref 0.0–0.5)
Eosinophils Relative: 3 %
HCT: 43.7 % (ref 36.0–46.0)
Hemoglobin: 14.3 g/dL (ref 12.0–15.0)
Immature Granulocytes: 0 %
Lymphocytes Relative: 21 %
Lymphs Abs: 1.7 10*3/uL (ref 0.7–4.0)
MCH: 30.4 pg (ref 26.0–34.0)
MCHC: 32.7 g/dL (ref 30.0–36.0)
MCV: 93 fL (ref 80.0–100.0)
Monocytes Absolute: 0.7 10*3/uL (ref 0.1–1.0)
Monocytes Relative: 8 %
Neutro Abs: 5.1 10*3/uL (ref 1.7–7.7)
Neutrophils Relative %: 67 %
Platelet Count: 264 10*3/uL (ref 150–400)
RBC: 4.7 MIL/uL (ref 3.87–5.11)
RDW: 14.5 % (ref 11.5–15.5)
WBC Count: 7.9 10*3/uL (ref 4.0–10.5)
nRBC: 0 % (ref 0.0–0.2)

## 2021-08-09 LAB — IRON AND IRON BINDING CAPACITY (CC-WL,HP ONLY)
Iron: 130 ug/dL (ref 28–170)
Saturation Ratios: 40 % — ABNORMAL HIGH (ref 10.4–31.8)
TIBC: 329 ug/dL (ref 250–450)
UIBC: 199 ug/dL (ref 148–442)

## 2021-08-09 LAB — FERRITIN: Ferritin: 52 ng/mL (ref 11–307)

## 2021-08-23 ENCOUNTER — Other Ambulatory Visit: Payer: Self-pay

## 2021-08-23 ENCOUNTER — Inpatient Hospital Stay: Payer: BC Managed Care – PPO | Attending: Hematology

## 2021-08-23 DIAGNOSIS — F19120 Other psychoactive substance abuse with intoxication, uncomplicated: Secondary | ICD-10-CM | POA: Diagnosis not present

## 2021-08-23 DIAGNOSIS — D500 Iron deficiency anemia secondary to blood loss (chronic): Secondary | ICD-10-CM

## 2021-08-23 DIAGNOSIS — D509 Iron deficiency anemia, unspecified: Secondary | ICD-10-CM | POA: Diagnosis not present

## 2021-08-23 DIAGNOSIS — F331 Major depressive disorder, recurrent, moderate: Secondary | ICD-10-CM | POA: Diagnosis not present

## 2021-08-23 LAB — CBC WITH DIFFERENTIAL (CANCER CENTER ONLY)
Abs Immature Granulocytes: 0.04 10*3/uL (ref 0.00–0.07)
Basophils Absolute: 0.1 10*3/uL (ref 0.0–0.1)
Basophils Relative: 1 %
Eosinophils Absolute: 0.2 10*3/uL (ref 0.0–0.5)
Eosinophils Relative: 2 %
HCT: 45 % (ref 36.0–46.0)
Hemoglobin: 14.7 g/dL (ref 12.0–15.0)
Immature Granulocytes: 0 %
Lymphocytes Relative: 21 %
Lymphs Abs: 2 10*3/uL (ref 0.7–4.0)
MCH: 30.3 pg (ref 26.0–34.0)
MCHC: 32.7 g/dL (ref 30.0–36.0)
MCV: 92.8 fL (ref 80.0–100.0)
Monocytes Absolute: 0.8 10*3/uL (ref 0.1–1.0)
Monocytes Relative: 9 %
Neutro Abs: 6.4 10*3/uL (ref 1.7–7.7)
Neutrophils Relative %: 67 %
Platelet Count: 273 10*3/uL (ref 150–400)
RBC: 4.85 MIL/uL (ref 3.87–5.11)
RDW: 14.5 % (ref 11.5–15.5)
WBC Count: 9.5 10*3/uL (ref 4.0–10.5)
nRBC: 0 % (ref 0.0–0.2)

## 2021-08-23 LAB — IRON AND IRON BINDING CAPACITY (CC-WL,HP ONLY)
Iron: 114 ug/dL (ref 28–170)
Saturation Ratios: 35 % — ABNORMAL HIGH (ref 10.4–31.8)
TIBC: 330 ug/dL (ref 250–450)
UIBC: 216 ug/dL (ref 148–442)

## 2021-08-24 ENCOUNTER — Encounter: Payer: Self-pay | Admitting: Hematology

## 2021-08-24 LAB — FERRITIN: Ferritin: 34 ng/mL (ref 11–307)

## 2021-08-27 ENCOUNTER — Telehealth: Payer: Self-pay | Admitting: Hematology

## 2021-08-27 ENCOUNTER — Telehealth: Payer: Self-pay

## 2021-08-27 NOTE — Telephone Encounter (Signed)
Pt was called Per Dr. Burr Medico, regarding her ferritin level, she may get IV Venofer if she would like or to watch her levels for now. The pt is requesting to receive IV Venofer sometime soon. This LPN has sent scheduling a message regarding this. Pt verbalized understanding.  ?

## 2021-08-27 NOTE — Telephone Encounter (Signed)
.  Called patient to schedule appointment per 4/17 inbasket, patient is aware of date and time.   ?

## 2021-08-28 DIAGNOSIS — M9903 Segmental and somatic dysfunction of lumbar region: Secondary | ICD-10-CM | POA: Diagnosis not present

## 2021-08-28 DIAGNOSIS — M545 Low back pain, unspecified: Secondary | ICD-10-CM | POA: Diagnosis not present

## 2021-08-28 DIAGNOSIS — M25552 Pain in left hip: Secondary | ICD-10-CM | POA: Diagnosis not present

## 2021-08-28 DIAGNOSIS — M9902 Segmental and somatic dysfunction of thoracic region: Secondary | ICD-10-CM | POA: Diagnosis not present

## 2021-09-03 ENCOUNTER — Other Ambulatory Visit: Payer: Self-pay

## 2021-09-03 ENCOUNTER — Inpatient Hospital Stay: Payer: BC Managed Care – PPO

## 2021-09-03 VITALS — BP 127/86 | HR 68 | Temp 97.6°F | Resp 16

## 2021-09-03 DIAGNOSIS — D509 Iron deficiency anemia, unspecified: Secondary | ICD-10-CM | POA: Diagnosis not present

## 2021-09-03 DIAGNOSIS — D500 Iron deficiency anemia secondary to blood loss (chronic): Secondary | ICD-10-CM

## 2021-09-03 MED ORDER — SODIUM CHLORIDE 0.9 % IV SOLN
400.0000 mg | Freq: Once | INTRAVENOUS | Status: AC
  Administered 2021-09-03: 400 mg via INTRAVENOUS
  Filled 2021-09-03: qty 20

## 2021-09-03 MED ORDER — SODIUM CHLORIDE 0.9 % IV SOLN: Freq: Once | INTRAVENOUS | Status: AC

## 2021-09-03 NOTE — Patient Instructions (Signed)

## 2021-09-12 ENCOUNTER — Encounter: Payer: Self-pay | Admitting: Emergency Medicine

## 2021-09-12 DIAGNOSIS — Z30431 Encounter for routine checking of intrauterine contraceptive device: Secondary | ICD-10-CM | POA: Diagnosis not present

## 2021-09-12 DIAGNOSIS — N946 Dysmenorrhea, unspecified: Secondary | ICD-10-CM | POA: Diagnosis not present

## 2021-09-12 NOTE — Telephone Encounter (Signed)
Dr. Byrum, please see mychart message sent by pt and advise. 

## 2021-09-12 NOTE — Progress Notes (Signed)
? ? ?  Thank you for your message seeking medical advice.* My assessment and recommendation are as follows: ? ?I have plan to repeat her CT scan of the chest without contrast in July to follow carcinoid.  Please make sure that the scan is ordered and that she has follow-up with me in July to review ? ?Sincerely,  ?Collene Gobble, MD  ? ? ?*This exchange required the expertise of a doctor, nurse practitioner, physician assistant, optometrist or certified nurse midwife and qualifies as a Medical Advice Message, please visit [email or web address] for more details. [facility] will bill your insurance on your behalf; copays and deductibles may apply. Questions? Reply to this message.  ? ? ?Please see the MyChart message reply(ies) for my assessment and plan.  ?  ?This patient gave consent for this Medical Advice Message and is aware that it may result in a bill to [employer], as well as the possibility of receiving a bill for a co-payment or deductible. They are an established patient, but are not seeking medical advice exclusively about a problem treated during an in person or video visit in the last seven days. I did not recommend an in person or video visit within seven days of my reply.  ?  ?I spent a total of 6 minutes cumulative time within 7 days through [employer]. ? ?Collene Gobble, MD  ? ? ?

## 2021-09-14 DIAGNOSIS — F411 Generalized anxiety disorder: Secondary | ICD-10-CM | POA: Diagnosis not present

## 2021-09-14 DIAGNOSIS — F331 Major depressive disorder, recurrent, moderate: Secondary | ICD-10-CM | POA: Diagnosis not present

## 2021-09-14 DIAGNOSIS — F3132 Bipolar disorder, current episode depressed, moderate: Secondary | ICD-10-CM | POA: Diagnosis not present

## 2021-09-14 DIAGNOSIS — F19120 Other psychoactive substance abuse with intoxication, uncomplicated: Secondary | ICD-10-CM | POA: Diagnosis not present

## 2021-09-21 DIAGNOSIS — F411 Generalized anxiety disorder: Secondary | ICD-10-CM | POA: Diagnosis not present

## 2021-09-21 DIAGNOSIS — F3132 Bipolar disorder, current episode depressed, moderate: Secondary | ICD-10-CM | POA: Diagnosis not present

## 2021-09-26 ENCOUNTER — Other Ambulatory Visit: Payer: Self-pay

## 2021-09-26 ENCOUNTER — Inpatient Hospital Stay: Payer: BC Managed Care – PPO | Attending: Hematology

## 2021-09-26 DIAGNOSIS — D500 Iron deficiency anemia secondary to blood loss (chronic): Secondary | ICD-10-CM

## 2021-09-26 DIAGNOSIS — D509 Iron deficiency anemia, unspecified: Secondary | ICD-10-CM | POA: Insufficient documentation

## 2021-09-26 LAB — CBC WITH DIFFERENTIAL (CANCER CENTER ONLY)
Abs Immature Granulocytes: 0.02 10*3/uL (ref 0.00–0.07)
Basophils Absolute: 0.1 10*3/uL (ref 0.0–0.1)
Basophils Relative: 1 %
Eosinophils Absolute: 0.2 10*3/uL (ref 0.0–0.5)
Eosinophils Relative: 2 %
HCT: 44.7 % (ref 36.0–46.0)
Hemoglobin: 15.3 g/dL — ABNORMAL HIGH (ref 12.0–15.0)
Immature Granulocytes: 0 %
Lymphocytes Relative: 25 %
Lymphs Abs: 1.7 10*3/uL (ref 0.7–4.0)
MCH: 31.5 pg (ref 26.0–34.0)
MCHC: 34.2 g/dL (ref 30.0–36.0)
MCV: 92.2 fL (ref 80.0–100.0)
Monocytes Absolute: 0.5 10*3/uL (ref 0.1–1.0)
Monocytes Relative: 8 %
Neutro Abs: 4.2 10*3/uL (ref 1.7–7.7)
Neutrophils Relative %: 64 %
Platelet Count: 252 10*3/uL (ref 150–400)
RBC: 4.85 MIL/uL (ref 3.87–5.11)
RDW: 14.4 % (ref 11.5–15.5)
WBC Count: 6.6 10*3/uL (ref 4.0–10.5)
nRBC: 0 % (ref 0.0–0.2)

## 2021-09-26 LAB — IRON AND IRON BINDING CAPACITY (CC-WL,HP ONLY)
Iron: 72 ug/dL (ref 28–170)
Saturation Ratios: 24 % (ref 10.4–31.8)
TIBC: 294 ug/dL (ref 250–450)
UIBC: 222 ug/dL

## 2021-09-27 ENCOUNTER — Encounter: Payer: Self-pay | Admitting: Hematology

## 2021-09-27 LAB — FERRITIN: Ferritin: 51 ng/mL (ref 11–307)

## 2021-10-02 DIAGNOSIS — M25552 Pain in left hip: Secondary | ICD-10-CM | POA: Diagnosis not present

## 2021-10-02 DIAGNOSIS — M5451 Vertebrogenic low back pain: Secondary | ICD-10-CM | POA: Diagnosis not present

## 2021-10-02 DIAGNOSIS — M9904 Segmental and somatic dysfunction of sacral region: Secondary | ICD-10-CM | POA: Diagnosis not present

## 2021-10-02 DIAGNOSIS — M9903 Segmental and somatic dysfunction of lumbar region: Secondary | ICD-10-CM | POA: Diagnosis not present

## 2021-10-04 IMAGING — DX DG CHEST 2V
2 series · 2 of 2 positions shown · non-contrast
Comparison: None.

CLINICAL DATA: Fatigue, cough, and shortness of breath for 1 month.

EXAM:
CHEST - 2 VIEW

[chest pa]
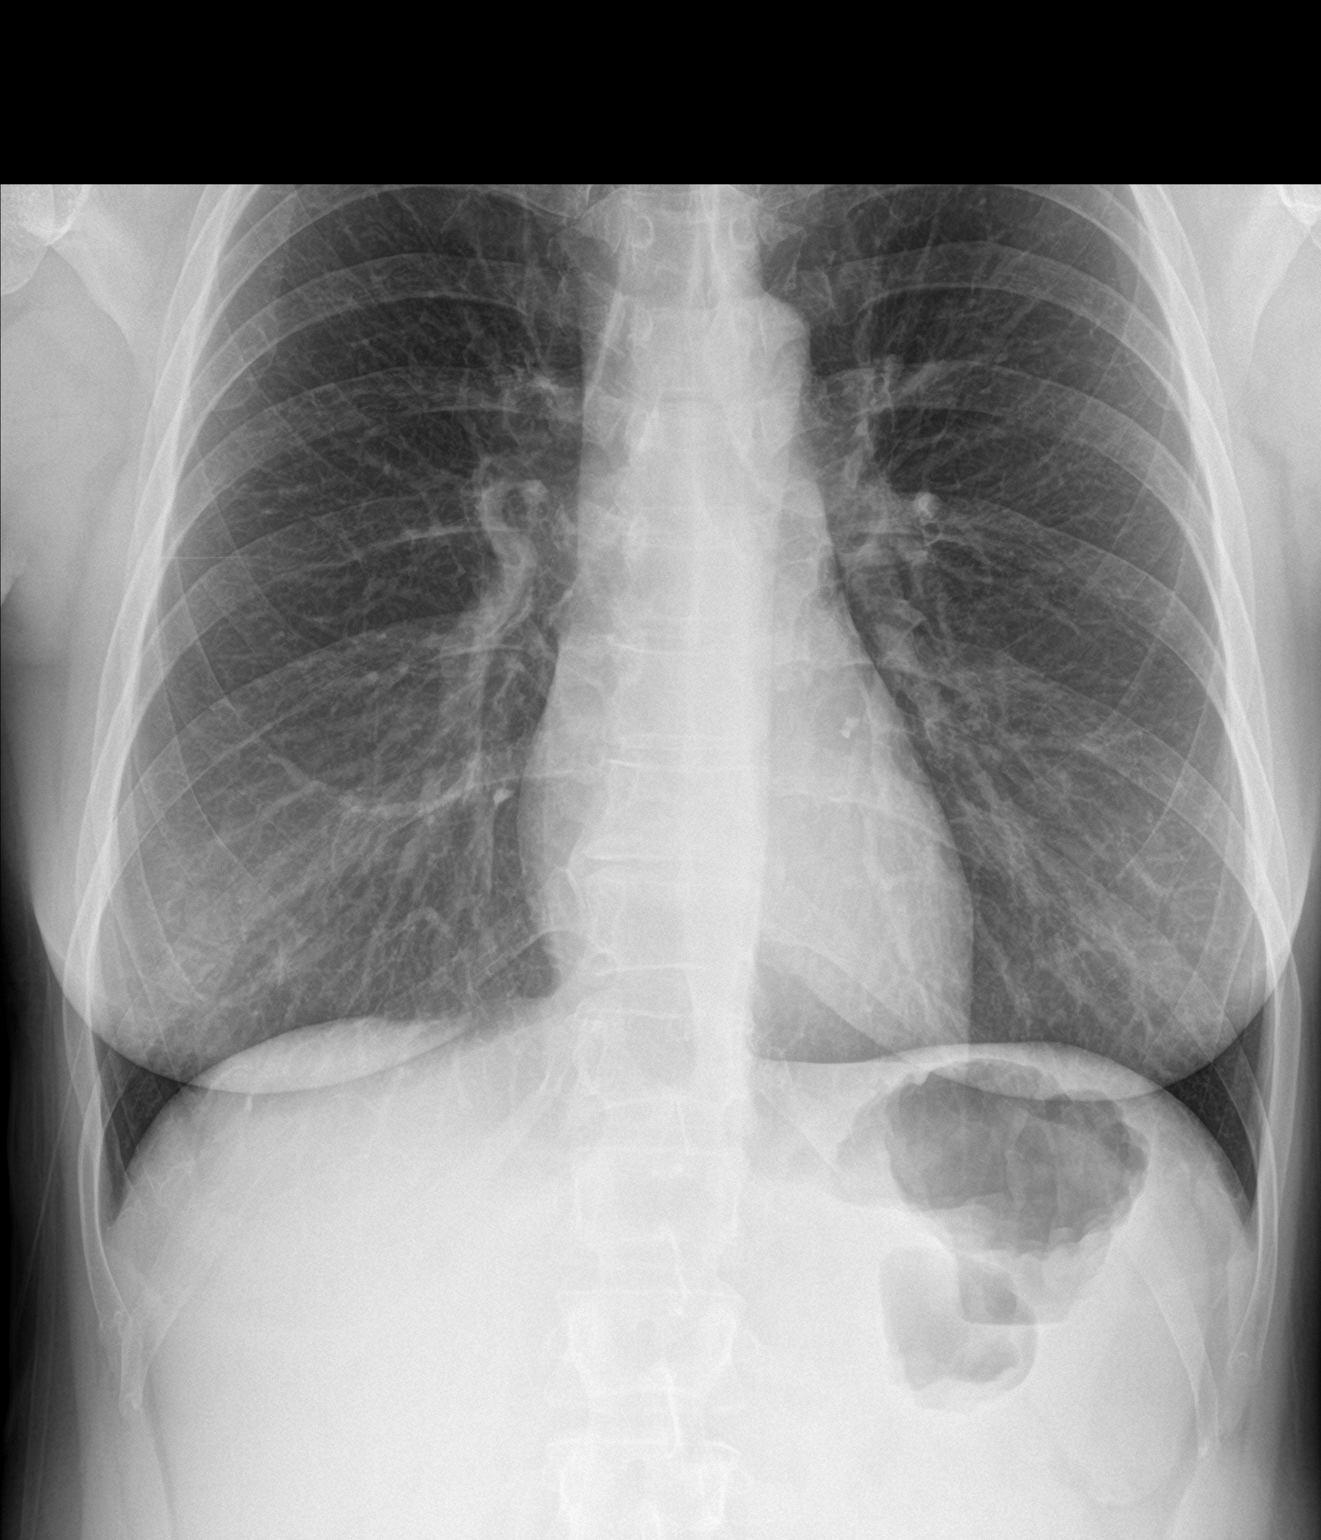

[chest lat]
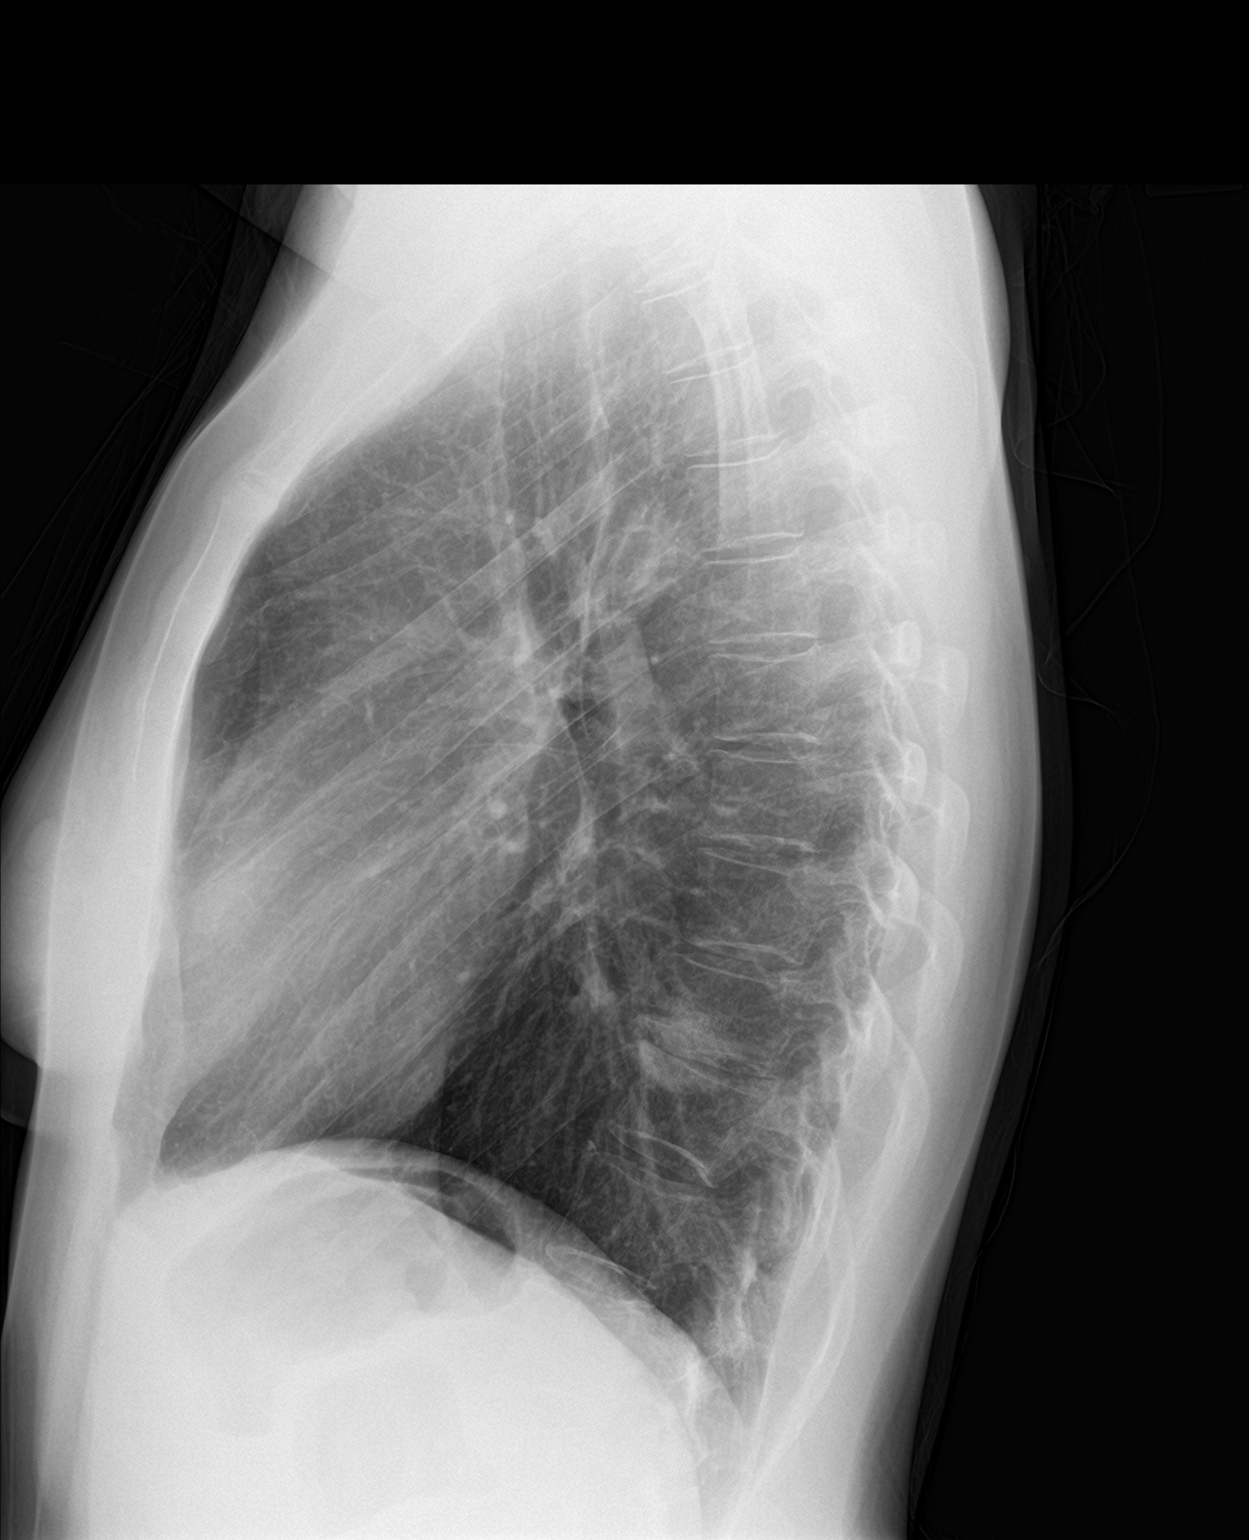

[2 of 2 positions shown; findings below may reference images not displayed]

FINDINGS: The heart size and mediastinal contours are within normal limits. A
focal ill-defined nodular opacity is seen in the posterior lower
lung on the lateral projection, not well seen on the PA view. This
could represent a focal area of infiltrate or pulmonary nodule. The
lungs are otherwise clear. No evidence of pleural effusion.
IMPRESSION: Possible focal infiltrate or pulmonary nodule in the posterior lower
lung field on the lateral view. Chest CT without contrast is
recommended for further evaluation.

These results will be called to the ordering clinician or
representative by the Radiologist Assistant, and communication
documented in the PACS or [REDACTED].

## 2021-10-11 DIAGNOSIS — F19120 Other psychoactive substance abuse with intoxication, uncomplicated: Secondary | ICD-10-CM | POA: Diagnosis not present

## 2021-10-11 DIAGNOSIS — F331 Major depressive disorder, recurrent, moderate: Secondary | ICD-10-CM | POA: Diagnosis not present

## 2021-10-12 ENCOUNTER — Other Ambulatory Visit: Payer: Self-pay

## 2021-10-12 DIAGNOSIS — D509 Iron deficiency anemia, unspecified: Secondary | ICD-10-CM

## 2021-10-12 DIAGNOSIS — D500 Iron deficiency anemia secondary to blood loss (chronic): Secondary | ICD-10-CM

## 2021-10-15 ENCOUNTER — Inpatient Hospital Stay: Payer: BC Managed Care – PPO | Attending: Hematology

## 2021-10-15 ENCOUNTER — Other Ambulatory Visit: Payer: Self-pay

## 2021-10-15 DIAGNOSIS — D500 Iron deficiency anemia secondary to blood loss (chronic): Secondary | ICD-10-CM | POA: Insufficient documentation

## 2021-10-15 DIAGNOSIS — N920 Excessive and frequent menstruation with regular cycle: Secondary | ICD-10-CM | POA: Insufficient documentation

## 2021-10-15 DIAGNOSIS — D509 Iron deficiency anemia, unspecified: Secondary | ICD-10-CM

## 2021-10-15 LAB — IRON AND IRON BINDING CAPACITY (CC-WL,HP ONLY)
Iron: 53 ug/dL (ref 28–170)
Saturation Ratios: 18 % (ref 10.4–31.8)
TIBC: 298 ug/dL (ref 250–450)
UIBC: 245 ug/dL (ref 148–442)

## 2021-10-15 LAB — CBC WITH DIFFERENTIAL (CANCER CENTER ONLY)
Abs Immature Granulocytes: 0.02 10*3/uL (ref 0.00–0.07)
Basophils Absolute: 0.1 10*3/uL (ref 0.0–0.1)
Basophils Relative: 1 %
Eosinophils Absolute: 0.2 10*3/uL (ref 0.0–0.5)
Eosinophils Relative: 3 %
HCT: 46.8 % — ABNORMAL HIGH (ref 36.0–46.0)
Hemoglobin: 16 g/dL — ABNORMAL HIGH (ref 12.0–15.0)
Immature Granulocytes: 0 %
Lymphocytes Relative: 25 %
Lymphs Abs: 1.9 10*3/uL (ref 0.7–4.0)
MCH: 31.9 pg (ref 26.0–34.0)
MCHC: 34.2 g/dL (ref 30.0–36.0)
MCV: 93.4 fL (ref 80.0–100.0)
Monocytes Absolute: 0.7 10*3/uL (ref 0.1–1.0)
Monocytes Relative: 10 %
Neutro Abs: 4.6 10*3/uL (ref 1.7–7.7)
Neutrophils Relative %: 61 %
Platelet Count: 245 10*3/uL (ref 150–400)
RBC: 5.01 MIL/uL (ref 3.87–5.11)
RDW: 14.7 % (ref 11.5–15.5)
WBC Count: 7.5 10*3/uL (ref 4.0–10.5)
nRBC: 0 % (ref 0.0–0.2)

## 2021-10-15 LAB — FERRITIN: Ferritin: 47 ng/mL (ref 11–307)

## 2021-10-17 ENCOUNTER — Encounter: Payer: BC Managed Care – PPO | Admitting: Internal Medicine

## 2021-10-19 DIAGNOSIS — M25541 Pain in joints of right hand: Secondary | ICD-10-CM | POA: Diagnosis not present

## 2021-10-19 DIAGNOSIS — M25542 Pain in joints of left hand: Secondary | ICD-10-CM | POA: Diagnosis not present

## 2021-10-23 ENCOUNTER — Inpatient Hospital Stay: Payer: BC Managed Care – PPO

## 2021-11-16 ENCOUNTER — Ambulatory Visit
Admission: RE | Admit: 2021-11-16 | Discharge: 2021-11-16 | Disposition: A | Discharge status: Home / Self Care | Payer: BC Managed Care – PPO | Source: Ambulatory Visit | Attending: Emergency Medicine | Admitting: Emergency Medicine

## 2021-11-16 ENCOUNTER — Inpatient Hospital Stay: Payer: BC Managed Care – PPO | Attending: Hematology

## 2021-11-16 ENCOUNTER — Other Ambulatory Visit: Payer: Self-pay

## 2021-11-16 DIAGNOSIS — F19120 Other psychoactive substance abuse with intoxication, uncomplicated: Secondary | ICD-10-CM | POA: Diagnosis not present

## 2021-11-16 DIAGNOSIS — D500 Iron deficiency anemia secondary to blood loss (chronic): Secondary | ICD-10-CM

## 2021-11-16 DIAGNOSIS — D509 Iron deficiency anemia, unspecified: Secondary | ICD-10-CM | POA: Insufficient documentation

## 2021-11-16 DIAGNOSIS — R911 Solitary pulmonary nodule: Secondary | ICD-10-CM

## 2021-11-16 DIAGNOSIS — F331 Major depressive disorder, recurrent, moderate: Secondary | ICD-10-CM | POA: Diagnosis not present

## 2021-11-16 LAB — IRON AND IRON BINDING CAPACITY (CC-WL,HP ONLY)
Iron: 91 ug/dL (ref 28–170)
Saturation Ratios: 32 % — ABNORMAL HIGH (ref 10.4–31.8)
TIBC: 287 ug/dL (ref 250–450)
UIBC: 196 ug/dL (ref 148–442)

## 2021-11-16 LAB — CBC WITH DIFFERENTIAL (CANCER CENTER ONLY)
Abs Immature Granulocytes: 0.02 10*3/uL (ref 0.00–0.07)
Basophils Absolute: 0.1 10*3/uL (ref 0.0–0.1)
Basophils Relative: 1 %
Eosinophils Absolute: 0.2 10*3/uL (ref 0.0–0.5)
Eosinophils Relative: 2 %
HCT: 45.9 % (ref 36.0–46.0)
Hemoglobin: 15.9 g/dL — ABNORMAL HIGH (ref 12.0–15.0)
Immature Granulocytes: 0 %
Lymphocytes Relative: 25 %
Lymphs Abs: 1.8 10*3/uL (ref 0.7–4.0)
MCH: 32.1 pg (ref 26.0–34.0)
MCHC: 34.6 g/dL (ref 30.0–36.0)
MCV: 92.5 fL (ref 80.0–100.0)
Monocytes Absolute: 0.6 10*3/uL (ref 0.1–1.0)
Monocytes Relative: 8 %
Neutro Abs: 4.7 10*3/uL (ref 1.7–7.7)
Neutrophils Relative %: 64 %
Platelet Count: 237 10*3/uL (ref 150–400)
RBC: 4.96 MIL/uL (ref 3.87–5.11)
RDW: 14.6 % (ref 11.5–15.5)
WBC Count: 7.4 10*3/uL (ref 4.0–10.5)
nRBC: 0 % (ref 0.0–0.2)

## 2021-11-16 LAB — FERRITIN: Ferritin: 51 ng/mL (ref 11–307)

## 2021-11-17 ENCOUNTER — Encounter: Payer: Self-pay | Admitting: Emergency Medicine

## 2021-11-20 DIAGNOSIS — M9905 Segmental and somatic dysfunction of pelvic region: Secondary | ICD-10-CM | POA: Diagnosis not present

## 2021-11-20 DIAGNOSIS — M9906 Segmental and somatic dysfunction of lower extremity: Secondary | ICD-10-CM | POA: Diagnosis not present

## 2021-11-20 DIAGNOSIS — M9903 Segmental and somatic dysfunction of lumbar region: Secondary | ICD-10-CM | POA: Diagnosis not present

## 2021-11-20 DIAGNOSIS — M9904 Segmental and somatic dysfunction of sacral region: Secondary | ICD-10-CM | POA: Diagnosis not present

## 2021-11-21 ENCOUNTER — Inpatient Hospital Stay: Payer: BC Managed Care – PPO

## 2021-11-23 ENCOUNTER — Encounter: Payer: Self-pay | Admitting: Family Medicine

## 2021-11-23 ENCOUNTER — Ambulatory Visit (INDEPENDENT_AMBULATORY_CARE_PROVIDER_SITE_OTHER): Payer: BC Managed Care – PPO | Admitting: Family Medicine

## 2021-11-23 VITALS — BP 110/78 | HR 102 | Temp 97.7°F | Ht 70.0 in | Wt 155.0 lb

## 2021-11-23 DIAGNOSIS — L282 Other prurigo: Secondary | ICD-10-CM

## 2021-11-23 MED ORDER — TRIAMCINOLONE ACETONIDE 0.1 % EX CREA: 1.0000 | TOPICAL_CREAM | Freq: Two times a day (BID) | CUTANEOUS | 0 refills | Status: AC

## 2021-11-23 NOTE — Patient Instructions (Signed)
Use the steroid cream for the next 5-7 days and then stop.   You can take over the counter Zyrtec 2-3 times per day for the next week as well.   Keep the areas as dry as possible.   Cool compresses will help with itching if needed.     Contact Dermatitis Dermatitis is redness, soreness, and swelling (inflammation) of the skin. Contact dermatitis is a reaction to something that touches the skin. There are two types of contact dermatitis: Irritant contact dermatitis. This happens when something bothers (irritates) your skin, like soap. Allergic contact dermatitis. This is caused when you are exposed to something that you are allergic to, such as poison ivy. What are the causes? Common causes of irritant contact dermatitis include: Makeup. Soaps. Detergents. Bleaches. Acids. Metals, such as nickel. Common causes of allergic contact dermatitis include: Plants. Chemicals. Jewelry. Latex. Medicines. Preservatives in products, such as clothing. What increases the risk? Having a job that exposes you to things that bother your skin. Having asthma or eczema. What are the signs or symptoms? Symptoms may happen anywhere the irritant has touched your skin. Symptoms include: Dry or flaky skin. Redness. Cracks. Itching. Pain or a burning feeling. Blisters. Blood or clear fluid draining from skin cracks. With allergic contact dermatitis, swelling may occur. This may happen in places such as the eyelids, mouth, or genitals. How is this treated? This condition is treated by checking for the cause of the reaction and protecting your skin. Treatment may also include: Steroid creams, ointments, or medicines. Antibiotic medicines or other ointments, if you have a skin infection. Lotion or medicines to help with itching. A bandage (dressing). Follow these instructions at home: Skin care Moisturize your skin as needed. Put cool cloths on your skin. Put a baking soda paste on your skin.  Stir water into baking soda until it looks like a paste. Do not scratch your skin. Avoid having things rub up against your skin. Avoid the use of soaps, perfumes, and dyes. Medicines Take or apply over-the-counter and prescription medicines only as told by your doctor. If you were prescribed an antibiotic medicine, take or apply it as told by your doctor. Do not stop using it even if your condition starts to get better. Bathing Take a bath with: Epsom salts. Baking soda. Colloidal oatmeal. Bathe less often. Bathe in warm water. Avoid using hot water. Bandage care If you were given a bandage, change it as told by your doctor. Wash your hands with soap and water before and after you change your bandage. If soap and water are not available, use hand sanitizer. General instructions Avoid the things that caused your reaction. If you do not know what caused it, keep a journal. Write down: What you eat. What skin products you use. What you drink. What you wear in the area that has symptoms. This includes jewelry. Check the affected areas every day for signs of infection. Check for: More redness, swelling, or pain. More fluid or blood. Warmth. Pus or a bad smell. Keep all follow-up visits as told by your doctor. This is important. Contact a doctor if: You do not get better with treatment. Your condition gets worse. You have signs of infection, such as: More swelling. Tenderness. More redness. Soreness. Warmth. You have a fever. You have new symptoms. Get help right away if: You have a very bad headache. You have neck pain. Your neck is stiff. You throw up (vomit). You feel very sleepy. You see red streaks coming  from the area. Your bone or joint near the area hurts after the skin has healed. The area turns darker. You have trouble breathing. Summary Dermatitis is redness, soreness, and swelling of the skin. Symptoms may occur where the irritant has touched you. Treatment  may include medicines and skin care. If you do not know what caused your reaction, keep a journal. Contact a doctor if your condition gets worse or you have signs of infection. This information is not intended to replace advice given to you by your health care provider. Make sure you discuss any questions you have with your health care provider. Document Revised: 02/12/2021 Document Reviewed: 02/12/2021 Elsevier Patient Education  [place].

## 2021-11-23 NOTE — Progress Notes (Signed)
Subjective:     Patient ID: Amanda Murray, female    DOB: 07/01/1975, 46 y.o.   MRN: [id number]  Chief Complaint  Patient presents with   Rash    Rash under arm pits, irritating, itchy burning sensation     HPI Patient is in today for improving pruritic rash in both arm pits. No known trigger. Cannot recall exposure to or using anything new or changes to topical substances.  She has used OTC hydrocortisone with some improvement.  No fever, chills, N/V/D.   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   UTI (lower urinary tract infection)     Past Surgical History:  Procedure Laterality Date   BRONCHIAL BIOPSY  12/25/2020   Procedure: BRONCHIAL BIOPSIES;  Surgeon: Leslye Peer, MD;  Location: [facility];  Service: Pulmonary;;   BRONCHIAL NEEDLE ASPIRATION BIOPSY  12/25/2020   Procedure: BRONCHIAL NEEDLE ASPIRATION BIOPSIES;  Surgeon: Leslye Peer, MD;  Location: [facility];  Service: Pulmonary;;   COLONOSCOPY     UPPER GASTROINTESTINAL ENDOSCOPY     VIDEO BRONCHOSCOPY WITH ENDOBRONCHIAL NAVIGATION N/A 12/25/2020   Procedure: ROBOTIC BRONCHOSCOPY WITH ENDOBRONCHIAL NAVIGATION;  Surgeon: Leslye Peer, MD;  Location: [facility];  Service: Pulmonary;  Laterality: N/A;   VIDEO BRONCHOSCOPY WITH RADIAL ENDOBRONCHIAL ULTRASOUND  12/25/2020   Procedure: RADIAL ENDOBRONCHIAL ULTRASOUND;  Surgeon: Leslye Peer, MD;  Location: [facility];  Service: Pulmonary;;   WRIST SURGERY Right     Family History  Problem Relation Age of Onset   Alcohol abuse Mother    Depression Mother    Drug abuse Mother    Early death Mother    Miscarriages / [place] Mother    Arthritis Father    Drug  abuse Father    Early death Father    Kidney disease Father    Liver disease Father    Arthritis Maternal Grandmother    Hearing loss Maternal Grandmother    Varicose Veins Maternal Grandmother    Diabetes Maternal Grandfather    Heart disease Maternal Grandfather    Diabetes Paternal Grandmother    Heart disease Paternal Grandmother    Diabetes Paternal Grandfather    Heart disease Paternal Grandfather    Alcohol abuse Other    Mental illness Other    Sudden death Other    Hypertension Other    Diabetes Other    Colon cancer Neg Hx    Esophageal cancer Neg Hx    Rectal cancer Neg Hx    Stomach cancer Neg Hx     Social History   Socioeconomic History   Marital status: Single    Spouse name: Not on file   Number of children: 0   Years of education: college   Highest education level: Not on file  Occupational History   Occupation: Hair Stylist  Tobacco Use   Smoking status: Every Day    Packs/day: 1.00    Years: 30.00    Total pack years: 30.00    Types: Cigarettes   Smokeless tobacco: Never   Tobacco comments:    1 pack daily smoked ARJ 02/06/21  Vaping Use   Vaping Use:  Never used  Substance and Sexual Activity   Alcohol use: Not Currently    Comment: Sober Date December 02, 2019 (1 year 1 mo.), 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   Drug use: Not Currently    Frequency: 3.0 times per week    Types: Marijuana    Comment: 12/02/2019   Sexual activity: Not Currently    Birth control/protection: Abstinence, Condom  Other Topics Concern   Not on file  Social History Narrative   Not on file   Social Determinants of Health   Financial Resource Strain: Not on file  Food Insecurity: Not on file  Transportation Needs: Not on file  Physical Activity: Not on file  Stress: Not on file  Social Connections: Not on file  Intimate Partner Violence: Not on file    Outpatient Medications Prior to Visit  Medication Sig Dispense Refill    amphetamine-dextroamphetamine (ADDERALL XR) 30 MG 24 hr capsule Take 30 mg by mouth daily.     ARIPiprazole (ABILIFY) 15 MG tablet      Cholecalciferol (VITAMIN D-3) 25 MCG (1000 UT) CAPS Take 1,000 Units by mouth daily. Soft gel     clonazePAM (KLONOPIN) 0.5 MG tablet Take 0.5 mg by mouth 2 (two) times daily as needed for anxiety.     escitalopram (LEXAPRO) 20 MG tablet      folic acid (FOLVITE) 1 MG tablet Take 1 mg by mouth daily.     traZODone (DESYREL) 50 MG tablet Take 50 mg by mouth at bedtime as needed for sleep.     valACYclovir (VALTREX) 1000 MG tablet TAKE 1 TABLET(1000 MG) BY MOUTH TWICE DAILY (Patient taking differently: 1,000 mg 2 (two) times daily. As needed) 14 tablet 2   benzonatate (TESSALON PERLES) 100 MG capsule Take 1 capsule (100 mg total) by mouth 3 (three) times daily as needed. 20 capsule 0   DULoxetine (CYMBALTA) 60 MG capsule Take 60 mg by mouth daily.     gabapentin (NEURONTIN) 800 MG tablet Take 800 mg by mouth 3 (three) times daily.     lamoTRIgine (LAMICTAL) 150 MG tablet Take 150 mg by mouth daily.     nortriptyline (PAMELOR) 25 MG capsule TAKE 1 CAPSULE(25 MG) BY MOUTH AT BEDTIME 30 capsule 2   oseltamivir (TAMIFLU) 75 MG capsule Take 1 capsule (75 mg total) by mouth 2 (two) times daily. 10 capsule 0   No facility-administered medications prior to visit.    Allergies  Allergen Reactions   Doxycycline Other (See Comments)   Morphine And Related Nausea And Vomiting    ROS     Objective:    Physical Exam Constitutional:      General: She is not in acute distress.    Appearance: She is not ill-appearing.  Lymphadenopathy:     Upper Body:     Right upper body: No axillary adenopathy.     Left upper body: No axillary adenopathy.  Skin:    General: Skin is warm and dry.     Findings: Rash present.     Comments: Bilateral axilla with pruritic rash, red raised bumps. No sign of infection.   Neurological:     General: No focal deficit present.      Mental Status: She is alert and oriented to person, place, and time.  Psychiatric:        Mood and Affect: Mood normal.        Behavior: Behavior normal.     BP 110/78  Pulse (!) 102   Temp 97.7 F (36.5 C) (Oral)   Ht 5\' 10"  (1.778 m)   Wt 155 lb (70.3 kg)   SpO2 97%   BMI 22.24 kg/m  Wt Readings from Last 3 Encounters:  11/23/21 155 lb (70.3 kg)  06/22/21 160 lb 8 oz (72.8 kg)  06/18/21 166 lb (75.3 kg)       Assessment & Plan:   Problem List Items Addressed This Visit   None Visit Diagnoses     Pruritic rash    -  Primary   Relevant Medications   triamcinolone cream (KENALOG) 0.1 %       I am having Maguire Tremblay start on triamcinolone cream. I am also having her maintain her gabapentin, traZODone, folic acid, Vitamin D-3, clonazePAM, valACYclovir, DULoxetine, lamoTRIgine, amphetamine-dextroamphetamine, oseltamivir, benzonatate, nortriptyline, ARIPiprazole, and escitalopram.  Meds ordered this encounter  Medications   triamcinolone cream (KENALOG) 0.1 %    Sig: Apply 1 Application topically 2 (two) times daily.    Dispense:  30 g    Refill:  0    Order Specific Question:   Supervising Provider    Answer:   Hillard Danker A [[id number]]

## 2021-12-12 ENCOUNTER — Encounter: Payer: Self-pay | Admitting: Emergency Medicine

## 2021-12-12 ENCOUNTER — Ambulatory Visit (INDEPENDENT_AMBULATORY_CARE_PROVIDER_SITE_OTHER): Payer: BC Managed Care – PPO | Admitting: Emergency Medicine

## 2021-12-12 VITALS — BP 102/72 | HR 88 | Temp 98.0°F | Ht 70.0 in | Wt 154.0 lb

## 2021-12-12 DIAGNOSIS — J22 Unspecified acute lower respiratory infection: Secondary | ICD-10-CM | POA: Diagnosis not present

## 2021-12-12 DIAGNOSIS — R911 Solitary pulmonary nodule: Secondary | ICD-10-CM | POA: Diagnosis not present

## 2021-12-12 DIAGNOSIS — J410 Simple chronic bronchitis: Secondary | ICD-10-CM | POA: Diagnosis not present

## 2021-12-12 MED ORDER — AZITHROMYCIN 250 MG PO TABS: ORAL_TABLET | ORAL | 0 refills | Status: AC

## 2021-12-12 NOTE — Progress Notes (Signed)
Amanda Murray 46 y.o.   Chief Complaint  Patient presents with   Cough   Results    Wants to go over her Ct scan of her chest     HISTORY OF PRESENT ILLNESS: Acute problem visit today.  Patient of Dr. Cathlean Cower. This is a 46 y.o. female complaining of productive cough for the past several days. Chronic smoker. Recent CT scan of chest reviewed with patient.  Shows pulmonary nodules. Patient follows up with pulmonary. No other complaints or medical concerns today.  HPI   Prior to Admission medications   Medication Sig Start Date End Date Taking? Authorizing Provider  amphetamine-dextroamphetamine (ADDERALL XR) 30 MG 24 hr capsule Take 30 mg by mouth daily. 05/29/21  Yes [provider]  ARIPiprazole (ABILIFY) 15 MG tablet  08/11/21  Yes [provider]  benzonatate (TESSALON PERLES) 100 MG capsule Take 1 capsule (100 mg total) by mouth 3 (three) times daily as needed. 07/04/21  Yes Hawks, Christy A, FNP  Cholecalciferol (VITAMIN D-3) 25 MCG (1000 UT) CAPS Take 1,000 Units by mouth daily. Soft gel   Yes [provider]  clonazePAM (KLONOPIN) 0.5 MG tablet Take 0.5 mg by mouth 2 (two) times daily as needed for anxiety.   Yes [provider]  DULoxetine (CYMBALTA) 60 MG capsule Take 60 mg by mouth daily.   Yes [provider]  escitalopram (LEXAPRO) 20 MG tablet  08/11/21  Yes [provider]  folic acid (FOLVITE) 1 MG tablet Take 1 mg by mouth daily.   Yes [provider]  gabapentin (NEURONTIN) 800 MG tablet Take 800 mg by mouth 3 (three) times daily.   Yes [provider]  lamoTRIgine (LAMICTAL) 150 MG tablet Take 150 mg by mouth daily. 05/25/21  Yes [provider]  nortriptyline (PAMELOR) 25 MG capsule TAKE 1 CAPSULE(25 MG) BY MOUTH AT BEDTIME 08/06/21  Yes Gregor Hams, MD  oseltamivir (TAMIFLU) 75 MG capsule Take 1 capsule (75 mg total) by mouth 2 (two) times daily. 07/04/21  Yes Hawks, Christy A, FNP   traZODone (DESYREL) 50 MG tablet Take 50 mg by mouth at bedtime as needed for sleep.   Yes [provider]  triamcinolone cream (KENALOG) 0.1 % Apply 1 Application topically 2 (two) times daily. 11/23/21  Yes Henson, Vickie L, NP-C  valACYclovir (VALTREX) 1000 MG tablet TAKE 1 TABLET(1000 MG) BY MOUTH TWICE DAILY Patient taking differently: 1,000 mg 2 (two) times daily. 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   Sinus pain 11/22/2020   Anal pain 11/22/2020   Hematochezia 11/22/2020   Generalized abdominal pain 11/22/2020   Bilateral hand pain 11/22/2020   Sore in nose 11/22/2020   Fatigue 11/22/2020   Hip pain 08/06/2020   Lymphadenitis 04/16/2020   Odynophagia 04/16/2020   Iron deficiency 03/09/2020   Chronic pain after traumatic injury 09/11/2019   Allergic rhinitis 02/09/2019   Hyperglycemia 02/09/2019   HLD (hyperlipidemia) 02/09/2019   Anemia 02/09/2019   Alcohol use disorder, mild, 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/27/2017   Major depressive disorder, recurrent severe without psychotic features ([place])  06/01/2017   Severe recurrent major depression without psychotic features ([place]) 06/01/2017   Panic attacks 12/18/2016   Left anterior cruciate ligament tear 06/24/2016   Hemarthrosis of left knee 06/17/2016   Possible exposure to STD 10/10/2015   Smoker 10/10/2015   Skin lesion of back 10/10/2015   Preventative health care 29/93/7169   Helicobacter pylori ab+ 09/30/2013   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   Anxiety and depression    Migraines     Past Medical History:  Diagnosis Date   Anemia    Anxiety     Depression    GERD (gastroesophageal reflux disease)    Helicobacter pylori ab+ 09/30/2013   Treated approx 2008   Migraines    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   UTI (lower urinary tract infection)     Past Surgical History:  Procedure Laterality Date   BRONCHIAL BIOPSY  12/25/2020   Procedure: BRONCHIAL BIOPSIES;  Surgeon: Collene Gobble, MD;  Location: [facility];  Service: Pulmonary;;   BRONCHIAL NEEDLE ASPIRATION BIOPSY  12/25/2020   Procedure: BRONCHIAL NEEDLE ASPIRATION BIOPSIES;  Surgeon: Collene Gobble, MD;  Location: [facility];  Service: Pulmonary;;   COLONOSCOPY     UPPER GASTROINTESTINAL ENDOSCOPY     VIDEO BRONCHOSCOPY WITH ENDOBRONCHIAL NAVIGATION N/A 12/25/2020   Procedure: ROBOTIC BRONCHOSCOPY WITH ENDOBRONCHIAL NAVIGATION;  Surgeon: Collene Gobble, MD;  Location: [facility];  Service: Pulmonary;  Laterality: N/A;   VIDEO BRONCHOSCOPY WITH RADIAL ENDOBRONCHIAL ULTRASOUND  12/25/2020   Procedure: RADIAL ENDOBRONCHIAL ULTRASOUND;  Surgeon: Collene Gobble, MD;  Location: [facility];  Service: Pulmonary;;   WRIST SURGERY Right     Social History   Socioeconomic History   Marital status: Single    Spouse name: Not on file   Number of children: 0   Years of education: college   Highest education level: Not on file  Occupational History   Occupation: Hair Stylist  Tobacco Use   Smoking status: Every Day    Packs/day: 1.00    Years: 30.00    Total pack years: 30.00    Types: Cigarettes   Smokeless tobacco: Never   Tobacco comments:    1 pack daily smoked ARJ 02/06/21  Vaping Use   Vaping Use: Never used  Substance and Sexual Activity   Alcohol use: Not Currently    Comment: Sober Date December 02, 2019 (1 year 1 mo.), 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   Drug use: Not Currently    Frequency: 3.0 times per week    Types: Marijuana    Comment: 12/02/2019   Sexual activity: Not Currently    Birth control/protection: Abstinence, Condom   Other Topics Concern   Not on file  Social History Narrative   Not on file   Social Determinants of Health   Financial Resource Strain: Not on file  Food Insecurity: Not on file  Transportation Needs: Not on file  Physical Activity: Not on file  Stress: Not on file  Social Connections: Not on file  Intimate Partner Violence: Not on file    Family History  Problem Relation Age of Onset   Alcohol abuse Mother    Depression Mother    Drug abuse Mother    Early death Mother    Miscarriages / [place] Mother    Arthritis Father    Drug abuse Father    Early death Father    Kidney disease Father    Liver disease Father    Arthritis Maternal Grandmother  Hearing loss Maternal Grandmother    Varicose Veins Maternal Grandmother    Diabetes Maternal Grandfather    Heart disease Maternal Grandfather    Diabetes Paternal Grandmother    Heart disease Paternal Grandmother    Diabetes Paternal Grandfather    Heart disease Paternal Grandfather    Alcohol abuse Other    Mental illness Other    Sudden death Other    Hypertension Other    Diabetes Other    Colon cancer Neg Hx    Esophageal cancer Neg Hx    Rectal cancer Neg Hx    Stomach cancer Neg Hx      Review of Systems  Constitutional: Negative.  Negative for chills and fever.  HENT:  Positive for congestion.   Respiratory:  Positive for cough, sputum production and shortness of breath. Negative for hemoptysis and wheezing.   Cardiovascular:  Negative for chest pain and palpitations.  Gastrointestinal:  Negative for abdominal pain, nausea and vomiting.  Skin: Negative.  Negative for rash.  Neurological:  Negative for dizziness and headaches.  All other systems reviewed and are negative.  Today's Vitals   12/12/21 1616  BP: 102/72  Pulse: 88  Temp: 98 F (36.7 C)  TempSrc: Oral  SpO2: 98%  Weight: 154 lb (69.9 kg)  Height: '5\' 10"'$  (1.778 m)   Body mass index is 22.1 kg/m.   Physical Exam Vitals  reviewed.  Constitutional:      Appearance: Normal appearance.  HENT:     Head: Normocephalic.  Eyes:     Extraocular Movements: Extraocular movements intact.     Pupils: Pupils are equal, round, and reactive to light.  Cardiovascular:     Rate and Rhythm: Normal rate and regular rhythm.     Pulses: Normal pulses.     Heart sounds: Normal heart sounds.  Pulmonary:     Effort: Pulmonary effort is normal.     Breath sounds: Normal breath sounds.  Musculoskeletal:     Cervical back: No tenderness.  Lymphadenopathy:     Cervical: No cervical adenopathy.  Skin:    General: Skin is warm and dry.  Neurological:     General: No focal deficit present.     Mental Status: She is alert and oriented to person, place, and time.  Psychiatric:        Mood and Affect: Mood normal.        Behavior: Behavior normal.      ASSESSMENT & PLAN: Problem List Items Addressed This Visit       Respiratory   Lower respiratory infection - Primary    In the setting of COPD and chronic smoking, may benefit from antibiotic Will start Zithromax daily for 5 days.      Relevant Medications   azithromycin (ZITHROMAX) 250 MG tablet   Smokers' cough [facility])    Cardiovascular and cancer risks associated with smoking discussed.  Smoking cessation advice given.  Patient lacks motivation.      Pulmonary nodule    Most recent CT chest report reviewed with patient. Needs to follow-up with pulmonary doctors as scheduled.      Patient Instructions  Acute Bronchitis, Adult  Acute bronchitis is when air tubes in the lungs (bronchi) suddenly get swollen. The condition can make it hard for you to breathe. In adults, acute bronchitis usually goes away within 2 weeks. A cough caused by bronchitis may last up to 3 weeks. Smoking, allergies, and asthma can make the condition worse. What are the causes?  Germs that cause cold and flu (viruses). The most common cause of this condition is the virus that causes the  common cold. Bacteria. Substances that bother (irritate) the lungs, including: Smoke from cigarettes and other types of tobacco. Dust and pollen. Fumes from chemicals, gases, or burned fuel. Indoor or outdoor air pollution. What increases the risk? A weak body's defense system. This is also called the immune system. Any condition that affects your lungs and breathing, such as asthma. What are the signs or symptoms? A cough. Coughing up clear, yellow, or green mucus. Making high-pitched whistling sounds when you breathe, most often when you breathe out (wheezing). Runny or stuffy nose. Having too much mucus in your lungs (chest congestion). Shortness of breath. Body aches. A sore throat. How is this treated? Acute bronchitis may go away over time without treatment. Your doctor may tell you to: Drink more fluids. This will help thin your mucus so it is easier to cough up. Use a device that gets medicine into your lungs (inhaler). Use a vaporizer or a humidifier. These are machines that add water to the air. This helps with coughing and poor breathing. Take a medicine that thins mucus and helps clear it from your lungs. Take a medicine that prevents or stops coughing. It is not common to take an antibiotic medicine for this condition. Follow these instructions at home:  Take over-the-counter and prescription medicines only as told by your doctor. Use an inhaler, vaporizer, or humidifier as told by your doctor. Take two teaspoons (10 mL) of honey at bedtime. This helps lessen your coughing at night. Drink enough fluid to keep your pee (urine) pale yellow. Do not smoke or use any products that contain nicotine or tobacco. If you need help quitting, ask your doctor. Get a lot of rest. Return to your normal activities when your doctor says that it is safe. Keep all follow-up visits. How is this prevented?  Wash your hands often with soap and water for at least 20 seconds. If you  cannot use soap and water, use hand sanitizer. Avoid contact with people who have cold symptoms. Try not to touch your mouth, nose, or eyes with your hands. Avoid breathing in smoke or chemical fumes. Make sure to get the flu shot every year. Contact a doctor if: Your symptoms do not get better in 2 weeks. You have trouble coughing up the mucus. Your cough keeps you awake at night. You have a fever. Get help right away if: You cough up blood. You have chest pain. You have very bad shortness of breath. You faint or keep feeling like you are going to faint. You have a very bad headache. Your fever or chills get worse. These symptoms may be an emergency. Get help right away. Call your local emergency services (911 in the [place] Do not wait to see if the symptoms will go away. Do not drive yourself to the hospital. Summary Acute bronchitis is when air tubes in the lungs (bronchi) suddenly get swollen. In adults, acute bronchitis usually goes away within 2 weeks. Drink more fluids. This will help thin your mucus so it is easier to cough up. Take over-the-counter and prescription medicines only as told by your doctor. Contact a doctor if your symptoms do not improve after 2 weeks of treatment. This information is not intended to replace advice given to you by your health care provider. Make sure you discuss any questions you have with your health care provider. Document Revised: 08/30/2020  Document Reviewed: 08/30/2020 Elsevier Patient Education  [place], MD [facility]

## 2021-12-12 NOTE — Patient Instructions (Signed)
  Acute Bronchitis, Adult  Acute bronchitis is when air tubes in the lungs (bronchi) suddenly get swollen. The condition can make it hard for you to breathe. In adults, acute bronchitis usually goes away within 2 weeks. A cough caused by bronchitis may last up to 3 weeks. Smoking, allergies, and asthma can make the condition worse. What are the causes? Germs that cause cold and flu (viruses). The most common cause of this condition is the virus that causes the common cold. Bacteria. Substances that bother (irritate) the lungs, including: Smoke from cigarettes and other types of tobacco. Dust and pollen. Fumes from chemicals, gases, or burned fuel. Indoor or outdoor air pollution. What increases the risk? A weak body's defense system. This is also called the immune system. Any condition that affects your lungs and breathing, such as asthma. What are the signs or symptoms? A cough. Coughing up clear, yellow, or green mucus. Making high-pitched whistling sounds when you breathe, most often when you breathe out (wheezing). Runny or stuffy nose. Having too much mucus in your lungs (chest congestion). Shortness of breath. Body aches. A sore throat. How is this treated? Acute bronchitis may go away over time without treatment. Your doctor may tell you to: Drink more fluids. This will help thin your mucus so it is easier to cough up. Use a device that gets medicine into your lungs (inhaler). Use a vaporizer or a humidifier. These are machines that add water to the air. This helps with coughing and poor breathing. Take a medicine that thins mucus and helps clear it from your lungs. Take a medicine that prevents or stops coughing. It is not common to take an antibiotic medicine for this condition. Follow these instructions at home:  Take over-the-counter and prescription medicines only as told by your doctor. Use an inhaler, vaporizer, or humidifier as told by your doctor. Take two  teaspoons (10 mL) of honey at bedtime. This helps lessen your coughing at night. Drink enough fluid to keep your pee (urine) pale yellow. Do not smoke or use any products that contain nicotine or tobacco. If you need help quitting, ask your doctor. Get a lot of rest. Return to your normal activities when your doctor says that it is safe. Keep all follow-up visits. How is this prevented?  Wash your hands often with soap and water for at least 20 seconds. If you cannot use soap and water, use hand sanitizer. Avoid contact with people who have cold symptoms. Try not to touch your mouth, nose, or eyes with your hands. Avoid breathing in smoke or chemical fumes. Make sure to get the flu shot every year. Contact a doctor if: Your symptoms do not get better in 2 weeks. You have trouble coughing up the mucus. Your cough keeps you awake at night. You have a fever. Get help right away if: You cough up blood. You have chest pain. You have very bad shortness of breath. You faint or keep feeling like you are going to faint. You have a very bad headache. Your fever or chills get worse. These symptoms may be an emergency. Get help right away. Call your local emergency services (911 in the [place] Do not wait to see if the symptoms will go away. Do not drive yourself to the hospital. Summary Acute bronchitis is when air tubes in the lungs (bronchi) suddenly get swollen. In adults, acute bronchitis usually goes away within 2 weeks. Drink more fluids. This will help thin your mucus so it   is easier to cough up. Take over-the-counter and prescription medicines only as told by your doctor. Contact a doctor if your symptoms do not improve after 2 weeks of treatment. This information is not intended to replace advice given to you by your health care provider. Make sure you discuss any questions you have with your health care provider. Document Revised: 08/30/2020 Document Reviewed: 08/30/2020 Elsevier  Patient Education  2023 [employer]  

## 2021-12-12 NOTE — Assessment & Plan Note (Signed)
Cardiovascular and cancer risks associated with smoking discussed.  Smoking cessation advice given.  Patient lacks motivation.

## 2021-12-12 NOTE — Assessment & Plan Note (Signed)
In the setting of COPD and chronic smoking, may benefit from antibiotic Will start Zithromax daily for 5 days.

## 2021-12-12 NOTE — Assessment & Plan Note (Signed)
Most recent CT chest report reviewed with patient. Needs to follow-up with pulmonary doctors as scheduled.

## 2021-12-13 ENCOUNTER — Telehealth: Payer: Self-pay | Admitting: Internal Medicine

## 2021-12-13 NOTE — Telephone Encounter (Signed)
Notified Rob w/ MD response.Marland KitchenJohny Murray

## 2021-12-13 NOTE — Telephone Encounter (Signed)
Z-Pak to be taken as indicated.  Once a day for 5 days.

## 2021-12-13 NOTE — Telephone Encounter (Signed)
Rob from [facility] on [address] is requesting a callback. He said they received an escript for azithromycin (ZITHROMAX) 250 MG tablet with no instructions on how to take. Rob said he is unsure if it is a z-pak or not. Requesting a call for clarification.   Please advise  CB: Rob [phone]

## 2021-12-18 DIAGNOSIS — M5451 Vertebrogenic low back pain: Secondary | ICD-10-CM | POA: Diagnosis not present

## 2021-12-18 DIAGNOSIS — M9902 Segmental and somatic dysfunction of thoracic region: Secondary | ICD-10-CM | POA: Diagnosis not present

## 2021-12-18 DIAGNOSIS — M9906 Segmental and somatic dysfunction of lower extremity: Secondary | ICD-10-CM | POA: Diagnosis not present

## 2021-12-18 DIAGNOSIS — M25552 Pain in left hip: Secondary | ICD-10-CM | POA: Diagnosis not present

## 2021-12-24 ENCOUNTER — Inpatient Hospital Stay: Payer: BC Managed Care – PPO | Attending: Hematology

## 2021-12-28 DIAGNOSIS — F331 Major depressive disorder, recurrent, moderate: Secondary | ICD-10-CM | POA: Diagnosis not present

## 2021-12-28 DIAGNOSIS — F19120 Other psychoactive substance abuse with intoxication, uncomplicated: Secondary | ICD-10-CM | POA: Diagnosis not present

## 2022-01-21 ENCOUNTER — Inpatient Hospital Stay: Payer: BC Managed Care – PPO | Attending: Hematology

## 2022-01-21 ENCOUNTER — Other Ambulatory Visit: Payer: Self-pay

## 2022-01-21 DIAGNOSIS — D509 Iron deficiency anemia, unspecified: Secondary | ICD-10-CM | POA: Diagnosis not present

## 2022-01-21 DIAGNOSIS — N920 Excessive and frequent menstruation with regular cycle: Secondary | ICD-10-CM | POA: Insufficient documentation

## 2022-01-21 DIAGNOSIS — D500 Iron deficiency anemia secondary to blood loss (chronic): Secondary | ICD-10-CM

## 2022-01-21 LAB — CBC WITH DIFFERENTIAL (CANCER CENTER ONLY)
Abs Immature Granulocytes: 0.01 10*3/uL (ref 0.00–0.07)
Basophils Absolute: 0.1 10*3/uL (ref 0.0–0.1)
Basophils Relative: 1 %
Eosinophils Absolute: 0.1 10*3/uL (ref 0.0–0.5)
Eosinophils Relative: 1 %
HCT: 46.8 % — ABNORMAL HIGH (ref 36.0–46.0)
Hemoglobin: 16.1 g/dL — ABNORMAL HIGH (ref 12.0–15.0)
Immature Granulocytes: 0 %
Lymphocytes Relative: 20 %
Lymphs Abs: 1.3 10*3/uL (ref 0.7–4.0)
MCH: 31.9 pg (ref 26.0–34.0)
MCHC: 34.4 g/dL (ref 30.0–36.0)
MCV: 92.9 fL (ref 80.0–100.0)
Monocytes Absolute: 0.6 10*3/uL (ref 0.1–1.0)
Monocytes Relative: 9 %
Neutro Abs: 4.5 10*3/uL (ref 1.7–7.7)
Neutrophils Relative %: 69 %
Platelet Count: 255 10*3/uL (ref 150–400)
RBC: 5.04 MIL/uL (ref 3.87–5.11)
RDW: 13 % (ref 11.5–15.5)
WBC Count: 6.5 10*3/uL (ref 4.0–10.5)
nRBC: 0 % (ref 0.0–0.2)

## 2022-01-21 LAB — FERRITIN: Ferritin: 46 ng/mL (ref 11–307)

## 2022-01-21 LAB — IRON AND IRON BINDING CAPACITY (CC-WL,HP ONLY)
Iron: 104 ug/dL (ref 28–170)
Saturation Ratios: 36 % — ABNORMAL HIGH (ref 10.4–31.8)
TIBC: 287 ug/dL (ref 250–450)
UIBC: 183 ug/dL (ref 148–442)

## 2022-01-22 ENCOUNTER — Encounter: Payer: Self-pay | Admitting: Hematology

## 2022-01-23 ENCOUNTER — Inpatient Hospital Stay: Payer: BC Managed Care – PPO

## 2022-01-29 ENCOUNTER — Telehealth: Payer: Self-pay

## 2022-01-29 NOTE — Telephone Encounter (Signed)
This nurse received a message from this patient stating that she had a call about being scheduled for IV infusion.  Patient is requesting to receive a call from the MD due to in the last visit she was informed that she would be at risk for blood clots if she received another iron infusion.  Patient would like to understand why the infusion is being ordered.  This nurse forwarded the request to MD.

## 2022-01-30 ENCOUNTER — Other Ambulatory Visit: Payer: Self-pay

## 2022-01-30 DIAGNOSIS — D500 Iron deficiency anemia secondary to blood loss (chronic): Secondary | ICD-10-CM

## 2022-01-30 DIAGNOSIS — D508 Other iron deficiency anemias: Secondary | ICD-10-CM

## 2022-01-30 DIAGNOSIS — D509 Iron deficiency anemia, unspecified: Secondary | ICD-10-CM

## 2022-01-30 NOTE — Progress Notes (Signed)
Referral order sent to [facility] in [place], [place] [phone]  [phone]).  Order, pt demographics, Dr. Ernestina Penna last office note, and labs sent with referral.  Epic faxed and traditionally faxed information to the New patient referral team at [place].

## 2022-02-05 ENCOUNTER — Inpatient Hospital Stay: Payer: BC Managed Care – PPO

## 2022-02-12 ENCOUNTER — Encounter: Payer: Self-pay | Admitting: Hematology

## 2022-02-22 ENCOUNTER — Inpatient Hospital Stay: Payer: BC Managed Care – PPO | Attending: Hematology

## 2022-02-28 DIAGNOSIS — F19120 Other psychoactive substance abuse with intoxication, uncomplicated: Secondary | ICD-10-CM | POA: Diagnosis not present

## 2022-02-28 DIAGNOSIS — F331 Major depressive disorder, recurrent, moderate: Secondary | ICD-10-CM | POA: Diagnosis not present

## 2022-03-06 IMAGING — DX DG HIP (WITH OR WITHOUT PELVIS) 2-3V*L*
3 series · 3 of 3 positions shown · non-contrast
Comparison: None

CLINICAL DATA: Chronic LEFT hip pain, no known injury

EXAM:
DG HIP (WITH OR WITHOUT PELVIS) 2-3V LEFT

[hip ap]
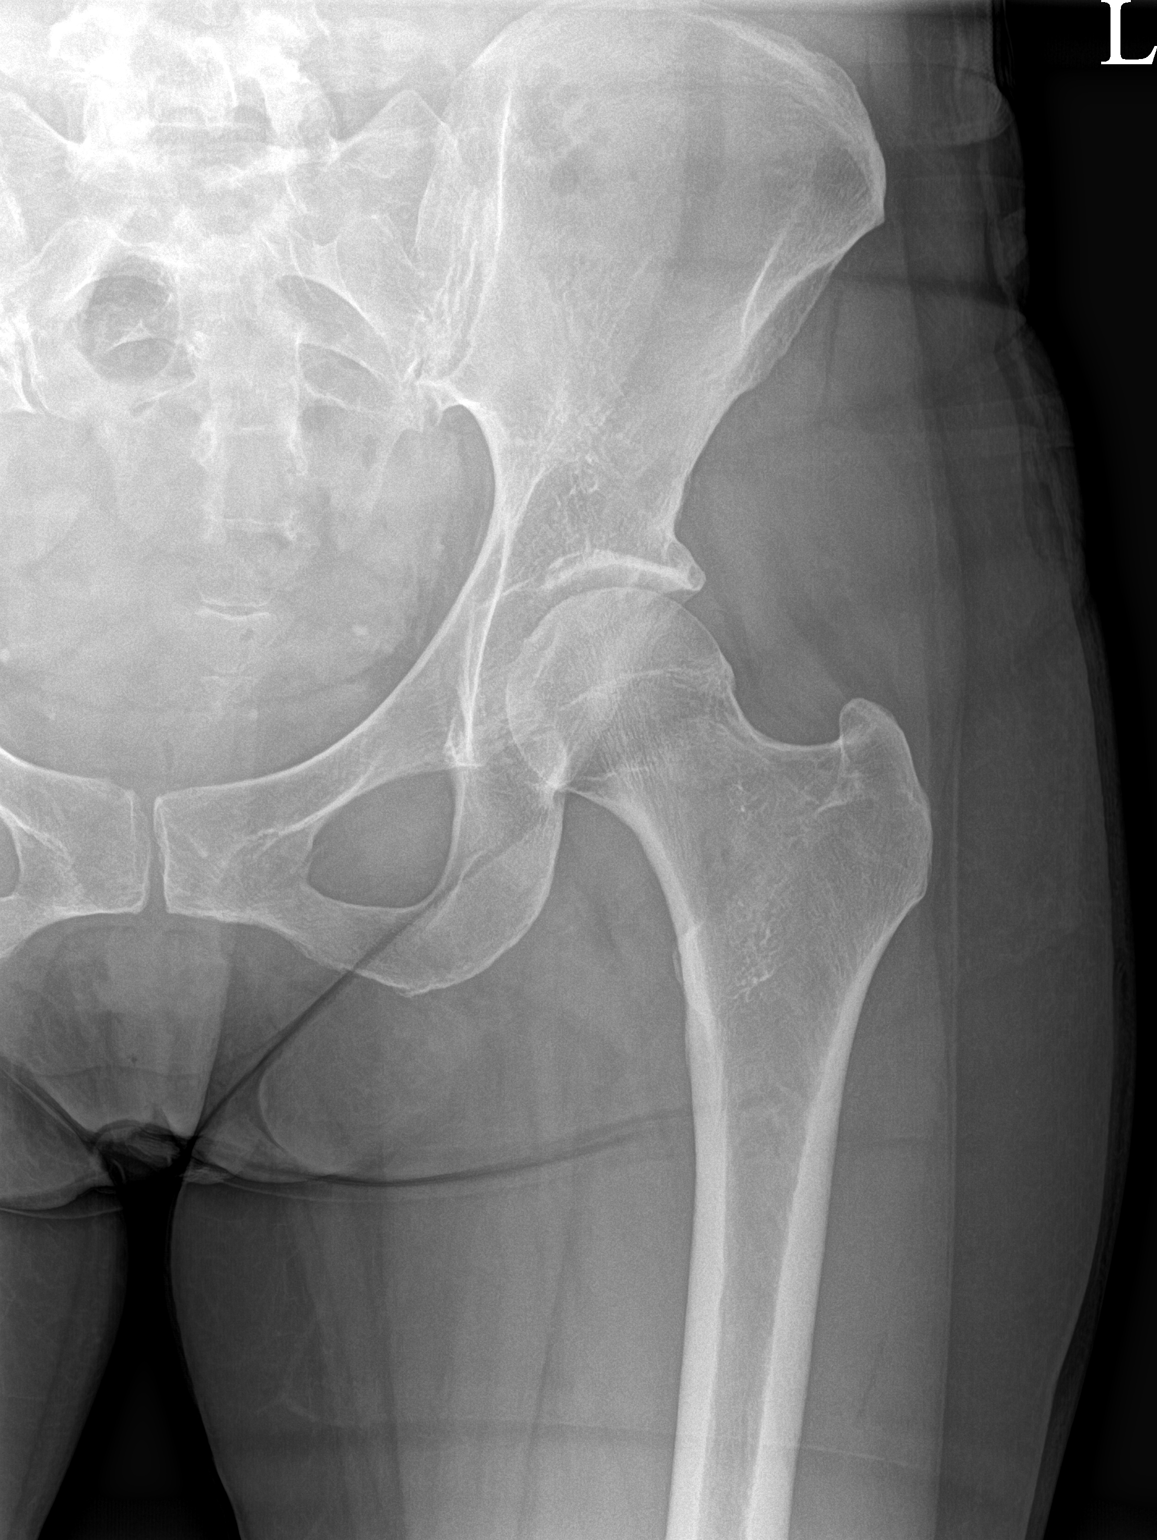

[hip frog leg]
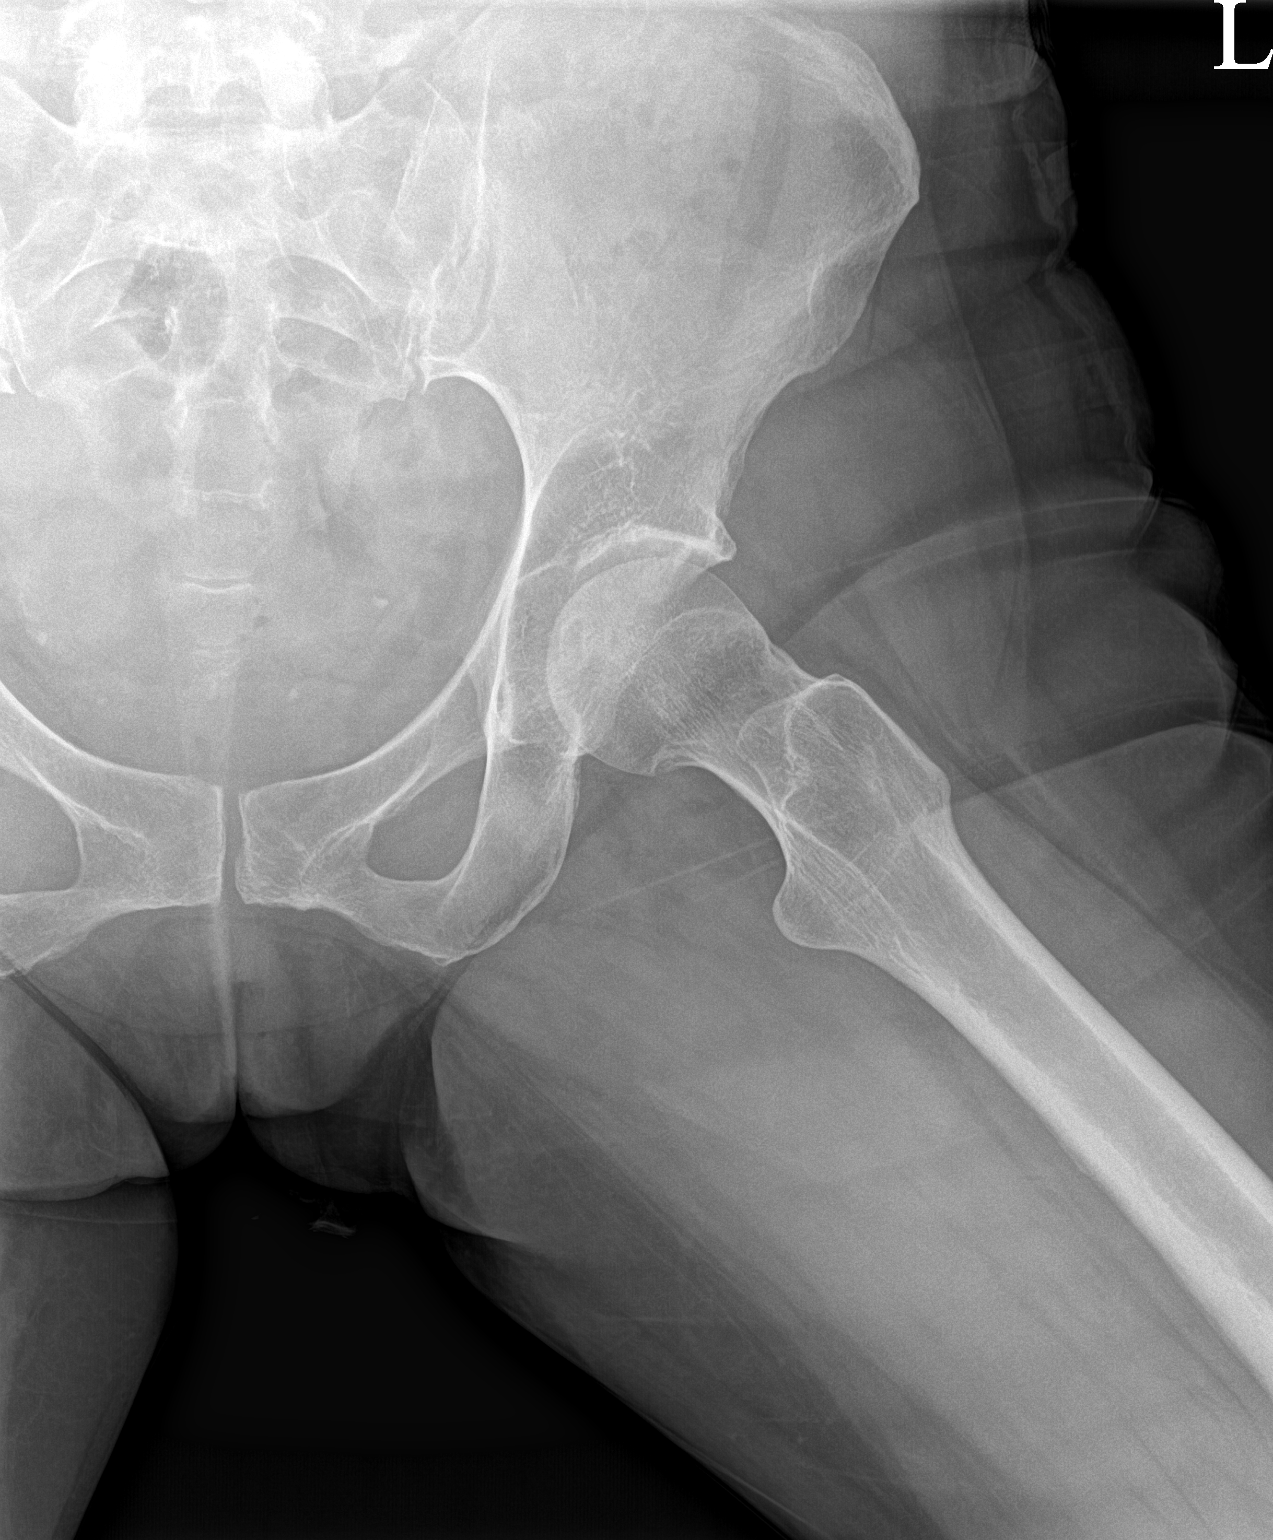

[pelvis ap]
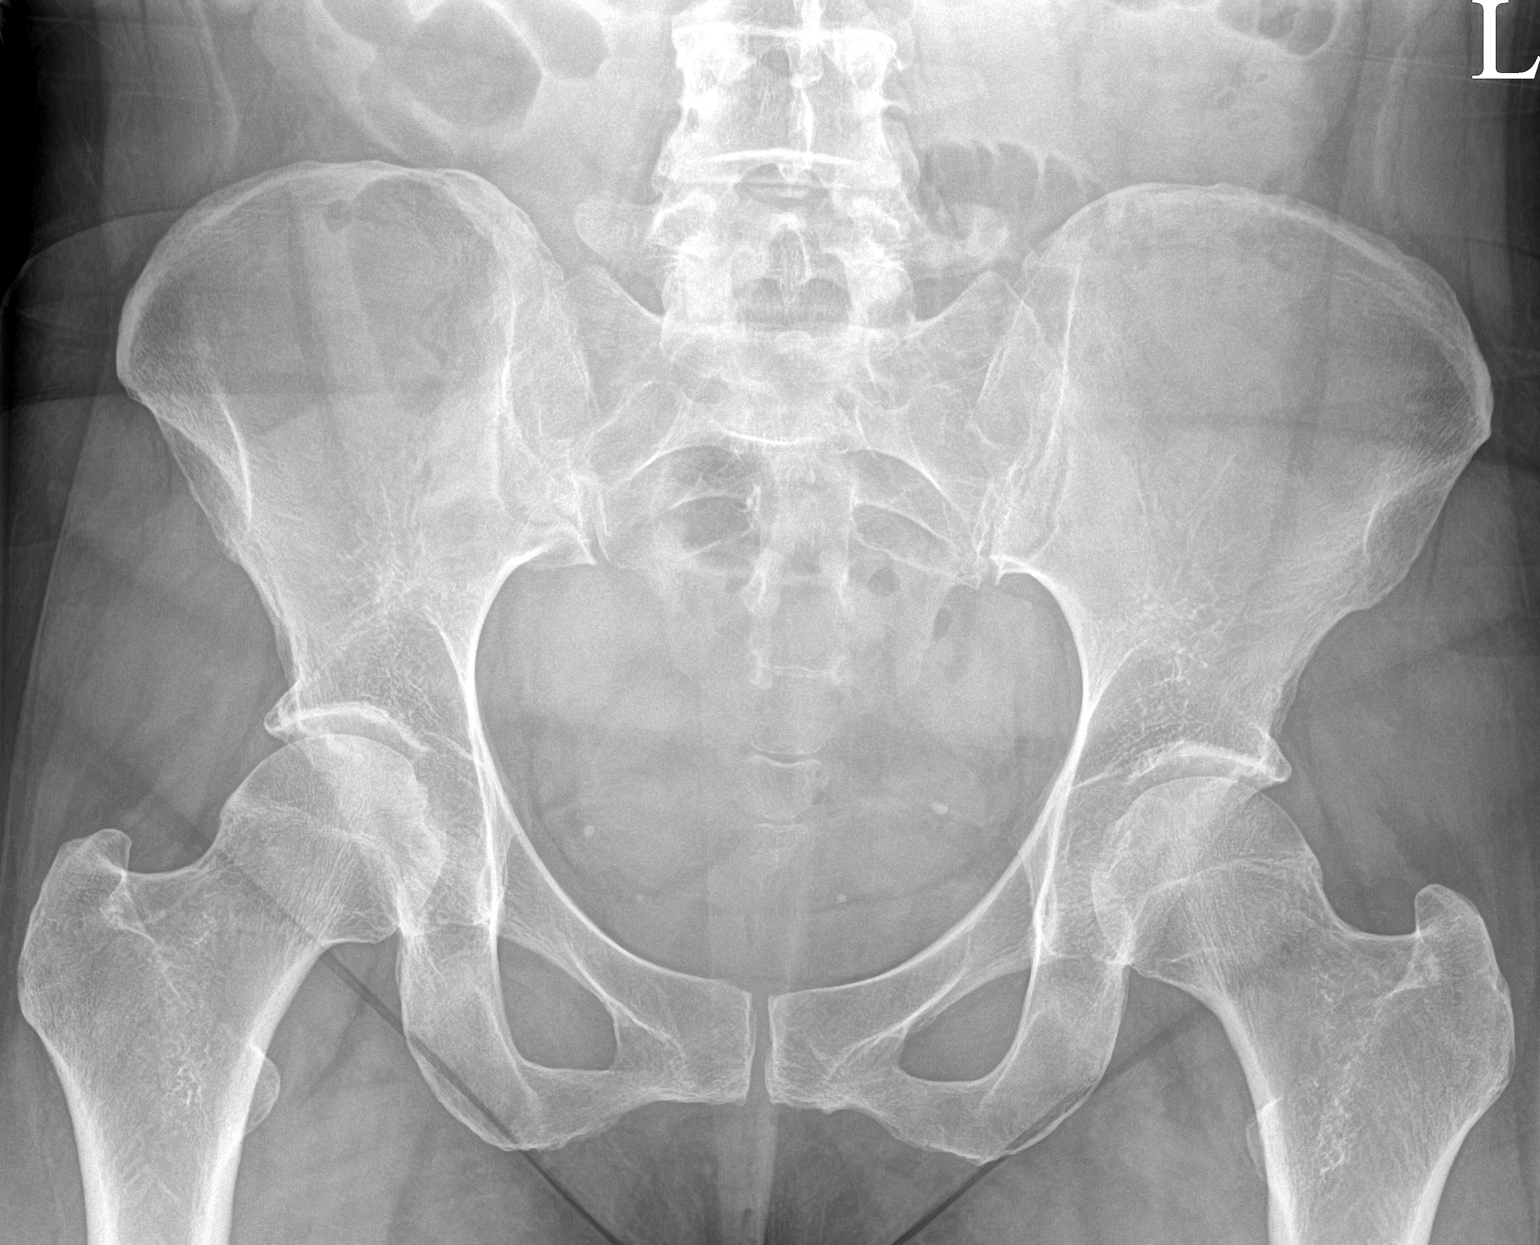

[3 of 3 positions shown; findings below may reference images not displayed]

FINDINGS: Osseous mineralization normal for technique.

Hip and SI joint spaces symmetric and preserved.

No acute fracture, dislocation, or bone destruction.

Few pelvic phleboliths.
IMPRESSION: Normal exam

## 2022-03-27 ENCOUNTER — Inpatient Hospital Stay: Payer: BC Managed Care – PPO | Attending: Hematology

## 2022-04-08 DIAGNOSIS — C7A090 Malignant carcinoid tumor of the bronchus and lung: Secondary | ICD-10-CM | POA: Diagnosis not present

## 2022-04-08 DIAGNOSIS — D751 Secondary polycythemia: Secondary | ICD-10-CM | POA: Diagnosis not present

## 2022-04-08 DIAGNOSIS — E611 Iron deficiency: Secondary | ICD-10-CM | POA: Diagnosis not present

## 2022-04-24 ENCOUNTER — Other Ambulatory Visit: Payer: BC Managed Care – PPO

## 2022-06-17 ENCOUNTER — Encounter: Payer: Self-pay | Admitting: Hematology

## 2022-06-23 NOTE — Progress Notes (Deleted)
Patient Care Team: Biagio Borg, MD as PCP - General (Internal Medicine) Gregor Hams, MD as Consulting Physician (Family Medicine) Truitt Merle, MD as Consulting Physician (Hematology) Aloha Gell, MD as Consulting Physician (Obstetrics and Gynecology)   CHIEF COMPLAINT:   Oncology History   No history exists.     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   UTI (lower urinary tract infection)      Past Surgical History:  Procedure Laterality Date   BRONCHIAL BIOPSY  12/25/2020   Procedure: BRONCHIAL BIOPSIES;  Surgeon: Collene Gobble, MD;  Location: [facility];  Service: Pulmonary;;   BRONCHIAL NEEDLE ASPIRATION BIOPSY  12/25/2020   Procedure: BRONCHIAL NEEDLE ASPIRATION BIOPSIES;  Surgeon: Collene Gobble, MD;  Location: [facility];  Service: Pulmonary;;   COLONOSCOPY     UPPER GASTROINTESTINAL ENDOSCOPY     VIDEO BRONCHOSCOPY WITH ENDOBRONCHIAL NAVIGATION N/A 12/25/2020   Procedure: ROBOTIC BRONCHOSCOPY WITH ENDOBRONCHIAL NAVIGATION;  Surgeon: Collene Gobble, MD;  Location: [facility];  Service: Pulmonary;  Laterality: N/A;   VIDEO BRONCHOSCOPY WITH RADIAL ENDOBRONCHIAL ULTRASOUND  12/25/2020   Procedure: RADIAL ENDOBRONCHIAL ULTRASOUND;  Surgeon: Collene Gobble, MD;  Location: [facility];  Service: Pulmonary;;   WRIST SURGERY Right      Outpatient Encounter Medications as of 06/24/2022  Medication Sig   amphetamine-dextroamphetamine (ADDERALL XR) 30 MG 24 hr capsule Take 30 mg by mouth daily.   ARIPiprazole (ABILIFY) 15 MG tablet    azithromycin (ZITHROMAX) 250 MG tablet Sig as indicated   benzonatate (TESSALON PERLES) 100 MG capsule Take 1 capsule (100 mg total) by mouth 3 (three) times daily as needed.   Cholecalciferol  (VITAMIN D-3) 25 MCG (1000 UT) CAPS Take 1,000 Units by mouth daily. Soft gel   clonazePAM (KLONOPIN) 0.5 MG tablet Take 0.5 mg by mouth 2 (two) times daily as needed for anxiety.   DULoxetine (CYMBALTA) 60 MG capsule Take 60 mg by mouth daily.   escitalopram (LEXAPRO) 20 MG tablet    folic acid (FOLVITE) 1 MG tablet Take 1 mg by mouth daily.   gabapentin (NEURONTIN) 800 MG tablet Take 800 mg by mouth 3 (three) times daily.   lamoTRIgine (LAMICTAL) 150 MG tablet Take 150 mg by mouth daily.   nortriptyline (PAMELOR) 25 MG capsule TAKE 1 CAPSULE(25 MG) BY MOUTH AT BEDTIME   oseltamivir (TAMIFLU) 75 MG capsule Take 1 capsule (75 mg total) by mouth 2 (two) times daily.   traZODone (DESYREL) 50 MG tablet Take 50 mg by mouth at bedtime as needed for sleep.   triamcinolone cream (KENALOG) 0.1 % Apply 1 Application topically 2 (two) times daily.   valACYclovir (VALTREX) 1000 MG tablet TAKE 1 TABLET(1000 MG) BY MOUTH TWICE DAILY (Patient taking differently: 1,000 mg 2 (two) times daily. As needed)   No facility-administered encounter medications on file as of 06/24/2022.     There were no vitals filed for this visit. There is no height or weight on file to calculate BMI.   PHYSICAL EXAM GENERAL:alert, no distress and comfortable SKIN: no rash  EYES: sclera clear NECK: without mass LYMPH:  no palpable cervical or supraclavicular lymphadenopathy  LUNGS: clear with normal breathing effort HEART: regular  rate & rhythm, no lower extremity edema ABDOMEN: abdomen soft, non-tender and normal bowel sounds NEURO: alert & oriented x 3 with fluent speech, no focal motor/sensory deficits Breast exam:  PAC without erythema    CBC    Component Value Date/Time   WBC 6.5 01/21/2022 1004   WBC 5.6 12/25/2020 0820   RBC 5.04 01/21/2022 1004   HGB 16.1 (H) 01/21/2022 1004   HCT 46.8 (H) 01/21/2022 1004   PLT 255 01/21/2022 1004   MCV 92.9 01/21/2022 1004   MCH 31.9 01/21/2022 1004   MCHC 34.4 01/21/2022  1004   RDW 13.0 01/21/2022 1004   LYMPHSABS 1.3 01/21/2022 1004   MONOABS 0.6 01/21/2022 1004   EOSABS 0.1 01/21/2022 1004   BASOSABS 0.1 01/21/2022 1004     CMP     Component Value Date/Time   NA 139 11/23/2020 1049   K 3.8 11/23/2020 1049   CL 107 11/23/2020 1049   CO2 24 11/23/2020 1049   GLUCOSE 104 (H) 11/23/2020 1049   BUN 8 11/23/2020 1049   CREATININE 0.62 11/23/2020 1049   CALCIUM 8.8 11/23/2020 1049   PROT 7.2 11/23/2020 1049   ALBUMIN 4.4 11/23/2020 1049   AST 18 11/23/2020 1049   ALT 21 11/23/2020 1049   ALKPHOS 79 11/23/2020 1049   BILITOT 0.3 11/23/2020 1049   GFRNONAA >60 05/31/2017 2215   GFRAA >60 05/31/2017 2215     ASSESSMENT & PLAN:  PLAN:  No orders of the defined types were placed in this encounter.     All questions were answered. The patient knows to call the clinic with any problems, questions or concerns. No barriers to learning were detected. I spent *** counseling the patient face to face. The total time spent in the appointment was *** and more than 50% was on counseling, review of test results, and coordination of care.   Cira Rue, NP-C @DATE$ @

## 2022-06-24 ENCOUNTER — Inpatient Hospital Stay: Payer: Medicaid Other

## 2022-06-24 ENCOUNTER — Inpatient Hospital Stay: Payer: Medicaid Other | Attending: Hematology | Admitting: Nurse Practitioner

## 2022-08-27 ENCOUNTER — Encounter: Payer: Self-pay | Admitting: Hematology

## 2022-08-27 ENCOUNTER — Telehealth: Payer: Self-pay | Admitting: Hematology

## 2022-08-27 NOTE — Telephone Encounter (Signed)
Called patient to rescheduled missed appointment. Left message for patient to call back to reschedule the missed appointments.

## 2024-01-13 ENCOUNTER — Encounter: Payer: Self-pay | Admitting: Sports Medicine
